# Patient Record
Sex: Female | Born: 1980 | Race: Black or African American | Hispanic: No | Marital: Single | Smoking: Former smoker
Health system: Southern US, Community
[De-identification: ages and names within clinical notes are randomized; demographics above are authoritative.]

## PROBLEM LIST (undated history)

## (undated) DIAGNOSIS — O4690 Antepartum hemorrhage, unspecified, unspecified trimester: Secondary | ICD-10-CM

## (undated) DIAGNOSIS — Z8742 Personal history of other diseases of the female genital tract: Secondary | ICD-10-CM

## (undated) DIAGNOSIS — N870 Mild cervical dysplasia: Secondary | ICD-10-CM

## (undated) DIAGNOSIS — F411 Generalized anxiety disorder: Secondary | ICD-10-CM

## (undated) DIAGNOSIS — Z98891 History of uterine scar from previous surgery: Secondary | ICD-10-CM

## (undated) DIAGNOSIS — Z87448 Personal history of other diseases of urinary system: Secondary | ICD-10-CM

## (undated) DIAGNOSIS — B373 Candidiasis of vulva and vagina: Secondary | ICD-10-CM

## (undated) DIAGNOSIS — Z87440 Personal history of urinary (tract) infections: Secondary | ICD-10-CM

## (undated) DIAGNOSIS — E669 Obesity, unspecified: Secondary | ICD-10-CM

## (undated) DIAGNOSIS — Z8619 Personal history of other infectious and parasitic diseases: Secondary | ICD-10-CM

## (undated) DIAGNOSIS — IMO0002 Reserved for concepts with insufficient information to code with codable children: Secondary | ICD-10-CM

## (undated) DIAGNOSIS — 419620001 Death: Secondary | SNOMED CT

## (undated) DIAGNOSIS — R102 Pelvic and perineal pain: Secondary | ICD-10-CM

## (undated) DIAGNOSIS — Z9889 Other specified postprocedural states: Secondary | ICD-10-CM

## (undated) HISTORY — DX: Generalized anxiety disorder: F41.1

## (undated) HISTORY — DX: History of uterine scar from previous surgery: Z98.891

## (undated) HISTORY — DX: Obesity, unspecified: E66.9

## (undated) HISTORY — DX: Candidiasis of vulva and vagina: B37.3

## (undated) HISTORY — DX: Personal history of other diseases of urinary system: Z87.448

## (undated) HISTORY — DX: Personal history of other diseases of the female genital tract: Z87.42

## (undated) HISTORY — PX: OTHER SURGICAL HISTORY: SHX169

## (undated) HISTORY — DX: Personal history of other infectious and parasitic diseases: Z86.19

## (undated) HISTORY — DX: Death: 419620001

## (undated) HISTORY — DX: Personal history of urinary (tract) infections: Z87.440

## (undated) HISTORY — DX: Reserved for concepts with insufficient information to code with codable children: IMO0002

## (undated) HISTORY — DX: Antepartum hemorrhage, unspecified, unspecified trimester: O46.90

## (undated) HISTORY — DX: Pelvic and perineal pain: R10.2

## (undated) HISTORY — DX: Mild cervical dysplasia: N87.0

## (undated) HISTORY — DX: Other specified postprocedural states: Z98.890

## (undated) DEATH — deceased

---
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
## 2000-07-11 ENCOUNTER — Emergency Department (HOSPITAL_COMMUNITY): Admission: EM | Admit: 2000-07-11 | Discharge: 2000-07-11 | Payer: Self-pay | Admitting: Emergency Medicine

## 2000-09-10 DIAGNOSIS — Z87440 Personal history of urinary (tract) infections: Secondary | ICD-10-CM

## 2000-09-10 DIAGNOSIS — R102 Pelvic and perineal pain: Secondary | ICD-10-CM

## 2000-09-10 DIAGNOSIS — Z87448 Personal history of other diseases of urinary system: Secondary | ICD-10-CM

## 2000-09-10 HISTORY — DX: Personal history of urinary (tract) infections: Z87.440

## 2000-09-10 HISTORY — DX: Personal history of other diseases of urinary system: Z87.448

## 2000-09-10 HISTORY — DX: Pelvic and perineal pain: R10.2

## 2000-12-11 ENCOUNTER — Emergency Department (HOSPITAL_COMMUNITY): Admission: EM | Admit: 2000-12-11 | Discharge: 2000-12-11 | Payer: Self-pay | Admitting: Emergency Medicine

## 2000-12-11 ENCOUNTER — Encounter: Payer: Self-pay | Admitting: Emergency Medicine

## 2000-12-27 DIAGNOSIS — Z8619 Personal history of other infectious and parasitic diseases: Secondary | ICD-10-CM

## 2000-12-27 HISTORY — DX: Personal history of other infectious and parasitic diseases: Z86.19

## 2001-06-27 ENCOUNTER — Emergency Department (HOSPITAL_COMMUNITY): Admission: EM | Admit: 2001-06-27 | Discharge: 2001-06-27 | Payer: Self-pay | Admitting: Emergency Medicine

## 2001-09-29 ENCOUNTER — Inpatient Hospital Stay (HOSPITAL_COMMUNITY): Admission: AD | Admit: 2001-09-29 | Discharge: 2001-09-29 | Payer: Self-pay | Admitting: Obstetrics

## 2001-11-28 ENCOUNTER — Ambulatory Visit (HOSPITAL_COMMUNITY): Admission: RE | Admit: 2001-11-28 | Discharge: 2001-11-28 | Payer: Self-pay | Admitting: *Deleted

## 2002-04-03 ENCOUNTER — Inpatient Hospital Stay (HOSPITAL_COMMUNITY): Admission: AD | Admit: 2002-04-03 | Discharge: 2002-04-06 | Payer: Self-pay | Admitting: *Deleted

## 2003-02-17 ENCOUNTER — Inpatient Hospital Stay (HOSPITAL_COMMUNITY): Admission: AD | Admit: 2003-02-17 | Discharge: 2003-02-20 | Payer: Self-pay | Admitting: Obstetrics and Gynecology

## 2003-02-18 ENCOUNTER — Encounter: Payer: Self-pay | Admitting: Obstetrics and Gynecology

## 2003-02-21 ENCOUNTER — Emergency Department (HOSPITAL_COMMUNITY): Admission: EM | Admit: 2003-02-21 | Discharge: 2003-02-21 | Payer: Self-pay | Admitting: Emergency Medicine

## 2003-02-21 ENCOUNTER — Encounter: Payer: Self-pay | Admitting: Emergency Medicine

## 2003-02-22 ENCOUNTER — Inpatient Hospital Stay (HOSPITAL_COMMUNITY): Admission: AD | Admit: 2003-02-22 | Discharge: 2003-02-23 | Payer: Self-pay | Admitting: Family Medicine

## 2003-02-22 ENCOUNTER — Inpatient Hospital Stay (HOSPITAL_COMMUNITY): Admission: EM | Admit: 2003-02-22 | Discharge: 2003-02-22 | Payer: Self-pay | Admitting: Psychiatry

## 2003-02-22 ENCOUNTER — Encounter: Payer: Self-pay | Admitting: Obstetrics & Gynecology

## 2003-02-23 ENCOUNTER — Encounter: Payer: Self-pay | Admitting: Obstetrics and Gynecology

## 2003-02-23 ENCOUNTER — Inpatient Hospital Stay (HOSPITAL_COMMUNITY): Admission: EM | Admit: 2003-02-23 | Discharge: 2003-02-26 | Payer: Self-pay | Admitting: Psychiatry

## 2003-03-02 ENCOUNTER — Inpatient Hospital Stay (HOSPITAL_COMMUNITY): Admission: EM | Admit: 2003-03-02 | Discharge: 2003-03-04 | Payer: Self-pay | Admitting: Emergency Medicine

## 2003-03-23 ENCOUNTER — Encounter: Admission: RE | Admit: 2003-03-23 | Discharge: 2003-03-23 | Payer: Self-pay | Admitting: Internal Medicine

## 2003-10-14 ENCOUNTER — Other Ambulatory Visit: Admission: RE | Admit: 2003-10-14 | Discharge: 2003-10-14 | Payer: Self-pay | Admitting: Obstetrics and Gynecology

## 2003-12-09 ENCOUNTER — Other Ambulatory Visit: Admission: RE | Admit: 2003-12-09 | Discharge: 2003-12-09 | Payer: Self-pay | Admitting: Obstetrics and Gynecology

## 2004-07-06 ENCOUNTER — Other Ambulatory Visit: Admission: RE | Admit: 2004-07-06 | Discharge: 2004-07-06 | Payer: Self-pay | Admitting: Obstetrics and Gynecology

## 2004-08-27 ENCOUNTER — Emergency Department (HOSPITAL_COMMUNITY): Admission: EM | Admit: 2004-08-27 | Discharge: 2004-08-27 | Payer: Self-pay | Admitting: Emergency Medicine

## 2004-09-12 ENCOUNTER — Ambulatory Visit (HOSPITAL_COMMUNITY): Admission: RE | Admit: 2004-09-12 | Discharge: 2004-09-12 | Payer: Self-pay | Admitting: Obstetrics and Gynecology

## 2005-02-10 ENCOUNTER — Inpatient Hospital Stay (HOSPITAL_COMMUNITY): Admission: AD | Admit: 2005-02-10 | Discharge: 2005-02-12 | Payer: Self-pay | Admitting: Obstetrics & Gynecology

## 2006-03-29 DIAGNOSIS — Z8742 Personal history of other diseases of the female genital tract: Secondary | ICD-10-CM

## 2006-03-29 HISTORY — DX: Personal history of other diseases of the female genital tract: Z87.42

## 2006-03-31 ENCOUNTER — Emergency Department (HOSPITAL_COMMUNITY): Admission: EM | Admit: 2006-03-31 | Discharge: 2006-04-01 | Payer: Self-pay | Admitting: Emergency Medicine

## 2006-04-01 ENCOUNTER — Emergency Department (HOSPITAL_COMMUNITY): Admission: EM | Admit: 2006-04-01 | Discharge: 2006-04-02 | Payer: Self-pay | Admitting: Emergency Medicine

## 2006-04-06 ENCOUNTER — Emergency Department (HOSPITAL_COMMUNITY): Admission: EM | Admit: 2006-04-06 | Discharge: 2006-04-06 | Payer: Self-pay | Admitting: Emergency Medicine

## 2006-04-08 ENCOUNTER — Inpatient Hospital Stay (HOSPITAL_COMMUNITY): Admission: AD | Admit: 2006-04-08 | Discharge: 2006-04-15 | Payer: Self-pay | Admitting: Psychiatry

## 2006-04-09 ENCOUNTER — Ambulatory Visit: Payer: Self-pay | Admitting: Psychiatry

## 2006-04-29 ENCOUNTER — Encounter: Payer: Self-pay | Admitting: Emergency Medicine

## 2006-04-30 ENCOUNTER — Inpatient Hospital Stay (HOSPITAL_COMMUNITY): Admission: RE | Admit: 2006-04-30 | Discharge: 2006-05-13 | Payer: Self-pay | Admitting: *Deleted

## 2006-04-30 ENCOUNTER — Ambulatory Visit: Payer: Self-pay | Admitting: Psychiatry

## 2006-05-03 ENCOUNTER — Encounter: Payer: Self-pay | Admitting: *Deleted

## 2006-05-05 ENCOUNTER — Encounter: Payer: Self-pay | Admitting: *Deleted

## 2006-08-03 ENCOUNTER — Emergency Department (HOSPITAL_COMMUNITY): Admission: EM | Admit: 2006-08-03 | Discharge: 2006-08-03 | Payer: Self-pay | Admitting: Emergency Medicine

## 2006-08-10 DIAGNOSIS — B3731 Acute candidiasis of vulva and vagina: Secondary | ICD-10-CM

## 2006-08-10 DIAGNOSIS — B373 Candidiasis of vulva and vagina: Secondary | ICD-10-CM

## 2006-08-10 HISTORY — DX: Candidiasis of vulva and vagina: B37.3

## 2006-08-10 HISTORY — DX: Acute candidiasis of vulva and vagina: B37.31

## 2009-09-10 DIAGNOSIS — R87619 Unspecified abnormal cytological findings in specimens from cervix uteri: Secondary | ICD-10-CM

## 2009-09-10 DIAGNOSIS — IMO0002 Reserved for concepts with insufficient information to code with codable children: Secondary | ICD-10-CM

## 2009-09-10 HISTORY — DX: Unspecified abnormal cytological findings in specimens from cervix uteri: R87.619

## 2009-09-10 HISTORY — DX: Reserved for concepts with insufficient information to code with codable children: IMO0002

## 2010-03-21 ENCOUNTER — Emergency Department (HOSPITAL_COMMUNITY): Admission: EM | Admit: 2010-03-21 | Discharge: 2010-03-21 | Payer: Self-pay | Admitting: Emergency Medicine

## 2010-07-02 ENCOUNTER — Emergency Department (HOSPITAL_COMMUNITY): Admission: EM | Admit: 2010-07-02 | Discharge: 2010-07-02 | Payer: Self-pay | Admitting: Emergency Medicine

## 2010-07-07 ENCOUNTER — Emergency Department (HOSPITAL_COMMUNITY): Admission: EM | Admit: 2010-07-07 | Discharge: 2010-07-07 | Payer: Self-pay | Admitting: Emergency Medicine

## 2010-11-22 LAB — URINE CULTURE
Colony Count: NO GROWTH
Culture: NO GROWTH

## 2010-11-22 LAB — BASIC METABOLIC PANEL
BUN: 11 mg/dL (ref 6–23)
Calcium: 9.7 mg/dL (ref 8.4–10.5)
GFR calc non Af Amer: >60 mL/min (ref >60)
Potassium: 3.5 mEq/L (ref 3.5–5.1)
Sodium: 134 mEq/L — ABNORMAL LOW (ref 135–145)

## 2010-11-22 LAB — HCG, QUANTITATIVE, PREGNANCY: hCG, Beta Chain, Quant, S: 133721 m[IU]/mL — ABNORMAL HIGH (ref <5)

## 2010-11-22 LAB — URINALYSIS, ROUTINE W REFLEX MICROSCOPIC
Bilirubin Urine: NEGATIVE
Hgb urine dipstick: NEGATIVE
Protein, ur: NEGATIVE mg/dL
Urobilinogen, UA: 0.2 mg/dL (ref 0.0–1.0)

## 2010-11-22 LAB — DIFFERENTIAL
Basophils Absolute: 0 10*3/uL (ref 0.0–0.1)
Lymphocytes Relative: 37 % (ref 12–46)
Monocytes Absolute: 0.4 10*3/uL (ref 0.1–1.0)
Neutro Abs: 4.1 10*3/uL (ref 1.7–7.7)
Neutrophils Relative %: 56 % (ref 43–77)

## 2010-11-22 LAB — CBC
HCT: 35.5 % — ABNORMAL LOW (ref 36.0–46.0)
Hemoglobin: 12 g/dL (ref 12.0–15.0)
RDW: 13.7 % (ref 11.5–15.5)
WBC: 7.3 10*3/uL (ref 4.0–10.5)

## 2010-11-22 LAB — ABO/RH: ABO/RH(D): AB POS

## 2010-11-22 LAB — RAPID STREP SCREEN (MED CTR MEBANE ONLY): Streptococcus, Group A Screen (Direct): POSITIVE — AB

## 2011-01-11 ENCOUNTER — Observation Stay (HOSPITAL_COMMUNITY)
Admission: RE | Admit: 2011-01-11 | Discharge: 2011-01-11 | Disposition: A | Discharge status: Home / Self Care | Payer: Medicaid Other | Source: Ambulatory Visit | Attending: Obstetrics and Gynecology | Admitting: Obstetrics and Gynecology

## 2011-01-11 DIAGNOSIS — O321XX0 Maternal care for breech presentation, not applicable or unspecified: Principal | ICD-10-CM | POA: Insufficient documentation

## 2011-01-11 LAB — RPR: RPR Ser Ql: NONREACTIVE

## 2011-01-11 LAB — CBC
Hemoglobin: 11.7 g/dL — ABNORMAL LOW (ref 12.0–15.0)
MCH: 28 pg (ref 26.0–34.0)
MCHC: 32.4 g/dL (ref 30.0–36.0)
Platelets: 223 10*3/uL (ref 150–400)
RDW: 14.9 % (ref 11.5–15.5)

## 2011-01-18 NOTE — Op Note (Signed)
NAMESONDRA, BLIXT                 ACCOUNT NO.:  [id number]  MEDICAL RECORD NO.:  [id number]           PATIENT TYPE:  O  LOCATION:  9172                          FACILITY:  [facility]  PHYSICIAN:  Janine Limbo, M.D.DATE OF BIRTH:  12-08-1980  DATE OF PROCEDURE:  01/11/2011 DATE OF DISCHARGE:  01/11/2011                              OPERATIVE REPORT   PREOPERATIVE DIAGNOSES: 1. A 37 weeks' gestation (estimated date of confinement is Feb 01, 2011). 2. Breech presentation. 3. Body mass index equals 45.  POSTOPERATIVE DIAGNOSES: 1. A 37 weeks' gestation (estimated date of confinement is Feb 01, 2011). 2. Breech presentation. 3. Body mass index equals 45.  PROCEDURES: 1. Unsuccessful external version. 2. Ultrasound.  OBSTETRICIAN:  Janine Limbo, MD  FIRST ASSISTANT:  Renaldo Reel. Emilee Hero, CNM  ANESTHETIC:  None.  DISPOSITION:  Ms. Anagnos is a 30 year old female, gravida 6, para 2-0-3- 2, who presents at 11 weeks' gestation for external version because of a persistent breech presentation.  The patient has been followed at the [facility] and Gynecology Division of [facility].  The patient understands the indications for her surgical procedure as well as her alternative treatment options.  She accepts the risks that there will be fetal stress and that there is the possibility we may need to proceed with cesarean section today.  She also accepts the risk that external version will be unsuccessful.  FINDINGS:  The patient's blood type is AB positive.  An ultrasound was performed that showed a single intrauterine gestation and a breech presentation.  The head was located in the right upper quadrant.  The amniotic fluid volume was normal.  The placenta is a grade 2 and is located at the fundus.  Fetal heart motions were normal.  Nonstress test was reactive.  The patient's body mass index equals 45.  DESCRIPTION OF PROCEDURE:  The  patient was seen in Labor and Delivery. IV access was established.  The patient was given subcutaneous terbutaline.  The nonstress test was reactive.  We reviewed the indications for an attempted external version and we talked about cesarean delivery in the face of fetal stress.  An ultrasound was performed with findings as mentioned above.  Gel was placed in the patient's abdomen.  Two attempts were made to rotate the infant in a counterclockwise direction.  These were unsuccessful.  Two attempts were then made to rotate the infant in a clockwise direction.  These attempts were also unsuccessful.  The patient was monitored with the ultrasound and the fetal heart motions were noted to be stable throughout.  After all 4 attempts, the decision was made to discontinue any further attempts.  The patient was placed back on the monitor and again the nonstress test was noted to be reactive.  The patient did not have any bleeding or leakage of fluid.  FOLLOWUP PLANS:  The patient will take Tylenol 1 or 2 every 6 hours as needed for discomfort.  We discussed scheduling a cesarean section because of our concerns for vaginal breech delivery.  The patient will follow up in the office in 1 week.  She will call for questions or concerns.     Janine Limbo, M.D.     AVS/MEDQ  D:  01/11/2011  T:  01/11/2011  Job:  [id number]  Electronically Signed by Kirkland Hun M.D. on 01/18/2011 03:22:07 AM

## 2011-01-19 ENCOUNTER — Other Ambulatory Visit: Payer: Self-pay | Admitting: Obstetrics and Gynecology

## 2011-01-19 ENCOUNTER — Encounter (HOSPITAL_COMMUNITY): Payer: Medicaid Other

## 2011-01-19 LAB — SURGICAL PCR SCREEN
MRSA, PCR: NEGATIVE
Staphylococcus aureus: NEGATIVE

## 2011-01-19 LAB — CBC
HCT: 34.5 % — ABNORMAL LOW (ref 36.0–46.0)
MCHC: 31.6 g/dL (ref 30.0–36.0)
Platelets: 207 10*3/uL (ref 150–400)
RDW: 14.9 % (ref 11.5–15.5)
WBC: 8.2 10*3/uL (ref 4.0–10.5)

## 2011-01-19 LAB — RPR: RPR Ser Ql: NONREACTIVE

## 2011-01-24 NOTE — H&P (Signed)
  NAMEKHAMILA, BASSINGER                 ACCOUNT NO.:  [id number]  MEDICAL RECORD NO.:  [id number]           PATIENT TYPE:  LOCATION:                                 FACILITY:  PHYSICIAN:  Janine Limbo, M.D.DATE OF BIRTH:  Feb 05, 1981  DATE OF ADMISSION:  01/26/2011 DATE OF DISCHARGE:                             HISTORY & PHYSICAL   HISTORY OF PRESENT ILLNESS:  Ms. Degrasse is a 30 year old female, gravida 6, para 2-0-3-2, who presents at 39 weeks and 1 day gestation (EDC is Feb 01, 2011).  The patient has been followed at the [facility] and Gy[facility]cology Division [facility]. The pregnancy has been complicated by the fact that the patient has a breech infant.  External version was attempted but was not successful.  DRUG ALLERGIES:  No known drug allergies.  OBSTETRICAL HISTORY:  The patient had a vaginal delivery in 2003 at term.  She had a vaginal delivery in 2006 at term.  She has had 1 miscarriage and 2 elective pregnancy terminations.  PAST MEDICAL HISTORY:  The patient is obese.  She denies hypertension and diabetes.  SOCIAL HISTORY:  The patient denies cigarette use, alcohol use, and recreational drug use.  REVIEW OF SYSTEMS:  Normal pregnancy complaints.  FAMILY HISTORY:  Noncontributory.  PHYSICAL EXAMINATION:  VITAL SIGNS:  Weight is 268 pounds, height is 5 feet 5 inches. HEENT:  Within normal limits. CHEST:  Clear. HEART:  Regular rate and rhythm. BREASTS:  Without masses. ABDOMEN:  Gravid with a fundal height of 39 cm.  Infant is in a breech presentation with the head in the right upper quadrant. EXTREMITIES:  Within normal limits. NEUROLOGIC:  Grossly normal. PELVIC:  Cervix was closed and long when last checked.  LABORATORY VALUES:  Blood type is AB positive, antibody screen negative, sickle cell negative, VDRL nonreactive, rubella immune, HBsAg negative, HIV is nonreactive.  Third trimester gonorrhea negative.   Third trimester Chlamydia is negative. Beta Strep is negative.  ASSESSMENT: 1. A 39-week and 1-day gestation. 2. Breech presentation. 3. Obesity.  PLAN:  The patient will undergo a primary low transverse cesarean section.  She requested Nexplanon for contraception.  She understands the indications for her surgical procedure and she accepts the risks of, but not limited to, anesthetic complications, bleeding, infections, and possible damage to surrounding organs.     Janine Limbo, M.D.     AVS/MEDQ  D:  01/22/2011  T:  01/23/2011  Job:  [id number]  Electronically Signed by Kirkland Hun M.D. on 01/24/2011 08:48:55 AM

## 2011-01-26 ENCOUNTER — Inpatient Hospital Stay (HOSPITAL_COMMUNITY)
Admission: RE | Admit: 2011-01-26 | Discharge: 2011-01-29 | DRG: 766 | Disposition: A | Discharge status: Home / Self Care | Payer: Medicaid Other | Source: Ambulatory Visit | Attending: Obstetrics and Gynecology | Admitting: Obstetrics and Gynecology

## 2011-01-26 DIAGNOSIS — Z01812 Encounter for preprocedural laboratory examination: Secondary | ICD-10-CM

## 2011-01-26 DIAGNOSIS — O9903 Anemia complicating the puerperium: Secondary | ICD-10-CM | POA: Diagnosis not present

## 2011-01-26 DIAGNOSIS — E669 Obesity, unspecified: Secondary | ICD-10-CM | POA: Diagnosis present

## 2011-01-26 DIAGNOSIS — O321XX0 Maternal care for breech presentation, not applicable or unspecified: Principal | ICD-10-CM | POA: Diagnosis present

## 2011-01-26 DIAGNOSIS — Z01818 Encounter for other preprocedural examination: Secondary | ICD-10-CM

## 2011-01-26 DIAGNOSIS — D649 Anemia, unspecified: Secondary | ICD-10-CM | POA: Diagnosis not present

## 2011-01-26 NOTE — H&P (Signed)
[facility]  Patient:    [name], [name] Visit Number: [id number] MRN: [id number]          Service Type: OBS Location: 910B 9162 01 Attending Physician:  Jaymes Graff A Dictated by:   Nigel Bridgeman, C.N.M. Admit Date:  04/03/2002                           History and Physical  HISTORY OF PRESENT ILLNESS:   The patient is a 30 year old gravida 3, para 0-0-2-0 at 67 weeks who presented with uterine contractions every five minutes since approximately 4 a.m.  She also began to have a small amount of leaking upon presentation to the hospital.  Pregnancy has been remarkable for #1: Positive group B strep.  2. TABs.  3. History of asthma.  4. Late to care beginning care at 24 weeks.  5. History of Chlamydia, second trimester.  PRENATAL LABORATORIES:        Blood type is AB positive, Rh antibody negative, VDRL nonreactive, rubella titer positive, hepatitis B surface antigen negative,  HIV nonreactive, sickle cell test negative.  GC and Chlamydia cultures were negative.  Pap was normal.  Glucose challenge was normal.  AFP was not done secondary to patient being too late for care.  Hemoglobin upon entering the practice was 12.  It was 10.8 at 27 weeks.  Group B strep culture was positive at 36 weeks.  EDC of April 17, 2002, was established by ultrasound at approximately 23 weeks secondary to late to care.  Her rubella is nonimmune.  HISTORY OF PRESENT PREGNANCY:                    Patient entered care at [facility] at approximately 25 weeks.  She was treated with Rocephin and Zithromax for cervicitis at [facility].  She had an ultrasound at 25 weeks.  She had a Glucola that was normal.  She had another ultrasound at 29 weeks that showed normal growth.  There was limited brain anatomy noted on both ultrasounds. Patient declined this.  Group B strep culture was positive at 36 weeks.  The rest of her pregnancy was essentially  uncomplicated.  OBSTETRICAL HISTORY:          In 1998, she had a miscarriage.  In May 2002, she had a therapeutic termination of pregnancy at three months at [facility].  MEDICAL HISTORY:              She was a condom user.  She has also been an OCP user.  She was diagnosed with asthma in high school.  She has had no current attacks.  She has a history of previous smoking but stopped prior to pregnancy.  She does have a history of pyelonephritis or UTI in the past.  GENETIC HISTORY:              Unremarkable.  SOCIAL HISTORY:               Patient is single.  She has an 11th grade education.  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  She is of the African-American culture.  She is supported by her family.  Her mother, and her partner, [name]. [name], are present with her today.  FAMILY HISTORY:               Paternal grandfather had a stroke.  PHYSICAL EXAMINATION:  VITAL SIGNS:                  Stable.  Patient is afebrile.  HEENT:                        Within normal limits.  LUNGS:                        Bilateral breath sounds are clear.  HEART:                        Regular rate and rhythm without murmur.  BREASTS:                      Soft and nontender.  ABDOMEN:                      Fundal height is approximately 38 cm.  Estimated fetal weight 7 to 7-1/2 pounds.  Uterine contractions are every 5 minutes, mild to moderate quality.  Cervical exam on admission was 2 cm, 80%, vertex, -1, -2, posterior, with questionable membranes felt.  There was a speculum exam done with a small amount of fluid in the vault.  This was negative fern and negative nitrazine initially.  Then, the patient walked for an hour after which time her cervix changed to 3 cm and there was positive ferning noted on exam.  Electronic fetal monitoring reveals reactive fetal heart rate tracing with a negative spontaneous CST and occasional mild variables.  EXTREMITIES:                   Deep tendon reflexes are 2+ without clonus. There is a trace edema noted.  IMPRESSION:                   1. Intrauterine pregnancy at 38 weeks.                               2. Spontaneous rupture of membranes since                                  approximately 8 a.m.                               3. Positive group B strep.                               4. Rubella nonimmune.  PLAN:                         1. Admit to birthing suite for consult with                                  Dr. Jaymes Graff as attending physician.                               2. Routine certified nurse midwife orders.  3. Plan group B strep prophylaxis with                                  penicillin G per standard dosing.                               4. Patient declines epidural but will want to                                  use IV pain medications as appropriate. Dictated by:   Nigel Bridgeman, C.N.M. Attending Physician:  Michael Litter DD:  04/03/02 TD:  04/03/02 Job: [id number]

## 2011-01-26 NOTE — H&P (Signed)
[name], [name]                 ACCOUNT NO.:  [id number]   MEDICAL RECORD NO.:  [id number]          PATIENT TYPE:  INP   LOCATION:  9164                          FACILITY:  [facility]   PHYSICIAN:  Crist Fat. Rivard, M.D. DATE OF BIRTH:  [birth year]   DATE OF ADMISSION:  02/10/2005  DATE OF DISCHARGE:                                HISTORY & PHYSICAL   This is a 30 year old gravida 4, para 1-0-2-1, at 39-4/7 weeks, who presents  with rupture of membranes at 7 a.m. with contractions occurring at the same  time.  Pregnancy has been followed by the nurse midwife service and  remarkable for (1) Abnormal Pap.  (2) First trimester spotting.  (3) Unsure  dates.  (4) First trimester UTI.  (5) Asthma.  (6) Increased BMI.  (7)  History of pyelo.  (8) Positive group B strep.   OBSTETRICAL HISTORY:  1.  Remarkable for a spontaneous abortion in 1998 at [redacted] weeks gestation.  2.  An elective abortion in 2002 at [redacted] weeks gestation.  3.  Vaginal delivery in 2003 of a female infant at [redacted] weeks gestation,      weighing 6 pounds, 1 ounce, remarkable for meconium.   MEDICAL HISTORY:  1.  Anemia with last pregnancy.  2.  History of Chlamydia in 2002.  3.  Low-grade SIL in 2005 with normal Pap afterwards.  4.  Exercise-induced asthma.   SURGICAL HISTORY:  1.  An elective AB in 2002.  2.  I&D of a boil in 2002.   FAMILY HISTORY:  Remarkable for a grandfather with diabetes and colon  cancer.   GENETIC HISTORY:  Unremarkable.   SOCIAL HISTORY:  The patient is single.  Father of the baby, Landry Mellow,  is involved and supportive.  She is of the Jehovah's Witness faith.  She  denies any alcohol, tobacco, or drug use.   PRENATAL LABS:  Hemoglobin 11.3, platelets 270, blood type AB positive,  antibody screen negative, sickle cell negative, RPR nonreactive, rubella  immune, hepatitis negative, HIV negative, Pap test normal, gonorrhea  negative, Chlamydia negative, cystic fibrosis, negative, group B strep  positive.   HISTORY OF CURRENT PREGNANCY:  The patient entered care at [redacted] weeks  gestation.  She had a first trimester UTI that was treated.  She had normal  18 week ultrasound.  Glucola was normal at 26 weeks, and group B strep was  positive at 35 weeks.   OBJECTIVE DATA:  VITAL SIGNS:  Stable and afebrile.  HEENT:  Within normal limits.  Thyroid normal and not enlarged.  CHEST:  Clear to auscultation.  HEART:  Regular rate and rhythm.  ABDOMEN:  Gravid at 39 cm, vertex Leopold's, EFM shows reactive fetal heart  rate with contractions every 2 minutes.  Cervix is 3, 90, -1, with vertex  presentation, clear fluid leaking, positive Nitrazine.  EXTREMITIES:  Within normal limits.   ASSESSMENT:  1.  Intrauterine pregnancy at 39-4/7 weeks.  2.  Spontaneous rupture of membranes.  3.  Active labor.   PLAN:  1.  Admit to birthing suites.  2.  Routine CNM orders.  3.  Penicillin prophylaxis.  4.  Further orders to follow.      MLW/MEDQ  D:  02/10/2005  T:  02/10/2005  Job:  [id number]

## 2011-01-26 NOTE — H&P (Signed)
NAMECHRISANN, [name]                           ACCOUNT NO.:  [id number]   MEDICAL RECORD NO.:  [id number]                   PATIENT TYPE:  IPS   LOCATION:  0501                                 FACILITY:  [facility]   PHYSICIAN:  Jeanice Lim, M.D.              DATE OF BIRTH:  Oct 07, 1980   DATE OF ADMISSION:  02/23/2003  DATE OF DISCHARGE:  02/26/2003                         PSYCHIATRIC ADMISSION ASSESSMENT   IDENTIFYING INFORMATION:  This is a 30 year old African-American female  voluntarily admitted on February 23, 2003.   HISTORY OF PRESENT ILLNESS:  The patient was a transfer from [facility].  Was assessed for depression and abdominal pain and nausea.  Is  status post abortion one week ago which was elective.  She returned to  [facility] for symptoms of depression and anxiety.  States  that she has had an extensive workup for her abdominal pain with negative  results, continuing with nausea, vomiting, abdominal pain and continual  attention.  She denies any depressive symptoms, suicidal ideation or  psychosis.   PAST PSYCHIATRIC HISTORY:  Was admitted to [facility] three  years ago for her nerves.  Denies any auditory or visual hallucinations,  suicidal ideation or outpatient treatment.   SOCIAL HISTORY:  This is a 30 year old single African-American female.  Has  a 32-month-old son.  She lives with her mother.  She has had a previous  abortion at age 60 and had a recent abortion a week ago for financial  reasons.  She worked previously in [employer].  States her mother  supports her financially.   FAMILY HISTORY:  Negative.   ALCOHOL/DRUG HISTORY:  Smokes two cigarettes a day.  Her last alcohol drink  was a month ago.  She does report some drinking, up to a pint of liquor  daily.   PRIMARY CARE PHYSICIAN:  None.   MEDICAL PROBLEMS:  Status post abortion one week ago with continual  complaints of nausea, vomiting and abdominal  pain.   MEDICATIONS:  Cipro 250 mg p.o. q.8h., Phenergan 12.5 mg p.o. q.4h. p.r.n.,  Stadol 1 mg IM q.3h. p.r.n., Toradol 30 mg IM q.6h. p.r.n. for pain.   ALLERGIES:  No known allergies.   PHYSICAL EXAMINATION:  Done at [facility].  The patient walks very  stooped over due to abdominal pain.  Her hCG quantitative was 350 down to  54.  Amylase was 55.  Her CMET was within normal limits.   MENTAL STATUS EXAM:  She is alert.  She is bent over.  She is complaining of  abdominal pain.  Refusing to sit.  Wears a hospital gown.  Speech is clear.  Mood:  The patient reports she is in pain.  Affect flat.  Attention-seeking.  Thought processes are focused on her pain.  Requires bedside attention.  Cognitively, she is oriented.  Memory is intact.  Poor insight.  Fair  judgment.  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  2. Rule out anxiety disorder not otherwise specified.   AXIS II:  Deferred.   AXIS III:  Status post elective abortion.   AXIS IV:  Problems with primary support group, occupation, economic and  other psychosocial problems.   AXIS V:  Current 35; this past year 34.   PLAN:  Voluntary admission for depression and anxiety.  Contract for safety.  Check every 15 minutes.  Will stabilize mood and thinking so patient can be  safe.  Increase coping skills by attending groups.  Will monitoring food and  fluid intake.  Will monitor labs as well for dehydration.  Will initiate  Seroquel for anxiety and mood lability.  Will consider a family session with  the mother.  Will provide patient information as well on family planning.   TENTATIVE LENGTH OF STAY:  Three to five days.     Landry Corporal, N.P.                       Jeanice Lim, M.D.    JO/MEDQ  D:  03/19/2003  T:  03/19/2003  Job:  [id number]

## 2011-01-26 NOTE — Discharge Summary (Signed)
NAMEJESICCA, [name]                 ACCOUNT NO.:  [id number]   MEDICAL RECORD NO.:  [id number]          PATIENT TYPE:  IPS   LOCATION:  0400                          [facility]  [facility]   PHYSICIAN:  Jasmine Pang, M.D. DATE OF BIRTH:  [birth year]   DATE OF ADMISSION:  04/30/2006  DATE OF DISCHARGE:  05/13/2006                                 DISCHARGE SUMMARY   IDENTIFYING INFORMATION:  The patient is a 30 year old, single, African-  American female, who was admitted on a voluntary basis on April 30, 2006,  to my service.   HISTORY OF PRESENT ILLNESS:  The patient has a history of psychosis with  auditory hallucinations.  She states they have been command hallucinations,  telling her to go on.  She reports compliance with medications.  She  denies stressors.  She has been nauseated times two days and not eating  times two days.  She denies suicidal and homicidal ideations.  She denies  current psychosis.  The patient states, my nerves triggered back on me.  She had started hearing voices telling her to kill herself.  She has not  been sleeping or eating.  This is her second [facility] admission for this patient.  For further admission information, see psychiatric admission assessment.   PHYSICAL EXAMINATION:  The patient's physical exam was done by her nurse  practitioner, Landry Corporal, N.P.  She was found to be a healthy, young  female with no acute medical problems.   ADMISSION LABORATORIES:  CBC was within normal limits.  Urine pregnancy test  was negative.  Glucose 104.  Urinalysis:  7-10 WBC.  Urine drug screen was  positive for barbiturates and benzodiazepines.   HOSPITAL COURSE:  Upon admission, the patient was continued on Zoloft 25 mg  p.o. daily, her home medication.  She was also placed on Ativan 2 mg p.o. or  IM now, given her agitation, then 2 mg p.o. or IM every 4 hours p.r.n.  Zyprexa Zydis 10 mg p.o. at bedtime, Zyprexa Zydis 5 mg p.o. every 6 hours  p.r.n. agitation,  Phenergan 25 mg IM now, then Phenergan 25 mg p.o. or IM  every 4 hours p.r.n. nausea.  On May 01, 2006, the patient was started on  Cogentin 1 mg p.o. twice daily p.r.n. EPS and trazodone 50 mg p.o. at  bedtime p.r.n. insomnia, may repeat times one.  She was also started on  Cipro 200 mg p.o. twice daily times three days for her UTI.  On May 02, 2006, the patient was started on Seroquel 200 mg now, may repeat in one hour  if patient is still restless.  On May 02, 2006, patient was given  Benadryl 50 mg IM now for EPS.  On May 04, 2006, patient was started on  Protonix 40 mg daily.  A urine probe for GC and Chlamydia was ordered  because she had been positive for this in the emergency room.  This was  negative.  Due to her continued GI pain, an abdominal ultrasound was done.  This was an unremarkable abdominal ultrasound examination.  It was felt the  abdominal pain was probably due to her UTI.  On May 04, 2006, the patient  had to be placed on one-to-one for increased agitation and inability to  process and being a fall risk.  On May 04, 2006, Zyprexa was  discontinued, Zoloft was discontinued due to her increasing problems with  EPS and confusion.  On May 04, 2006, Benadryl 50 mg IM times one was  given, Ativan 0.5 mg p.o. three times a day at bedtime was given.  On May 04, 2006, Benadryl 50 mg times one now was given for EPS.  On May 05, 2006, the patient was started on Bentyl 10 mg p.o. three times a day p.r.n.  GI distress after her abdominal ultrasound was found to be normal and Zofran  4 mg p.o. or IM every 6 hours p.r.n.  Her Ativan was also changed to 0.5 mg  p.o. three times a day p.r.n. anxiety.  On May 07, 2006, the patient was  started back on Zydis 5 mg now, then every 6 hours p.r.n. anxiety and  agitation.  She was also started on a standing dose of Zyprexa Zydis 5 mg  p.o. at bedtime.  She was started on Ativan 1 mg now.  The patient  tolerated  these medications well with no significant side effects.   Upon first meeting patient, she states, my nerves triggered back on me.  She was hearing voices, telling her to kill herself.  She had not been  sleeping (none in the past three days).  She did not eat lunch and  apparently has not been eating lately.  Affect was flat and constricted.  There was psychomotor agitation.  There was positive suicidal ideation,  stab myself.  She contracted for safety here.  On May 02, 2006, the  patient was sitting in bed, hunched over.  At first, she did not want to  talk.  She covered herself with a blanket.  Later she talked more.  She  stated she was having positive auditory hallucinations, telling her to pay  up.  She states she is anxious also.  There were signs of motor agitation.  She was very restless.  She was having a hard time settling down.   On May 03, 2006, the patient continued to be very restless and fidgety.  She did not sleep.  She asked me, is this a chemical imbalance in my  brain?  She was in a lot of distress and was not sure she was going to get  better.  She was advised that she would indeed get better with treatment.  She stated she felt like her legs would not stop moving.  She was contorting  in bed to different postures.  She stated she hears voices that play games  and tricks and stuff.  They are very derogatory towards her.  On May 05, 2006, the patient required the emergency room for recurrent abdominal pain.  She had bloody-tinged emesis.  A CT scan was done of her abdomen, which was  within normal limits.  It was recommended she use Zofran for her nausea and  Bentyl for the GI distress.  She returned, resting comfortably.  The patient  was also on one-to-one from August 25 to August 26, due to her continued  agitation, inability to process and concern about being a fall risk.  On May 06, 2006, the patient was feeling better.  She stated she  liked  her new meds.  She was started on just Ativan.  The Zoloft and Zyprexa had  been discontinued, due to excessive agitation and EPS.  I decided to restart  a small dose of Zyprexa, because she has not required further treatment with  Benadryl or Cogentin.  I was going to monitor closely for EPS.  On August  28, 200, the patient was lying in bed.  She states she had been up all  night.  The medicine aint working.  She had significant psychomotor  retardation.  She feels like the meds are not helping her nerves.  She is  experiencing sedation.  On May 08, 2008, the patient was still improving.  She sees the improvement in herself.  She was able to converse more  appropriately with good eye contact.  Case manager talked with her mother,  who is very supportive.  She wants [name] well and states she can return  home.  On May 09, 2006, I talked with [name] about her positive GC and  Chlamydia test in the ED.  She was anxious because she felt she may have  contracted again, since she had sex with the same man after the ED visit,  even though she was treated at the ED.  She is quite somatically  preoccupied.  She had a history of conversion disorder in the past.  I  repeated the urine GC and Chlamydia DNA probe and it was  negative.   On May 10, 2006, the patient was anxious.  She wanted to know if she had  GC and Chlamydia.  She felt like she was having the symptoms of burning.  She remained preoccupied with this.  On May 11, 2006, the patient was  alert and cooperative with appropriate affect.  She was able to describe  triggers for her discomfort.  She seems to have little or no insight into  her own illness or behavior.  Her mother is also perplexed.  We considered  discharge on May 12, 2006.  This was canceled, however, because she  thought she needed another day, as she was still feeling anxious.   On May 13, 2006, the patient's affect had improved markedly.  She  had  good eye contact.  She was conversant, friendly, cooperative.  Her speech  was normal rate and flow.  Psychomotor activity was within normal limits.  Mood was less depressed and anxious, though there was still some anxiety.  Affect was wide range.  There was no suicidal and homicidal ideation, no  self-injurious behavior ideation.  There were no auditory or visual  hallucinations, no paranoia or delusions, thoughts were logical and goal-  directed.  Thought content:  Very happy to hear that she did not have  gonorrhea or Chlamydia.  Cognitive exam was grossly within normal limits.   The patient felt ready to go home.  She was going back to live with her  mother and her children.  Mother was very supportive and felt ready to take  the patient home.   DISCHARGE DIAGNOSES:  AXIS I.  Psychotic disorder, NOS (rule out  schizoaffective disorder versus schizophrenia).  AXIS II.  None.  AXIS III. 1. Recent urinary tract infection, which was treated.  2. Recent gastrointestinal discomfort, which was evaluated thoroughly.  AXIS IV.  Severe (problems with primary support group, other psychosocial  problems, medical problems, economic problems).  AXIS V:  GAF upon discharge was 50.  GAF upon admission was 30.  GAF  highest,  past year, was 68.   DISCHARGE PLANS:  There were no specific activity level or dietary  restrictions.   DISCHARGE MEDICATIONS:  1. The patient was discharged on Protonix 40 mg daily or may use Pepcid 20      mg daily (since Medicaid will not pay for Protonix).  2. Zyprexa 5 mg p.o. at bedtime.  3. Trazodone 50 mg p.o. at bedtime p.r.n. insomnia.  4. Hydroxyzine 25 mg one pill p.o. every 6 hours p.r.n. anxiety.   POST-HOSPITAL CARE PLANS:  The patient will go to the [facility] on  Thursday, May 16, 2006, at 10:30 a.m. for followup medication  management.      Jasmine Pang, M.D.  Electronically Signed     BHS/MEDQ  D:  05/13/2006  T:   05/13/2006  Job:  [id number]

## 2011-01-26 NOTE — Discharge Summary (Signed)
NAMEBRINLY, Ebony                           ACCOUNT NO.:  [id number]   MEDICAL RECORD NO.:  [id number]                   PATIENT TYPE:  INP   LOCATION:  5725                                 FACILITY:  [facility]   PHYSICIAN:  [name], M.D.                    DATE OF BIRTH:  01/24/1981   DATE OF ADMISSION:  03/02/2003  DATE OF DISCHARGE:  03/04/2003                                 DISCHARGE SUMMARY   DISCHARGE DIAGNOSES:  1. Nausea and vomiting, resolved.  2. Abdominal pain, resolved.  3. Probable depression.  4. Status post elective abortions.  5. Hypokalemia, resolved.   DISCHARGE MEDICATIONS:  Paxil 20 mg p.o. daily.   CONDITION ON DISCHARGE:  The patient was discharged in excellent condition.  She was able to tolerate oral feeding without problems.   DISCHARGE INSTRUCTIONS:  She was instructed to follow up with [facility] on 03/04/03 at 10 a.m.  She was instructed to follow up in  the [facility] outpatient clinic on 03/23/03 at 2:15 p.m. with Dr. Lianne Jensen.   BRIEF ADMITTING HISTORY AND PHYSICAL EXAMINATION:  [name] is a 30-year-  old African-American female with no significant past medical history who  underwent an elective abortion 17 days prior to admission at [facility].  After the abortion she had recurrent nausea,  vomiting and abdominal pain.  She was evaluated for possible retention of  fetal products, endometritis, uterine perforation, but she was found to have  non of these.  She was imaged with abdominal ultrasound, computed tomography  of her abdomen and pelvis but no major cause was identified.  She was  followed with serial HCG levels which were all trending down after the  abortion.  The patient has also been hospitalized at [facility] for depression  and she had an appointment with [facility] scheduled before  this hospital admission.  Apparently she has been having a week's worth of  progressive nausea, vomiting and abdominal pain with no bowel movement for  several days.  No melena or hematochezia.  No dysuria and no hematuria.   PHYSICAL EXAMINATION ON ADMISSION:  VITAL SIGNS: Temperature 98.3, pulse 95,  respiration 22, Jensen pressure 133/106, saturating 98% on room air.  GENERAL: On examination she was sedated and difficult to interview.  NECK: Supple.  LUNGS: Clear.  HEART: Regular.  ABDOMEN: Supple without rebound tenderness or guarding.  She had positive  bowel sounds.  EXTREMITIES: Without edema, good pulses.  NEUROLOGIC: Exam was nonfocal.   LABORATORY DATA:  Admission urinalysis was negative for nitrite, had 3-6  wbc's, potassium was 2.7, sodium was 173, chloride 97, bicarbonate 23.  Her  alcohol level was less than 5.  Protein level was 3.5 and she had normal  liver function tests.  Creatinine was 1 and BUN was 8.  She had a normal  complete Jensen count and a normal lipase.   HOSPITAL COURSE:  PROBLEM 1.  Nausea and vomiting.  This was a psychogenic  event.  We treated her symptomatically and kept her without diet for 12  hours.  Then we gradually restarted her diet and with the aid of Compazine  p.r.n. we achieved perfect control of her nausea and vomiting.  By the time  of discharge the patient was able to tolerate oral feedings without  problems.  PROBLEM 2.  Hypokalemia secondary to nausea and vomiting.  She had her  potassium replaced adequately and there was no other rebound of her  hypokalemia in the hospital.  PROBLEM 3.  Depression with hospitalization as above.  She has an intake  interview with __________ mental health and we started her on  antidepressants.  She will be followed at the [facility].  PROBLEM 4.  Unplanned pregnancy.  We have advised patient to use  contraceptives from now on and have protected sex.  She had been tested in  the past specifically for gonorrhea and chlamydia, trichomonas and she was  negative  for all of those.   LABORATORY DATA AT TIME OF DISCHARGE:  Sodium 134, potassium 3.5, chloride  99, bicarbonate ___ and magnesium 6.3, BUN 3, creatinine 0.9, glucose 118,  potassium 9.  WBC was 6900, hemoglobin 13.8, hematocrit 20.8 and platelets  259,000.                                               [name], M.D.    SL/MEDQ  D:  03/04/2003  T:  03/06/2003  Job:  [id number]   cc:   [name], M.D.  Int. Med. - Resident - [address]  [place], [place] [address]  Fax: [phone]    cc:   [name], M.D.  Int. Med. - Resident - [address]  [place], [place] [address]  Fax: [phone]

## 2011-01-26 NOTE — Discharge Summary (Signed)
NAMEGERYL, DOHN                 ACCOUNT NO.:  [id number]   MEDICAL RECORD NO.:  [id number]          PATIENT TYPE:  IPS   LOCATION:  0305                          [facility]ILITY[facility]  [facility]   PHYSICIAN:  Anselm Jungling, MD  DATE OF BIRTH:  1980-10-18   DATE OF ADMISSION:  04/08/2006  DATE OF DISCHARGE:  04/13/2006                                 DISCHARGE SUMMARY   IDENTIFYING DATA/REASON FOR ADMISSION:  The patient is a 30 year old single  African American female who reported that she had increasing anxiety,  agitation, and suicidal ideation following a recent medical procedure  emergency room visit at [facility].  Please refer to the  admission note for further details pertaining to the symptoms, circumstances  and history that led to her hospitalization.   INITIAL DIAGNOSTIC IMPRESSION:  She was given an initial AXIS I diagnosis of  psychosis not otherwise specified, rule out.   MEDICAL/LABORATORY:  The patient was medically and physically assessed by  the psychiatric nurse practitioner.  She was in good health without any  active or chronic medical problems.  There were mild elevations of AST which  normalized on the second hospital day, and a very slight elevation of ALT at  45.  Otherwise, CBC and chemistry panel were within normal limits.  There  were no significant medical issues.   HOSPITAL COURSE:  The patient was admitted to the adult inpatient  psychiatric service.  She presented as a well-nourished, well-developed  female who was initially interviewed in her bed.  She was groggy and unable  to give much history on that day, and it was difficult to assess her mental  status.  She was not particularly cooperative either.   On the following day, however, the patient was up, dressed, and active in  the milieu.  She appeared a bit sleepy and sedated, but she denied feeling  that way.  She stated that she felt much better than when I came here.  She discussed  her views on how her various stressors at home contributed to  the crisis that brought her to [place].  She had been mildly febrile, but the  nurse practitioner had been tracking her vital signs and she was not febrile  on the third hospital day.   The patient was begun on a trial of low-dose Risperdal, and this appeared to  hasten the clearing of cognitive and mental symptoms that had led to her  hospitalization and that had been noted in part upon admission.   On the fourth hospital day, the patient had a brief episode characterized by  histrionic affect, writhing on her bed.  This was witnessed by the nurse  practitioner directly, who felt it was clearly not seizure activity, but  more likely some form of conversion phenomenon.  The day following, the  patient was up, active in the milieu and pleasant.  When asked about  yesterday's episode, the patient stated that she believed it was due to  something I ate.  She reported that, on that day, she felt fine, much  better,  with no complaints.  She made no statements suggestive of  delusionality.  However, it was not clear whether there was or was not some  subtle underlying psychosis.   On the next hospital day, the patient appeared more clear cognitively, was  appropriate and pleasant.  She had had no further episodes of dyscontrol.  The patient was motivated to be able to go home and she was indicated that  we would want to see her absent such episodes for 48 hours for considering  her discharge.   On the seventh hospital day, the patient indicated that she felt much  better.  She had no episodes of unusual symptoms for 24 hours.  She was up  and about and attending all groups.  She indicated that she felt ready to  advance to a dual disorder program.  The following day, the patient was felt  to be appropriate for discharge.   AFTERCARE:  The patient was to follow-up at the [facility] with an  appointment on April 18, 2006.    DISCHARGE MEDICATIONS:  1. Risperdal 1 mg b.i.d.  2. Zoloft 25 mg daily.   The patient was ordered to not take phentermine, a drug that she had been  taking prior to admission.   DISCHARGE DIAGNOSIS:  AXIS I:  Brief psychotic disorder, resolving.  Mood  disorder not otherwise specified.  AXIS II:  Deferred.  AXIS III:  No acute or chronic illnesses.  AXIS IV:  Stressors:  Severe.  AXIS V:  GAF on discharge 65.      Anselm Jungling, MD  Electronically Signed     SPB/MEDQ  D:  04/22/2006  T:  04/22/2006  Job:  [id number]

## 2011-01-26 NOTE — Discharge Summary (Signed)
[name], [name]                             ACCOUNT NO.:  [id number]   MEDICAL RECORD NO.:  [id number]                   PATIENT TYPE:  INP   LOCATION:  9326                                 FACILITY:  [facility]   PHYSICIAN:  Miguel Aschoff, M.D.                    DATE OF BIRTH:  04-24-1981   DATE OF ADMISSION:  02/18/2003  DATE OF DISCHARGE:  02/20/2003                                 DISCHARGE SUMMARY   ADMISSION DIAGNOSIS:  Post abortal endometritis.   FINAL DIAGNOSIS:  Post abortal endometritis.   BRIEF HISTORY:  The patient is a 30 year old black female who underwent  elective termination of pregnancy on February 12, 2003, at the [facility].  The patient initially did well, but approximately 48 hours  after the procedure developed the onset of lower abdominal pain, light  bleeding, and persistent nausea and vomiting.  The patient was evaluated at  [facility].  The patient was noted to have abdominal tenderness.  She was afebrile.  White count was within normal limits.  Again, she  described marked abdominal pain, nausea, and vomiting.  She was transcribed  to the [facility] for further evaluation.  Ultrasound examination was  carried out to rule out retained products of conception.  No definite  retained products of conception were noted.  The endometrium was thickened,  but it was felt to be consistent with post abortal state.  The only other  remarkable finding was a 17 mm follicle on the left ovary.  There was no  sign of intra-abdominal hematoma or free peritoneal fluid or sign of ectopic  pregnancy.  The patient was admitted, started on intravenous antibiotics  with Cefotan 2 grams IV q.12h, analgesics, and medications for nausea.  She  remained afebrile.  White count also remained normal.  hCG values that were  predicted status post TAB, and in approximately 48 hours the patient was  feeling better, tolerating a regular diet, had marked  improvement of her  lower abdominal pain, and was felt to be safe to be discharged home.  Medications for home included Cipro 250 mg twice a day x7 days, Darvocet-N  100 as needed for pain, and Zofran 8 mg ODT p.r.n. nausea.  The patient was  instructed to place nothing in the vagina for 3 weeks and call if there are  any problems such as renewed nausea, vomiting, severe pain, or heavy  bleeding.  The patient will be seen back in approximately 1 week for  followup examination.                                               Miguel Aschoff, M.D.    AR/MEDQ  D:  02/20/2003  T:  02/20/2003  Job:  [id number]

## 2011-01-26 NOTE — Discharge Summary (Signed)
[name], [name]                           ACCOUNT NO.:  [id number]   MEDICAL RECORD NO.:  [id number]                   PATIENT TYPE:  IPS   LOCATION:  0501                                 FACILITY:  [facility]   PHYSICIAN:  Jeanice Lim, M.D.              DATE OF BIRTH:  [birth year]   DATE OF ADMISSION:  02/23/2003  DATE OF DISCHARGE:  02/26/2003                                 DISCHARGE SUMMARY   IDENTIFYING DATA:  This is a 30 year old African-American single female  voluntarily admitted.  She was transferred from [facility] after  complaining of abdominal pain and nausea status post an elective therapeutic  abortion procedure.  She was complaining of severe nausea and vomiting.  She  was medically cleared by [facility].  She was reporting feeling  suicidal due to the pain at [facility] but denied this here.  She just  wanted the pain controlled.   MEDICATIONS:  1. Cipro.  2. Phenergan.  3. Stadol.  4. Toradol.   DRUG ALLERGIES:  No known drug allergies.   PHYSICAL EXAMINATION:  GENERAL:  Essentially within normal limits.  Physical  examination was positive for abdominal pain.  The patient was stooped over  due to severity of pain.  No rebound tenderness.  NEUROLOGIC:  Nonfocal.   MENTAL STATUS EXAM:  Alert, in distress secondary to pain, as per the  patient.  Hygiene: Fair.  Speech: Clear.  Mood:Dysphoric secondary to pain.  Affect: Flat, somewhat attention-seeking and dramatic.  Thought process:  Goal directed, preoccupied with pain and wanting immediate fix.  Cognitive:  Intact.  Judgment and insight: Fair to poor.   ADMISSION DIAGNOSES:   AXIS I:  1. Depressive disorder, not otherwise specified.  2. Rule out anxiety disorder, not otherwise specified.   AXIS II:  Deferred.   AXIS III:  Status post elective abortion.   AXIS IV:  Moderate problems with primary support group, occupation, economic  problems.   AXIS V:  [id number]   HOSPITAL  COURSE:  The patient was admitted, ordered routine p.r.n.  medications, underwent further monitoring, and was encouraged to participate  in individual, group, and milieu therapy.  The patient continued to complain  of nausea, abdominal pain, not eating.  The patient was requesting frequent  IM medications, reporting nausea and vomiting, unable to keep down food  initially.  Medications were simplified and IM was changed to p.o. to  discontinue frequent IM.  The patient was not cooperating with treatment,  not participating in groups, and was requesting pain medicine only.  It  appeared that the patient was worsening since getting this treatment.  Therefore, recommendation for longer-term inpatient evaluation and  [facility].  The patient was agreeable.   CONDITION ON TRANSFER:  The patient's condition at transfer was unimproved.  The patient was no acutely suicidal but demanded pain medicine and appeared  to be worsening with supportive treatment.  The patient was to continue on  Zyprexa 2.5 mg q.a.m., 3 p.m., and 10 mg q.h.s. due to level of agitation  and distress and possible somatic, depressive, agitated, questionably  delusional component for further evaluation and stabilization at [facility].   DISCHARGE DIAGNOSES:   AXIS I:  1. Depressive disorder, not otherwise specified.  2. Rule out anxiety disorder, not otherwise specified.   AXIS II:  Deferred.   AXIS III:  Status post elective abortion.   AXIS IV:  Moderate problems with primary support group, occupation, economic  problems.   AXIS V:  Global assessment of functioning on discharge was 35.                                               Jeanice Lim, M.D.    JEM/MEDQ  D:  04/01/2003  T:  04/02/2003  Job:  [id number]

## 2011-01-26 NOTE — Discharge Summary (Signed)
   [name], [name]                           ACCOUNT NO.:  [id number]   MEDICAL RECORD NO.:  [id number]                   PATIENT TYPE:  IPS   LOCATION:  0506                                 FACILITY:  [facility]   PHYSICIAN:  Jeanice Lim, M.D.              DATE OF BIRTH:  1980/10/01   DATE OF ADMISSION:  02/22/2003  DATE OF DISCHARGE:  02/22/2003                                 DISCHARGE SUMMARY   IDENTIFYING DATA:  This is a 30 year old single female voluntarily admitted  presenting complaining of abdominal pain, elective abortion one week ago,  six weeks gestation.  She could not afford having her baby.  She described  severe continuous sharp contractions, vomiting, suicidal ideation due to the  pain since it will not stop.  The patient continued to complain of  escalating pain.   MEDICATIONS:  None.   ALLERGIES:  No known drug allergies.   PHYSICAL EXAMINATION:  Essentially within normal limits.  Neurologically  nonfocal.   ROUTINE ADMISSION LABS:  Cancelled.   MENTAL STATUS EXAM:  The patient rocking, unable to clearly evaluate,  complaining of severe pain, reporting suicidal ideation due to the severity  of pain, unable to evaluate.  Otherwise, no clear psychotic symptoms.   ADMITTING DIAGNOSES:   AXIS I:  Possible anxiety disorder, not otherwise specified.   AXIS II:  Deferred.   AXIS III:  Acute abdominal pain, status post elective abortion.   AXIS IV:  Limited support system.   AXIS V:  30/60.   The patient was admitted, underwent monitoring with attempts to control  pain, and nausea were made initially, but due to the severity of the pain  complaints, the patient appeared to require further medical evaluation and  stabilization before psychiatric condition could be even evaluated,  therefore the patient was transferred to [facility] for management and  admitted there.   DISCHARGE DIAGNOSES:   AXIS I:  Possible anxiety disorder, not otherwise  specified.   AXIS II:  Deferred.   AXIS III:  Acute abdominal pain, status post elective abortion.   AXIS IV:  Limited support system.   AXIS V:  1.   The patient will be evaluated for further psych treatment after she was  medically stabilized.                                                Jeanice Lim, M.D.    JEM/MEDQ  D:  03/23/2003  T:  03/24/2003  Job:  [id number]

## 2011-01-27 LAB — CBC
MCH: 27.5 pg (ref 26.0–34.0)
MCHC: 31.6 g/dL (ref 30.0–36.0)
Platelets: 176 10*3/uL (ref 150–400)
RDW: 15 % (ref 11.5–15.5)

## 2011-01-27 NOTE — Op Note (Signed)
[name], [name]                 ACCOUNT NO.:  [id number]  MEDICAL RECORD NO.:  [id number]           PATIENT TYPE:  I  LOCATION:  9110                          FACILITY:  [facility]  PHYSICIAN:  Janine Limbo, M.D.DATE OF BIRTH:  1981/05/14  DATE OF PROCEDURE:  01/26/2011 DATE OF DISCHARGE:                              OPERATIVE REPORT   PREOPERATIVE DIAGNOSES: 1. A 39 weeks and 1 day gestation. 2. Breech presentation. 3. Obesity (body mass index equals 44.6).  POSTOPERATIVE DIAGNOSIS: 1. A 39 weeks and 1 day gestation. 2. Breech presentation. 3. Obesity (body mass index equals 44.6).  PROCEDURE:  Primary low transverse cesarean section.  SURGEON:  Janine Limbo, MD  FIRST ASSISTANT:  Anabel Halon, [facility], CNM.  ANESTHETIC:  Spinal.  DISPOSITION:  [name] is a 30 year old female, gravida 6, para 2-0-3- 2, who presents at 31 weeks and 1 day gestation (EDC is Feb 01, 2011). The patient has been followed at the [facility] and Gynecology Division of [facility].  The patient had an attempted external version because of her breech infant.  The version was not successful.  She understands the indications for her surgical procedure and she accepts the risks of, but not limited to, anesthetic complications, bleeding, infection, and possible damage to the surrounding organs.  FINDINGS:  A 6-pound and 12-ounce female infant [name]) was delivered from a cephalic presentation.  The Apgar scores were 8 at 1 minute and 9 at 5 minutes.  The uterus, fallopian tubes, and the ovaries appeared normal for the gravid state.  The infant was in a complete breech presentation.  DESCRIPTION OF PROCEDURE:  The patient was taken to the operating room where a spinal anesthetic was given.  The patient's abdomen was prepped with ChloraPrep.  Her perineum and vagina were prepped with Betadine.  A Foley catheter was placed in the bladder.  The patient was  sterilely draped.  The lower abdomen was injected with 10 mL of 0.5% Marcaine with epinephrine.  A low-transverse incision was made in the abdomen and carried sharply through the subcutaneous tissue, fascia, and the anterior peritoneum.  An incision was made in the lower uterine segment. The incision was extended in a low-transverse fashion.  The infant was delivered from a complete breech presentation.  The Apgar scores were 8 at 1 minute and 9 at 5 minutes.  The cord was clamped and cut.  The infant was handed to the awaiting pediatric team.  The placenta was removed and given to the cord blood registry team.  The uterine cavity was cleaned of amniotic fluid, clotted blood, and membranes.  The uterine incision was closed using a running locking suture of 2-0 Vicryl followed by an imbricating suture of 2-0 Vicryl.  Hemostasis was adequate.  The pelvis was vigorously irrigated.  The anterior peritoneum and abdominal musculature were reapproximated in the midline.  The fascia was closed using a running suture of 0 Vicryl followed by 3 interrupted sutures of 0 Vicryl.  The subcutaneous layer was closed using interrupted sutures of 0 Vicryl.  The skin was reapproximated using  a 3-0 suture of Monocryl.  Sponge, needle, and instrument counts were correct on 2 occasions.  Estimated blood loss for the procedure was 800 mL.  The patient tolerated her procedure well.  She was transported to the recovery room in stable condition.  The infant was taken to the full-term nursery in stable condition.     Janine Limbo, M.D.     AVS/MEDQ  D:  01/26/2011  T:  01/26/2011  Job:  [id number]  Electronically Signed by Kirkland Hun M.D. on 01/27/2011 04:18:06 PM

## 2011-01-28 LAB — CCBB MATERNAL DONOR DRAW

## 2011-02-26 ENCOUNTER — Inpatient Hospital Stay (HOSPITAL_COMMUNITY)
Admission: AD | Admit: 2011-02-26 | Discharge: 2011-02-26 | Disposition: A | Discharge status: Home / Self Care | Payer: Medicaid Other | Source: Ambulatory Visit | Attending: Obstetrics and Gynecology | Admitting: Obstetrics and Gynecology

## 2011-02-26 DIAGNOSIS — F4321 Adjustment disorder with depressed mood: Secondary | ICD-10-CM | POA: Insufficient documentation

## 2011-02-26 DIAGNOSIS — Y838 Other surgical procedures as the cause of abnormal reaction of the patient, or of later complication, without mention of misadventure at the time of the procedure: Secondary | ICD-10-CM | POA: Insufficient documentation

## 2011-02-26 DIAGNOSIS — T8140XA Infection following a procedure, unspecified, initial encounter: Secondary | ICD-10-CM | POA: Insufficient documentation

## 2011-02-26 LAB — CBC
HCT: 35.8 % — ABNORMAL LOW (ref 36.0–46.0)
MCHC: 31.8 g/dL (ref 30.0–36.0)
Platelets: 316 10*3/uL (ref 150–400)
RDW: 15.1 % (ref 11.5–15.5)
WBC: 10.4 10*3/uL (ref 4.0–10.5)

## 2011-03-01 LAB — WOUND CULTURE

## 2011-03-25 NOTE — Discharge Summary (Signed)
[name], BERKO                 ACCOUNT NO.:  [id number]  MEDICAL RECORD NO.:  [id number]  LOCATION:  9110                          FACILITY:  [facility]  PHYSICIAN:  Crist Fat. Rivard, M.D. DATE OF BIRTH:  13-Mar-1981  DATE OF ADMISSION:  01/26/2011 DATE OF DISCHARGE:  01/29/2011                              DISCHARGE SUMMARY   ADMITTING DIAGNOSES: 1. Intrauterine pregnancy at 39 weeks and 1 day. 2. Obese. 3. Third trimester anemia. 4. Breech presentation. 5. Jehovah's Witness.  DISCHARGE DIAGNOSES: 1. Stable postoperative day #3, status post a primary low transverse     cesarean section with findings viable female infant named [place]     born Jan 26, 2011, at 0925 a.m. with birth weight 6 pounds and 12     ounces, complete breech presentation, and Apgars 8 and 9. 2. Lactating. 3. Obese. 4. Anemia without signs or symptoms. 5. Desires early return to work.  HOSPITAL PROCEDURES: 1. Spinal anesthesia. 2. Primary low transverse cesarean section for breech presentation.  HOSPITAL COURSE:  [name] is a 30 year old, gravida 6, para 2-0-3-2 who presented on the date of admission at 39 weeks and 1 day per an [facility] of Feb 01, 2011, for a scheduled primary low transverse cesarean section secondary to breech presentation.  The patient had been followed by [facility] and Gynecology, a division of [facility].  Pregnancy had been complicated by persistent breech presentation in third trimester and external cephalic version was attempted on Jan 11, 2011, but unsuccessful.  On the date of admission, the patient verbalized understanding of recommendation for a cesarean delivery secondary to malpresentation. Risks, benefits, and alternatives were reviewed including risks of C- section which included risks of, but not limited to infection, bleeding, damage to surrounding organs, and anesthesia complications.  Following this discussion, the patient did verbalize  understanding and agreeable to proceed with cesarean section for delivery.  She was prepped and taken to the OR and admitted under Dr. Leonard Schwartz.  The procedure was done by attending, Dr. Leonard Schwartz, assisted by Anabel Halon, certified nurse midwife under spinal anesthesia and findings were a viable female infant born in complete breech presentation on Jan [birth year], 2011, at 9:25 a.m., female named [name], birth weight 6 pounds and 12 ounces and Apgars were 8 at 1 minute and 9 at 5 minutes.  Please see Dr. Debria Garret operative dictation for any further details.  The patient did tolerate the procedure well and was transferred to PACU in good condition and infant was taken to [facility] in stable condition. On postoperative day #1, which was Jan 27, 2011, the patient was doing well, voiding well after Foley was discontinued, was ambulating, adequate pain control with PCA.  She was afebrile.  Vital signs were stable.  Nursing was going well.  Positive bowel sounds.  Positive flatulence.  Dressing was clean, dry, and intact.  Fundus was firm, small rubra lochia.  CBC that day did show white count was 7.2, hemoglobin was down to 8.9, hematocrit 28.2, platelets were stable at 176.  PCA was discontinued that day to progress to p.o. pain medicine and otherwise routine postoperative C-section  care continued.  She did have lactation consult. On Jan 28, 2011, which was postoperative day #2; the patient was feeling well, ambulating, breastfeeding was established, some difficulty with latch secondary to large nipples, and was verbalizing plan for Nexplanon postpartum.  She remained afebrile.  Vital signs were stable.  Her physical exam was within normal limits and the patient had been started on iron b.i.d. postpartum, but was without hemodynamic instability.  By postoperative #3, which was Jan 29, 2011, the patient was still doing well.  She was ready for discharge, nursing  without difficulty, up ad lib, and tolerating p.o's.  Remained afebrile and physical exam remained within normal limits.  Incision was open to air, clean, dry, and intact. Fundus was firm below umbilicus.  The patient was deemed to have received full benefit of the hospital stay and was discharged home in stable condition on postop day #3 which was Jan 29, 2011.  DISCHARGE INSTRUCTIONS:  Per [facility] pamphlet.  Warning signs and symptoms to report were reviewed.  She was also given iron-rich food sheet as well as postpartum depression pamphlet.  DISCHARGE MEDICATIONS: 1. Motrin 600 mg p.o. q.6 h. p.r.n. pain. 2. Percocet 5/325 one tablet p.o. q.3 h. or 2 tablets p.o. q.6 h.     p.r.n. moderate to severe pain. 3. She was to continue her Integra iron supplement 1 tablet p.o. daily     and was to obtain stool softener of choice over-the-counter.  DISCHARGE FOLLOWUP:  To occur in 6 weeks at [facility] or p.r.n.  The patient was instructed that if she would like to indeed return to work earlier than the customary 6-8 weeks maternity leave, she was to schedule her postpartum followup earlier. The patient was also to refrain from intercourse until Nexplanon inserted or use condoms.     [name] [place], [place]   ______________________________ Crist Fat Rivard, M.D.    CHS/MEDQ  D:  03/24/2011  T:  03/25/2011  Job:  [id number]

## 2011-04-02 ENCOUNTER — Emergency Department (HOSPITAL_COMMUNITY): Payer: Medicaid Other

## 2011-04-02 ENCOUNTER — Emergency Department (HOSPITAL_COMMUNITY)
Admission: EM | Admit: 2011-04-02 | Discharge: 2011-04-02 | Disposition: A | Discharge status: Home / Self Care | Payer: Medicaid Other | Attending: Emergency Medicine | Admitting: Emergency Medicine

## 2011-04-02 DIAGNOSIS — F17200 Nicotine dependence, unspecified, uncomplicated: Secondary | ICD-10-CM | POA: Insufficient documentation

## 2011-04-02 DIAGNOSIS — R079 Chest pain, unspecified: Secondary | ICD-10-CM | POA: Insufficient documentation

## 2011-04-02 LAB — POCT I-STAT, CHEM 8
Calcium, Ion: 1.11 mmol/L — ABNORMAL LOW (ref 1.12–1.32)
Creatinine, Ser: 0.7 mg/dL (ref 0.50–1.10)
Glucose, Bld: 85 mg/dL (ref 70–99)
Hemoglobin: 13.9 g/dL (ref 12.0–15.0)
Potassium: 4.3 mEq/L (ref 3.5–5.1)
TCO2: 22 mmol/L (ref 0–100)

## 2011-09-11 DIAGNOSIS — O4690 Antepartum hemorrhage, unspecified, unspecified trimester: Secondary | ICD-10-CM

## 2011-09-11 HISTORY — DX: Antepartum hemorrhage, unspecified, unspecified trimester: O46.90

## 2011-09-11 HISTORY — PX: OTHER SURGICAL HISTORY: SHX169

## 2011-12-05 ENCOUNTER — Encounter (INDEPENDENT_AMBULATORY_CARE_PROVIDER_SITE_OTHER): Payer: 59

## 2011-12-05 ENCOUNTER — Encounter: Payer: 59 | Admitting: Obstetrics and Gynecology

## 2011-12-05 DIAGNOSIS — O200 Threatened abortion: Secondary | ICD-10-CM

## 2011-12-05 DIAGNOSIS — R300 Dysuria: Secondary | ICD-10-CM

## 2011-12-05 LAB — OB RESULTS CONSOLE ABO/RH

## 2011-12-05 LAB — OB RESULTS CONSOLE RUBELLA ANTIBODY, IGM: Rubella: IMMUNE

## 2011-12-05 LAB — OB RESULTS CONSOLE HIV ANTIBODY (ROUTINE TESTING): HIV: NONREACTIVE

## 2011-12-05 LAB — OB RESULTS CONSOLE HEPATITIS B SURFACE ANTIGEN: Hepatitis B Surface Ag: NEGATIVE

## 2011-12-06 ENCOUNTER — Encounter (INDEPENDENT_AMBULATORY_CARE_PROVIDER_SITE_OTHER): Payer: 59 | Admitting: Obstetrics and Gynecology

## 2011-12-06 ENCOUNTER — Encounter (INDEPENDENT_AMBULATORY_CARE_PROVIDER_SITE_OTHER): Payer: 59

## 2011-12-06 DIAGNOSIS — O26849 Uterine size-date discrepancy, unspecified trimester: Secondary | ICD-10-CM

## 2011-12-06 DIAGNOSIS — O200 Threatened abortion: Secondary | ICD-10-CM

## 2011-12-17 ENCOUNTER — Encounter (INDEPENDENT_AMBULATORY_CARE_PROVIDER_SITE_OTHER): Payer: 59 | Admitting: Obstetrics and Gynecology

## 2011-12-17 DIAGNOSIS — Z331 Pregnant state, incidental: Secondary | ICD-10-CM

## 2011-12-17 DIAGNOSIS — Z3689 Encounter for other specified antenatal screening: Secondary | ICD-10-CM

## 2011-12-17 DIAGNOSIS — Z01419 Encounter for gynecological examination (general) (routine) without abnormal findings: Secondary | ICD-10-CM

## 2012-01-22 ENCOUNTER — Encounter: Payer: Self-pay | Admitting: Obstetrics and Gynecology

## 2012-01-23 ENCOUNTER — Ambulatory Visit (INDEPENDENT_AMBULATORY_CARE_PROVIDER_SITE_OTHER): Payer: 59 | Admitting: Obstetrics and Gynecology

## 2012-01-23 ENCOUNTER — Ambulatory Visit: Payer: 59

## 2012-01-23 ENCOUNTER — Encounter: Payer: 59 | Admitting: Obstetrics and Gynecology

## 2012-01-23 ENCOUNTER — Encounter: Payer: Self-pay | Admitting: Obstetrics and Gynecology

## 2012-01-23 VITALS — BP 108/72 | Ht 64.0 in | Wt 252.0 lb

## 2012-01-23 DIAGNOSIS — J309 Allergic rhinitis, unspecified: Secondary | ICD-10-CM

## 2012-01-23 DIAGNOSIS — O26849 Uterine size-date discrepancy, unspecified trimester: Secondary | ICD-10-CM

## 2012-01-23 DIAGNOSIS — Z3689 Encounter for other specified antenatal screening: Secondary | ICD-10-CM

## 2012-01-23 DIAGNOSIS — Z3490 Encounter for supervision of normal pregnancy, unspecified, unspecified trimester: Secondary | ICD-10-CM

## 2012-01-23 DIAGNOSIS — Z331 Pregnant state, incidental: Secondary | ICD-10-CM

## 2012-01-23 NOTE — Progress Notes (Signed)
Size/date discrepancy with new EDC assigned based on 8 wk [place]. Wants quad screen today. C/o sinus discomfort, allergic.Recommend referral to Dr. Willa Rough for eval.

## 2012-01-23 NOTE — Progress Notes (Signed)
Pt states she has no concerns today.  Pt states she has not been eating as much and that it may be due to stress.

## 2012-01-23 NOTE — Patient Instructions (Signed)
Allergic Rhinitis  Allergic rhinitis is when the mucous membranes in the nose respond to allergens. Allergens are particles in the air that cause your body to have an allergic reaction. This causes you to release allergic antibodies. Through a chain of events, these eventually cause you to release histamine into the blood stream (hence the use of antihistamines). Although meant to be protective to the body, it is this release that causes your discomfort, such as frequent sneezing, congestion and an itchy runny nose.    CAUSES    The pollen allergens may come from grasses, trees, and weeds. This is seasonal allergic rhinitis, or "hay fever." Other allergens cause year-round allergic rhinitis (perennial allergic rhinitis) such as house dust mite allergen, pet dander and mold spores.    SYMPTOMS     Nasal stuffiness (congestion).   Runny, itchy nose with sneezing and tearing of the eyes.   There is often an itching of the mouth, eyes and ears.  It cannot be cured, but it can be controlled with medications.  DIAGNOSIS    If you are unable to determine the offending allergen, skin or blood testing may find it.  TREATMENT     Avoid the allergen.   Medications and allergy shots (immunotherapy) can help.   Hay fever may often be treated with antihistamines in pill or nasal spray forms. Antihistamines block the effects of histamine. There are over-the-counter medicines that may help with nasal congestion and swelling around the eyes. Check with your caregiver before taking or giving this medicine.  If the treatment above does not work, there are many new medications your caregiver can prescribe. Stronger medications may be used if initial measures are ineffective. Desensitizing injections can be used if medications and avoidance fails. Desensitization is when a patient is given ongoing shots until the body becomes less sensitive to the allergen. Make sure you follow up with your caregiver if problems continue.  SEEK  MEDICAL CARE IF:     You develop fever (more than 100.5 F (38.1 C).   You develop a cough that does not stop easily (persistent).   You have shortness of breath.   You start wheezing.   Symptoms interfere with normal daily activities.  Document Released: 05/22/2001 Document Revised: 08/16/2011 Document Reviewed: 12/01/2008  [employer] Patient Information 2012 [employer].

## 2012-01-24 ENCOUNTER — Telehealth: Payer: Self-pay

## 2012-01-24 LAB — AFP, QUAD SCREEN
AFP: 21.8 IU/mL
Age Alone: 1:624 {titer}
Down Syndrome Scr Risk Est: 1:18000 {titer}
HCG, Total: 25957 m[IU]/mL
MoM for INH: 0.6
Open Spina bifida: NEGATIVE
Trisomy 18 (Edward) Syndrome Interp.: 1:4190 {titer}

## 2012-01-24 NOTE — Telephone Encounter (Signed)
Pc to pt per referral appt sched 01/31/12@8 :30a with Dr. Willa Rough. Pt agrees.

## 2012-02-20 ENCOUNTER — Other Ambulatory Visit: Payer: 59

## 2012-02-20 ENCOUNTER — Encounter: Payer: 59 | Admitting: Obstetrics and Gynecology

## 2012-02-28 NOTE — Progress Notes (Signed)
This encounter was created in error - please disregard.

## 2012-03-14 ENCOUNTER — Inpatient Hospital Stay (HOSPITAL_COMMUNITY)
Admission: AD | Admit: 2012-03-14 | Discharge: 2012-03-14 | Disposition: A | Discharge status: Home / Self Care | Payer: Medicaid Other | Source: Ambulatory Visit | Attending: Obstetrics and Gynecology | Admitting: Obstetrics and Gynecology

## 2012-03-14 ENCOUNTER — Encounter (HOSPITAL_COMMUNITY): Payer: Self-pay | Admitting: *Deleted

## 2012-03-14 DIAGNOSIS — Z98891 History of uterine scar from previous surgery: Secondary | ICD-10-CM

## 2012-03-14 DIAGNOSIS — R109 Unspecified abdominal pain: Secondary | ICD-10-CM | POA: Insufficient documentation

## 2012-03-14 DIAGNOSIS — N760 Acute vaginitis: Secondary | ICD-10-CM

## 2012-03-14 DIAGNOSIS — Z331 Pregnant state, incidental: Secondary | ICD-10-CM

## 2012-03-14 DIAGNOSIS — J45909 Unspecified asthma, uncomplicated: Secondary | ICD-10-CM

## 2012-03-14 DIAGNOSIS — M549 Dorsalgia, unspecified: Secondary | ICD-10-CM | POA: Insufficient documentation

## 2012-03-14 DIAGNOSIS — O99891 Other specified diseases and conditions complicating pregnancy: Secondary | ICD-10-CM | POA: Insufficient documentation

## 2012-03-14 DIAGNOSIS — Z9889 Other specified postprocedural states: Secondary | ICD-10-CM

## 2012-03-14 HISTORY — DX: History of uterine scar from previous surgery: Z98.891

## 2012-03-14 HISTORY — DX: Other specified postprocedural states: Z98.890

## 2012-03-14 LAB — URINALYSIS, ROUTINE W REFLEX MICROSCOPIC
Ketones, ur: 15 mg/dL — AB
Nitrite: NEGATIVE
Protein, ur: NEGATIVE mg/dL
pH: 6.5 (ref 5.0–8.0)

## 2012-03-14 LAB — URINE MICROSCOPIC-ADD ON

## 2012-03-14 MED ORDER — IBUPROFEN 200 MG PO TABS: 600.0000 mg | ORAL_TABLET | Freq: Four times a day (QID) | ORAL | Status: AC | PRN

## 2012-03-14 MED ORDER — IBUPROFEN 600 MG PO TABS
600.0000 mg | ORAL_TABLET | Freq: Once | ORAL | Status: AC
  Administered 2012-03-14: 600 mg via ORAL
  Filled 2012-03-14: qty 1

## 2012-03-14 MED ORDER — METRONIDAZOLE 500 MG PO TABS: 500.0000 mg | ORAL_TABLET | Freq: Two times a day (BID) | ORAL | Status: AC

## 2012-03-14 NOTE — MAU Note (Signed)
Pt was at work and started c/o sharp cramping type pain in lower abdomen and lower back- started around 1000 this AM; G6P2;

## 2012-03-14 NOTE — MAU Provider Note (Signed)
History     CSN: [id number]  Arrival date and time: 03/17/2012 1353   None     Chief Complaint  Patient presents with  . Abdominal Pain  . Back Pain   HPI Comments: Pt is a [id number] at [redacted]w[redacted]d that arrives unannounced w c/o abd cramping and back pain that started this AM at work, she admits to drinking very little water today and has not eaten today, 2 young children are w pt at [facility].  She denies any VB, LOF or d/c. She's not had an office visit since 15wks.  She had a dating [place] at 8wks.  Pregnancy significant for: 1. Hx c/s for breech in 02/09/2011 2. Hx infant death at 28 mos SIDS 02/09/2011 3. Hx asthma 4. Hx smoking 5. Hx IAB x2 6. EDC date changed based on 8wk [place]    Abdominal Pain  Back Pain Associated symptoms include abdominal pain.     Past Medical History  Diagnosis Date  . History of chlamydia 12/27/2000  . H/O rubella   . H/O varicella   . H/O cystitis 2001/02/08  . H/O pyelonephritis 2001-02-08  . Asthma   . Abnormal Pap smear 2010-02-08  . Vaginal bleeding in pregnancy 02/09/12  . Pain pelvic 2001-02-08  . Dysplasia of cervix, low grade (CIN 1)   . Monilial vaginitis 08/2006  . History of PID 03/29/2006    Past Surgical History  Procedure Date  . Cesarean section   . Bilateral tubal ligation 08/31/2010  . Headaches 02-09-2012     during pregnancy    Family History  Problem Relation Age of Onset  . Cancer Maternal Grandmother   . Diabetes Paternal Grandmother   . Other Neg Hx     History  Substance Use Topics  . Smoking status: Never Smoker   . Smokeless tobacco: Never Used  . Alcohol Use: No    Allergies: No Known Allergies  No prescriptions prior to admission    Review of Systems  Gastrointestinal: Positive for abdominal pain.  Musculoskeletal: Positive for back pain.  All other systems reviewed and are negative.   Physical Exam   Blood pressure 113/70, pulse 108, temperature 98.8 F (37.1 C), temperature source Oral, resp. rate 16, height 5\' 4"  (1.626 m), weight 257 lb  (116.574 kg), last menstrual period 09/04/2011, SpO2 100.00%.  Physical Exam  Nursing note and vitals reviewed. Constitutional: She is oriented to person, place, and time. She appears well-developed and well-nourished. No distress.       obese  HENT:  Head: Normocephalic.  Neck: Normal range of motion.  Cardiovascular: Normal rate, regular rhythm and normal heart sounds.   Respiratory: Effort normal.  GI: Soft. Bowel sounds are normal.  Genitourinary: Vaginal discharge found.       Mod amt frothy white d/c GC/CT and wet prep sent VE=cl/th/high   Musculoskeletal: Normal range of motion. She exhibits no edema.  Neurological: She is alert and oriented to person, place, and time.  Skin: Skin is warm and dry.  Psychiatric: She has a normal mood and affect. Her behavior is normal.   Results for orders placed during the hospital encounter of March 17, 2012 (from the past 24 hour(s))  URINALYSIS, ROUTINE W REFLEX MICROSCOPIC     Status: Abnormal   Collection Time   2012/03/17  2:15 PM      Component Value Range   Color, Urine YELLOW  YELLOW   APPearance HAZY (*) CLEAR   Specific Gravity, Urine 1.010  1.005 - 1.030  pH 6.5  5.0 - 8.0   Glucose, UA NEGATIVE  NEGATIVE mg/dL   Hgb urine dipstick NEGATIVE  NEGATIVE   Bilirubin Urine NEGATIVE  NEGATIVE   Ketones, ur 15 (*) NEGATIVE mg/dL   Protein, ur NEGATIVE  NEGATIVE mg/dL   Urobilinogen, UA 0.2  0.0 - 1.0 mg/dL   Nitrite NEGATIVE  NEGATIVE   Leukocytes, UA SMALL (*) NEGATIVE  URINE MICROSCOPIC-ADD ON     Status: Abnormal   Collection Time   04/02/2012  2:15 PM      Component Value Range   Squamous Epithelial / LPF MANY (*) RARE   WBC, UA 3-6  <3 WBC/hpf   RBC / HPF 0-2  <3 RBC/hpf   Bacteria, UA MANY (*) RARE   Urine-Other MUCOUS PRESENT    WET PREP, GENITAL     Status: Abnormal   Collection Time   03/16/2012  3:50 PM      Component Value Range   Yeast Wet Prep HPF POC NONE SEEN  NONE SEEN   Trich, Wet Prep NONE SEEN  NONE SEEN   Clue Cells  Wet Prep HPF POC MODERATE (*) NONE SEEN   WBC, Wet Prep HPF POC FEW (*) NONE SEEN    MAU Course  Procedures    Assessment and Plan  IUP at [redacted]w[redacted]d FHR tracing reassuring for GA 150's toco quiet Wet prep +clue Pain improved after motrin and PO hydration Will send UA for cultrue  GC/CT pending   D/C home rx for flagyl and motrin Call to sched OV and anatomy US ASAP  Lynnley Doddridge M 04/04/2012, 4:59 PM

## 2012-03-14 NOTE — MAU Note (Signed)
Patient states she has been having lower to mid abdominal pain and low back pain today. States it is constant. Denies bleeding or leaking and has been feeling movement but not as much.

## 2012-03-25 ENCOUNTER — Ambulatory Visit (INDEPENDENT_AMBULATORY_CARE_PROVIDER_SITE_OTHER): Payer: Medicaid Other

## 2012-03-25 ENCOUNTER — Ambulatory Visit (INDEPENDENT_AMBULATORY_CARE_PROVIDER_SITE_OTHER): Payer: Medicaid Other | Admitting: Obstetrics and Gynecology

## 2012-03-25 ENCOUNTER — Encounter: Payer: Self-pay | Admitting: Obstetrics and Gynecology

## 2012-03-25 VITALS — BP 90/54 | Wt 256.0 lb

## 2012-03-25 DIAGNOSIS — O26849 Uterine size-date discrepancy, unspecified trimester: Secondary | ICD-10-CM

## 2012-03-25 NOTE — Progress Notes (Signed)
Pt without c/o EFW:  660grams   1-7#,   26.7% ZOX:[id number] Positionbreech Placenta location: anterior fundal, grade  glucola @NV  with scp and HIV ROI op note then pt needs VBAC consent

## 2012-03-25 NOTE — Progress Notes (Signed)
Pt states no concerns today.   

## 2012-04-10 DIAGNOSIS — 419620001 Death: Secondary | SNOMED CT

## 2012-04-10 HISTORY — DX: Death: 419620001

## 2012-04-10 DEATH — deceased

## 2012-04-23 ENCOUNTER — Other Ambulatory Visit: Payer: Medicaid Other

## 2012-04-23 ENCOUNTER — Encounter: Payer: Medicaid Other | Admitting: Obstetrics and Gynecology

## 2012-04-24 ENCOUNTER — Ambulatory Visit (INDEPENDENT_AMBULATORY_CARE_PROVIDER_SITE_OTHER): Payer: Medicaid Other | Admitting: Obstetrics and Gynecology

## 2012-04-24 ENCOUNTER — Other Ambulatory Visit: Payer: Medicaid Other

## 2012-04-24 ENCOUNTER — Encounter: Payer: Medicaid Other | Admitting: Obstetrics and Gynecology

## 2012-04-24 VITALS — BP 100/60 | Wt 252.0 lb

## 2012-04-24 DIAGNOSIS — Z331 Pregnant state, incidental: Secondary | ICD-10-CM

## 2012-04-24 LAB — HEMOGLOBIN: Hemoglobin: 10.5 g/dL — ABNORMAL LOW (ref 12.0–15.0)

## 2012-04-24 NOTE — Progress Notes (Signed)
[redacted]w[redacted]d Doing well. Glucola, RPR, hemoglobin today. Return office in 2 weeks. Breast-feeding, circumcision, contraception, car seat, preterm labor, pregnancy issues discussed. Dr. Stefano Gaul

## 2012-04-24 NOTE — Progress Notes (Signed)
1 GTT today  Blood draw at 11:05am.

## 2012-04-25 LAB — RPR

## 2012-04-25 LAB — GLUCOSE TOLERANCE, 1 HOUR (50G) W/O FASTING: Glucose, 1 Hour GTT: 108 mg/dL (ref 70–140)

## 2012-05-05 IMAGING — CR DG CHEST 2V
2 series · 2 of 2 positions shown · non-contrast
Comparison: None.

CLINICAL DATA: Chest pain-

CHEST - 2 VIEW

[w chest pa]
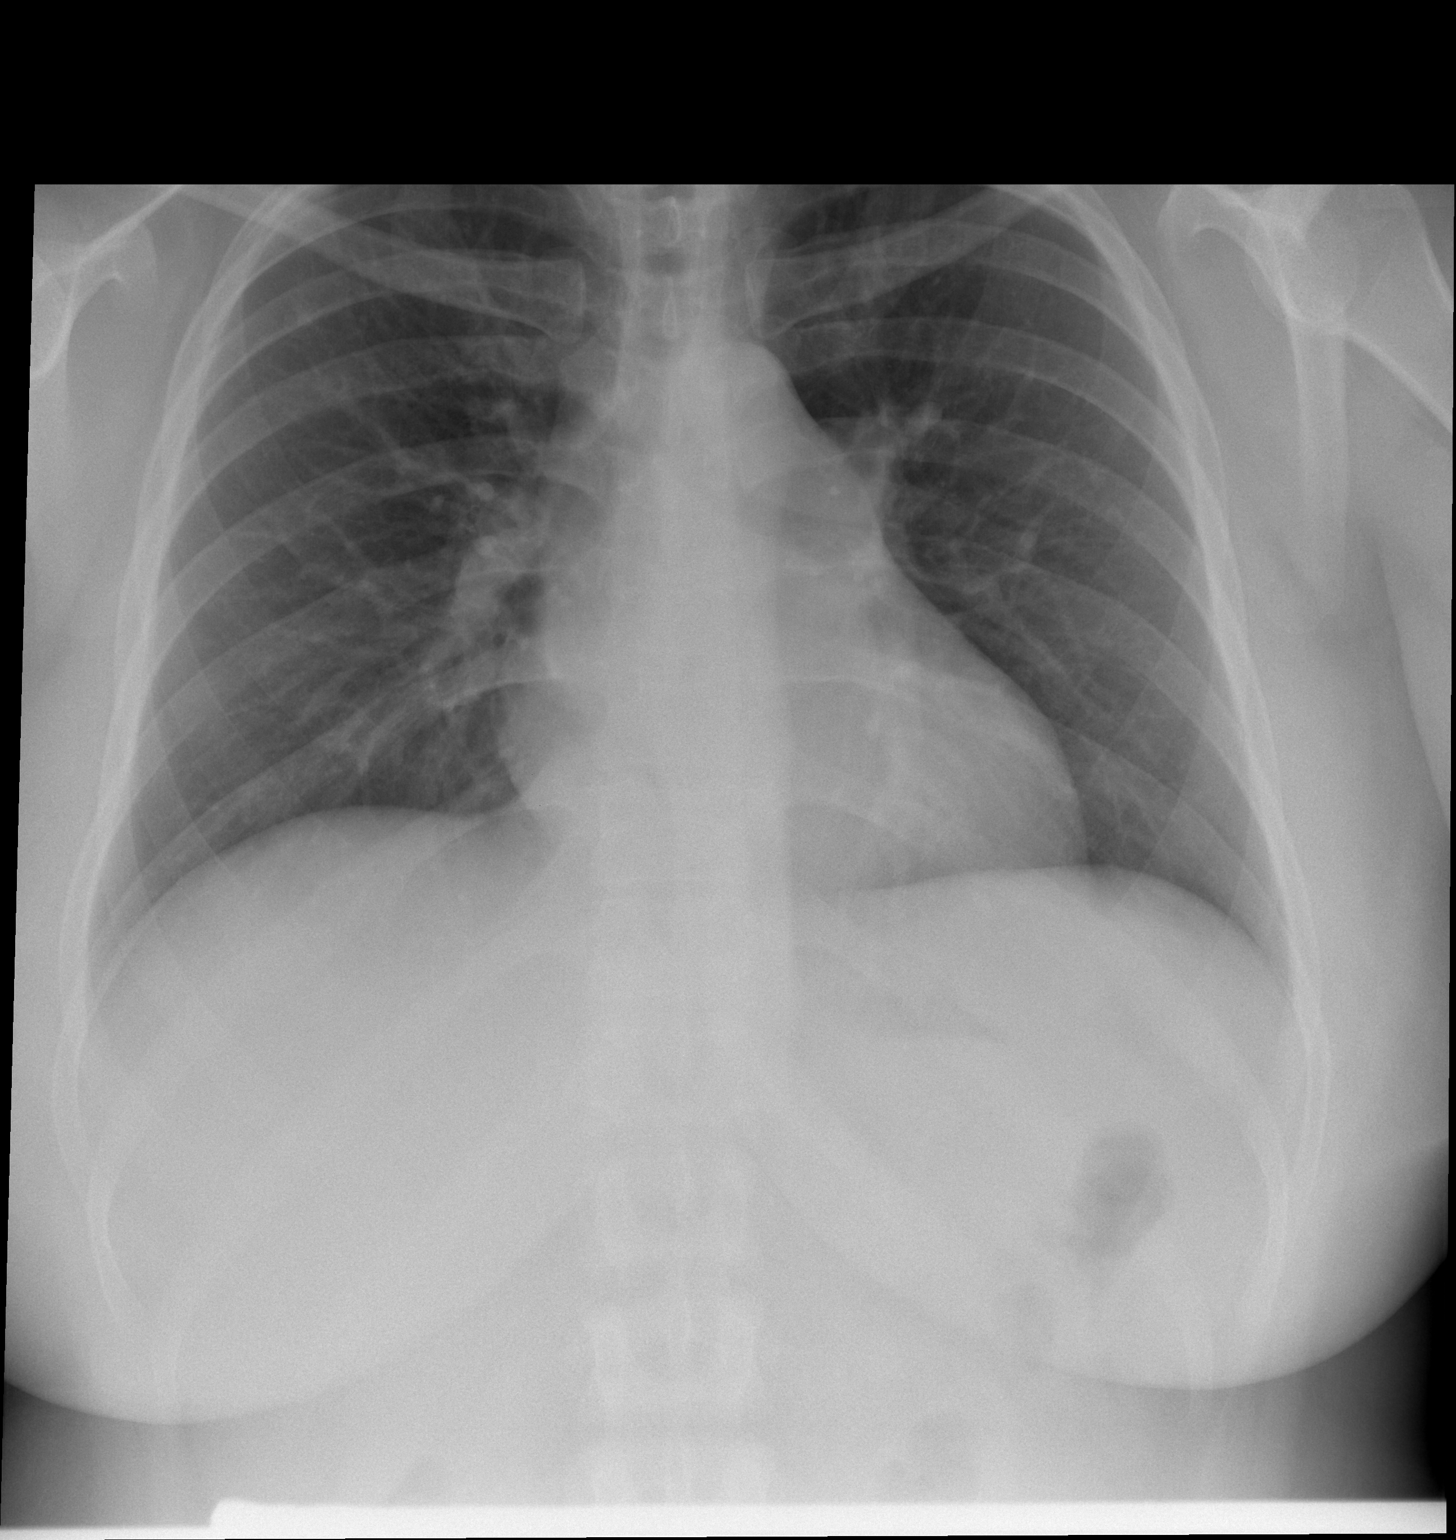

[w chest lat]
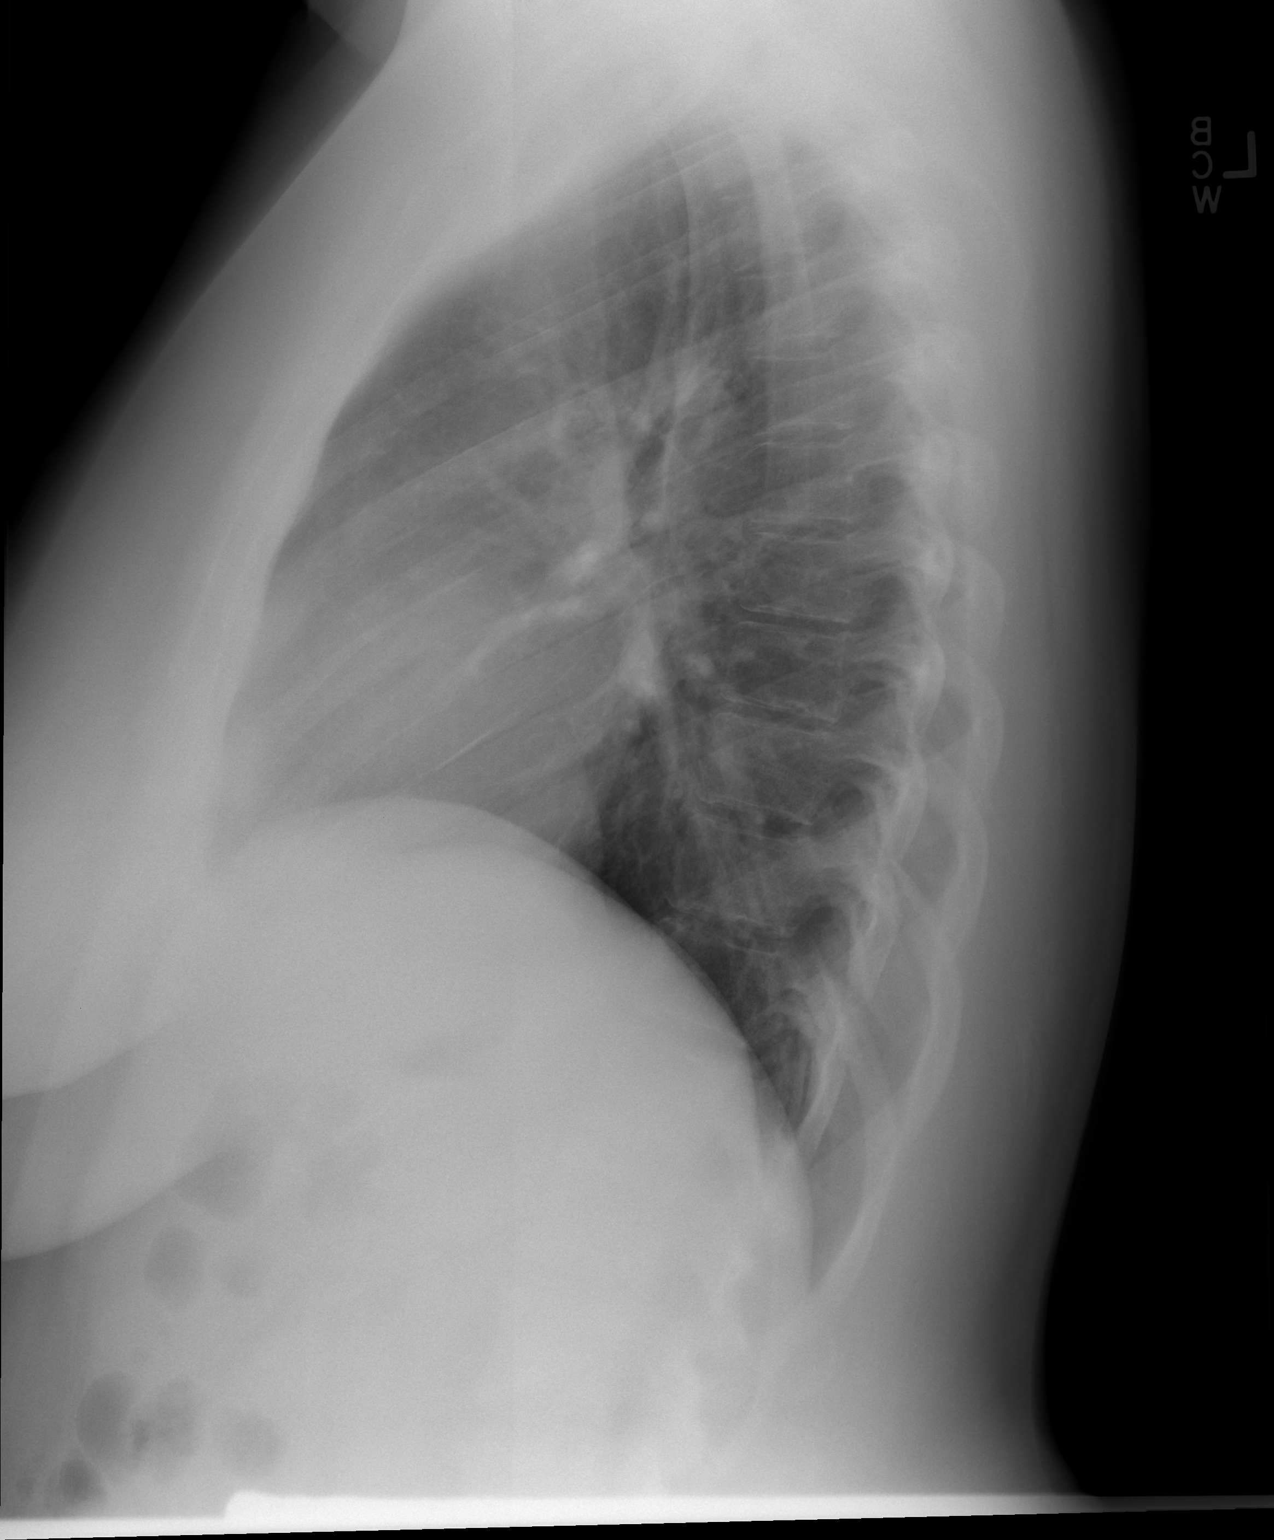

[2 of 2 positions shown; findings below may reference images not displayed]

FINDINGS: The heart size and mediastinal contours are within
normal limits.  Both lungs are clear.  The visualized skeletal
structures are unremarkable.

Artifact noted over the right upper chest from a "gown snap or
telemetry lead".
IMPRESSION: No active cardiopulmonary disease.

## 2012-05-08 ENCOUNTER — Ambulatory Visit (INDEPENDENT_AMBULATORY_CARE_PROVIDER_SITE_OTHER): Payer: Medicaid Other | Admitting: Obstetrics and Gynecology

## 2012-05-08 VITALS — BP 102/60 | Wt 260.0 lb

## 2012-05-08 DIAGNOSIS — Z3490 Encounter for supervision of normal pregnancy, unspecified, unspecified trimester: Secondary | ICD-10-CM

## 2012-05-08 DIAGNOSIS — Z331 Pregnant state, incidental: Secondary | ICD-10-CM

## 2012-05-08 NOTE — Progress Notes (Signed)
Doing well. Reviewed labs. Fetus likely breech still, and S>D. [place] NV for growth and fluid--follow position in 3rd trimester, hx previous breech

## 2012-05-08 NOTE — Progress Notes (Signed)
Pt states she would like to know if her baby is still breech.

## 2012-05-28 ENCOUNTER — Encounter: Payer: Self-pay | Admitting: Obstetrics and Gynecology

## 2012-05-28 ENCOUNTER — Ambulatory Visit (INDEPENDENT_AMBULATORY_CARE_PROVIDER_SITE_OTHER): Payer: Medicaid Other | Admitting: Obstetrics and Gynecology

## 2012-05-28 ENCOUNTER — Other Ambulatory Visit: Payer: Self-pay

## 2012-05-28 ENCOUNTER — Other Ambulatory Visit (INDEPENDENT_AMBULATORY_CARE_PROVIDER_SITE_OTHER): Payer: Medicaid Other

## 2012-05-28 VITALS — BP 100/60 | Wt 262.0 lb

## 2012-05-28 DIAGNOSIS — O26849 Uterine size-date discrepancy, unspecified trimester: Secondary | ICD-10-CM

## 2012-05-28 DIAGNOSIS — Z3490 Encounter for supervision of normal pregnancy, unspecified, unspecified trimester: Secondary | ICD-10-CM

## 2012-05-28 DIAGNOSIS — Z331 Pregnant state, incidental: Secondary | ICD-10-CM

## 2012-05-28 LAB — US OB FOLLOW UP

## 2012-05-28 NOTE — Progress Notes (Signed)
Doing well. Currently undecided regarding VBAC or repeat C/S--declines BTL. Issues and questions reviewed re: this issue. Will likely decide by NV [place] today: Vtx, 33 3/7 weeks, [facility] 11/32/13 c/w dating. EFW 4+13, 56%ile.   AFI 14.6, 55%ile.  Cx 3.11

## 2012-05-28 NOTE — Progress Notes (Signed)
No concerns per pt 

## 2012-06-11 ENCOUNTER — Encounter: Payer: Self-pay | Admitting: Obstetrics and Gynecology

## 2012-06-11 ENCOUNTER — Ambulatory Visit (INDEPENDENT_AMBULATORY_CARE_PROVIDER_SITE_OTHER): Payer: Medicaid Other | Admitting: Obstetrics and Gynecology

## 2012-06-11 VITALS — BP 92/64 | Wt 259.0 lb

## 2012-06-11 DIAGNOSIS — Z331 Pregnant state, incidental: Secondary | ICD-10-CM

## 2012-06-11 NOTE — Progress Notes (Signed)
[redacted]w[redacted]d Doing well. Wants cesarean section.  We'll schedule. Beta strep next visit. Return to office 1 week. Dr. Stefano Gaul

## 2012-06-11 NOTE — Progress Notes (Signed)
Pt has questions about flu shot.

## 2012-06-17 ENCOUNTER — Telehealth: Payer: Self-pay | Admitting: Obstetrics and Gynecology

## 2012-06-17 ENCOUNTER — Other Ambulatory Visit: Payer: Self-pay | Admitting: Obstetrics and Gynecology

## 2012-06-17 NOTE — Telephone Encounter (Signed)
Repeat C/S scheduled for 07/06/2012 @ 11:30 with AVS. -Adrianne Pridgen

## 2012-06-18 ENCOUNTER — Ambulatory Visit (INDEPENDENT_AMBULATORY_CARE_PROVIDER_SITE_OTHER): Payer: Medicaid Other | Admitting: Obstetrics and Gynecology

## 2012-06-18 ENCOUNTER — Encounter: Payer: Medicaid Other | Admitting: Obstetrics and Gynecology

## 2012-06-18 VITALS — BP 100/62 | Wt 261.0 lb

## 2012-06-18 DIAGNOSIS — Z23 Encounter for immunization: Secondary | ICD-10-CM

## 2012-06-18 DIAGNOSIS — Z331 Pregnant state, incidental: Secondary | ICD-10-CM

## 2012-06-18 NOTE — Progress Notes (Signed)
[redacted]w[redacted]d GBS today   

## 2012-06-18 NOTE — Progress Notes (Signed)
[redacted]w[redacted]d Beta strep today. Patient was to try VBAC if she labors prior to C-section date. Return office in 1 week. Dr. Stefano Gaul

## 2012-06-18 NOTE — Addendum Note (Signed)
Addended by: Tim Lair on: 06/18/2012 12:12 PM   Modules accepted: Orders

## 2012-06-23 ENCOUNTER — Telehealth: Payer: Self-pay | Admitting: Obstetrics and Gynecology

## 2012-06-23 ENCOUNTER — Encounter (HOSPITAL_COMMUNITY): Payer: Self-pay | Admitting: Pharmacist

## 2012-06-23 NOTE — Telephone Encounter (Signed)
LMTC @ 10:35  ld

## 2012-06-23 NOTE — Telephone Encounter (Signed)
Pt notified of +GBS.  Explained in detail what GBS is and that the pt does have a disease.  Pt agreeable.  ld

## 2012-06-25 ENCOUNTER — Ambulatory Visit (INDEPENDENT_AMBULATORY_CARE_PROVIDER_SITE_OTHER): Payer: Medicaid Other | Admitting: Obstetrics and Gynecology

## 2012-06-25 ENCOUNTER — Encounter: Payer: Medicaid Other | Admitting: Obstetrics and Gynecology

## 2012-06-25 VITALS — BP 104/64 | Wt 266.0 lb

## 2012-06-25 DIAGNOSIS — Z331 Pregnant state, incidental: Secondary | ICD-10-CM

## 2012-06-25 NOTE — Progress Notes (Signed)
[redacted]w[redacted]d Beta strep positive. The patient is noticing more contractions. Return office in one week. Comfort measures to try to help improve sleeping. Dr. Stefano Gaul

## 2012-06-25 NOTE — Progress Notes (Deleted)
[redacted]w[redacted]d  GBS :Positive Pt c/o: not getting any sleep pt asked if she can have a Rx to help her sleep.

## 2012-07-01 ENCOUNTER — Encounter (HOSPITAL_COMMUNITY): Payer: Self-pay

## 2012-07-02 ENCOUNTER — Encounter (HOSPITAL_COMMUNITY)
Admission: RE | Admit: 2012-07-02 | Discharge: 2012-07-02 | Disposition: A | Discharge status: Home / Self Care | Payer: Medicaid Other | Source: Ambulatory Visit | Attending: Obstetrics and Gynecology | Admitting: Obstetrics and Gynecology

## 2012-07-02 ENCOUNTER — Encounter: Payer: Medicaid Other | Admitting: Obstetrics and Gynecology

## 2012-07-02 ENCOUNTER — Encounter (HOSPITAL_COMMUNITY): Payer: Self-pay

## 2012-07-02 LAB — CBC
HCT: 34.5 % — ABNORMAL LOW (ref 36.0–46.0)
Hemoglobin: 11.1 g/dL — ABNORMAL LOW (ref 12.0–15.0)
MCHC: 32.2 g/dL (ref 30.0–36.0)
MCV: 86.3 fL (ref 78.0–100.0)

## 2012-07-02 LAB — SURGICAL PCR SCREEN: MRSA, PCR: NEGATIVE

## 2012-07-02 NOTE — Pre-Procedure Instructions (Signed)
Dr Debria Garret office (Adrianne) notified of patient's refusal of blood.

## 2012-07-02 NOTE — Patient Instructions (Addendum)
20 [name]  07/02/2012   Your procedure is scheduled on:  06/29/2012  Enter through the Main Entrance of [facility] at 1000 AM.  Pick up the phone at the desk and dial [phone].   Call this number if you have problems the morning of surgery: [phone]   Remember:   Do not eat food:After Midnight.  Do not drink clear liquids: After Midnight.  Take these medicines the morning of surgery with A SIP OF WATER: NA   Do not wear jewelry, make-up or nail polish.  Do not wear lotions, powders, or perfumes. You may wear deodorant.  Do not shave 48 hours prior to surgery.  Do not bring valuables to the hospital.  Contacts, dentures or bridgework may not be worn into surgery.  Leave suitcase in the car. After surgery it may be brought to your room.  For patients admitted to the hospital, checkout time is 11:00 AM the day of discharge.   Patients discharged the day of surgery will not be allowed to drive home.  Name and phone number of your driver: NA  Special Instructions: Shower using CHG 2 nights before surgery and the night before surgery.  If you shower the day of surgery use CHG.  Use special wash - you have one bottle of CHG for all showers.  You should use approximately 1/3 of the bottle for each shower.   Please read over the following fact sheets that you were given: MRSA Information

## 2012-07-03 ENCOUNTER — Ambulatory Visit (INDEPENDENT_AMBULATORY_CARE_PROVIDER_SITE_OTHER): Payer: Medicaid Other | Admitting: Obstetrics and Gynecology

## 2012-07-03 ENCOUNTER — Encounter: Payer: Self-pay | Admitting: Obstetrics and Gynecology

## 2012-07-03 VITALS — BP 110/70 | Wt 267.0 lb

## 2012-07-03 DIAGNOSIS — Z331 Pregnant state, incidental: Secondary | ICD-10-CM

## 2012-07-03 NOTE — Progress Notes (Signed)
[redacted]w[redacted]d Complains of more contractions. Return to office in 1 week prior to cesarean section. Dr. Stefano Gaul

## 2012-07-03 NOTE — Progress Notes (Signed)
Pt c/o intense pain today, scheduled for c-section Wednesday but would like her cervix checked today.

## 2012-07-08 ENCOUNTER — Ambulatory Visit (INDEPENDENT_AMBULATORY_CARE_PROVIDER_SITE_OTHER): Payer: Medicaid Other | Admitting: Obstetrics and Gynecology

## 2012-07-08 VITALS — BP 112/62 | Wt 267.0 lb

## 2012-07-08 DIAGNOSIS — Z98891 History of uterine scar from previous surgery: Secondary | ICD-10-CM

## 2012-07-08 DIAGNOSIS — Z9889 Other specified postprocedural states: Secondary | ICD-10-CM

## 2012-07-08 MED ORDER — DEXTROSE 5 % IV SOLN
3.0000 g | Freq: Once | INTRAVENOUS | Status: AC
  Administered 2012-07-09: 3 g via INTRAVENOUS
  Filled 2012-07-08: qty 3000

## 2012-07-08 NOTE — H&P (Signed)
Admission History and Physical Exam for an Obstetrics Patient  [name] is a 31 y.o. female, [id number], at [redacted]w[redacted]d gestation, who presents for repeat Cesarean Section. She has been followed at the [facility] and Gynecology division of [facility].  Her pregnancy has been complicated by obesity, history of a prior CS, history of a SIDS death at three months of age, asthma and a positive Beta Strep culture in the third trimester. See history below.  OB History    Grav Para Term Preterm Abortions TAB SAB Ect Mult Living   7 3 3  3  3   2       Past Medical History  Diagnosis Date  . History of chlamydia 12/27/2000  . H/O rubella   . H/O varicella   . H/O cystitis 2002  . H/O pyelonephritis 2002  . Abnormal Pap smear 2011  . Vaginal bleeding in pregnancy 2013  . Pain pelvic 2002  . Dysplasia of cervix, low grade (CIN 1)   . Monilial vaginitis 08/2006  . History of PID 03/29/2006  . Asthma     "out grew it" no inhaler    No prescriptions prior to admission    Past Surgical History  Procedure Date  . Cesarean section   . Headaches 2013     during pregnancy  . Bilateral tubal ligation     No Known Allergies  Family History: family history includes Cancer in her maternal grandmother and Diabetes in her paternal grandmother.  There is no history of Other.  Social History:  reports that she has been smoking.  She has never used smokeless tobacco. She reports that she does not drink alcohol or use illicit drugs.  Review of systems: Normal pregnancy complaints.  Admission Physical Exam:    Body mass index is 44.11 kg/(m^2).  Height 5\' 4"  (1.626 m), weight 257 lb (116.574 kg), last menstrual period 09/04/2011.  HEENT:                 Within normal limits Chest:                   Clear Heart:                    Regular rate and rhythm Abdomen:             Gravid and nontender Extremities:          Grossly normal Neurologic exam: Grossly  normal Pelvic exam:         Cervix: 2.5 cm  Prenatal labs: ABO, Rh:             AB/Positive/-- (03/27 1208) Antibody:              Negative (03/27 1208) Rubella:                 Immune RPR:                    NON REACTIVE (10/23 1151)  HBsAg:                 Negative (03/27 1208)  HIV:                       Non-reactive (03/27 1208)  GBS:                     POSITIVE (10/09 1125)   Assessment:  [redacted]w[redacted]d gestation  Prior CS  Desires repeat CS  Obesity  History of a SIDS death at three months  Positive Beta Strep  Asthma  Anemia  Plan:  Repeat CS   Suad Autrey V 07/08/2012, 9:00 PM

## 2012-07-08 NOTE — Progress Notes (Signed)
[redacted]w[redacted]d VBAC versus cesarean section discussed. Patient will call with her decision. Dr. Stefano Gaul

## 2012-07-08 NOTE — Progress Notes (Signed)
[redacted]w[redacted]d

## 2012-07-08 NOTE — Progress Notes (Signed)
Pt desires cervix check. Pt has scheduled C-Section, but pt states she is considering Vaginal delivery.

## 2012-07-09 ENCOUNTER — Inpatient Hospital Stay (HOSPITAL_COMMUNITY): Payer: Medicaid Other

## 2012-07-09 ENCOUNTER — Encounter (HOSPITAL_COMMUNITY)
Admission: AD | Disposition: A | Discharge status: Home / Self Care | Payer: Self-pay | Source: Ambulatory Visit | Attending: Obstetrics and Gynecology

## 2012-07-09 ENCOUNTER — Encounter (HOSPITAL_COMMUNITY): Payer: Self-pay | Admitting: *Deleted

## 2012-07-09 ENCOUNTER — Inpatient Hospital Stay (HOSPITAL_COMMUNITY)
Admission: AD | Admit: 2012-07-09 | Discharge: 2012-07-12 | DRG: 766 | Disposition: A | Discharge status: Home / Self Care | Payer: Medicaid Other | Source: Ambulatory Visit | Attending: Obstetrics and Gynecology | Admitting: Obstetrics and Gynecology

## 2012-07-09 ENCOUNTER — Encounter (HOSPITAL_COMMUNITY): Payer: Self-pay

## 2012-07-09 DIAGNOSIS — O34219 Maternal care for unspecified type scar from previous cesarean delivery: Principal | ICD-10-CM | POA: Diagnosis present

## 2012-07-09 DIAGNOSIS — Z22330 Carrier of Group B streptococcus: Secondary | ICD-10-CM

## 2012-07-09 DIAGNOSIS — O99214 Obesity complicating childbirth: Secondary | ICD-10-CM | POA: Diagnosis present

## 2012-07-09 DIAGNOSIS — Z98891 History of uterine scar from previous surgery: Secondary | ICD-10-CM

## 2012-07-09 DIAGNOSIS — E669 Obesity, unspecified: Secondary | ICD-10-CM | POA: Diagnosis present

## 2012-07-09 DIAGNOSIS — D649 Anemia, unspecified: Secondary | ICD-10-CM | POA: Diagnosis not present

## 2012-07-09 DIAGNOSIS — Z9889 Other specified postprocedural states: Secondary | ICD-10-CM

## 2012-07-09 DIAGNOSIS — O99892 Other specified diseases and conditions complicating childbirth: Secondary | ICD-10-CM | POA: Diagnosis present

## 2012-07-09 DIAGNOSIS — O9903 Anemia complicating the puerperium: Secondary | ICD-10-CM | POA: Diagnosis not present

## 2012-07-09 DIAGNOSIS — J45909 Unspecified asthma, uncomplicated: Secondary | ICD-10-CM

## 2012-07-09 HISTORY — PX: CYSTOSCOPY: SHX5120

## 2012-07-09 LAB — PREPARE RBC (CROSSMATCH)

## 2012-07-09 LAB — ABO/RH: ABO/RH(D): AB POS

## 2012-07-09 SURGERY — Surgical Case
Anesthesia: Spinal | Wound class: Clean Contaminated

## 2012-07-09 MED ORDER — LACTATED RINGERS IV SOLN: INTRAVENOUS | Status: DC

## 2012-07-09 MED ORDER — ONDANSETRON HCL 4 MG/2ML IJ SOLN: 4.0000 mg | Freq: Three times a day (TID) | INTRAMUSCULAR | Status: DC | PRN

## 2012-07-09 MED ORDER — TETANUS-DIPHTH-ACELL PERTUSSIS 5-2.5-18.5 LF-MCG/0.5 IM SUSP
0.5000 mL | Freq: Once | INTRAMUSCULAR | Status: AC
  Administered 2012-07-10: 0.5 mL via INTRAMUSCULAR
  Filled 2012-07-09: qty 0.5

## 2012-07-09 MED ORDER — MORPHINE SULFATE 0.5 MG/ML IJ SOLN
INTRAMUSCULAR | Status: AC
  Filled 2012-07-09: qty 10

## 2012-07-09 MED ORDER — NALOXONE HCL 0.4 MG/ML IJ SOLN: 0.4000 mg | INTRAMUSCULAR | Status: DC | PRN

## 2012-07-09 MED ORDER — INDIGOTINDISULFONATE SODIUM 8 MG/ML IJ SOLN
INTRAMUSCULAR | Status: AC
  Filled 2012-07-09: qty 5

## 2012-07-09 MED ORDER — LANOLIN HYDROUS EX OINT: 1.0000 "application " | TOPICAL_OINTMENT | CUTANEOUS | Status: DC | PRN

## 2012-07-09 MED ORDER — MEASLES, MUMPS & RUBELLA VAC ~~LOC~~ INJ: 0.5000 mL | INJECTION | Freq: Once | SUBCUTANEOUS | Status: DC

## 2012-07-09 MED ORDER — PRENATAL MULTIVITAMIN CH: 1.0000 | ORAL_TABLET | Freq: Every day | ORAL | Status: DC

## 2012-07-09 MED ORDER — DIPHENHYDRAMINE HCL 25 MG PO CAPS
25.0000 mg | ORAL_CAPSULE | Freq: Four times a day (QID) | ORAL | Status: DC | PRN
  Administered 2012-07-10: 25 mg via ORAL
  Filled 2012-07-09: qty 1

## 2012-07-09 MED ORDER — CHLOROPROCAINE HCL 3 % IJ SOLN
INTRAMUSCULAR | Status: AC
  Filled 2012-07-09: qty 20

## 2012-07-09 MED ORDER — PHENYLEPHRINE 40 MCG/ML (10ML) SYRINGE FOR IV PUSH (FOR BLOOD PRESSURE SUPPORT)
PREFILLED_SYRINGE | INTRAVENOUS | Status: AC
  Filled 2012-07-09: qty 5

## 2012-07-09 MED ORDER — MIDAZOLAM HCL 2 MG/2ML IJ SOLN: 0.5000 mg | Freq: Once | INTRAMUSCULAR | Status: DC | PRN

## 2012-07-09 MED ORDER — SENNOSIDES-DOCUSATE SODIUM 8.6-50 MG PO TABS
2.0000 | ORAL_TABLET | Freq: Every day | ORAL | Status: DC
Start: 2012-07-09 — End: 2012-07-12
  Administered 2012-07-09 – 2012-07-11 (×3): 2 via ORAL

## 2012-07-09 MED ORDER — FENTANYL CITRATE 0.05 MG/ML IJ SOLN: 25.0000 ug | INTRAMUSCULAR | Status: DC | PRN

## 2012-07-09 MED ORDER — OXYTOCIN 40 UNITS IN LACTATED RINGERS INFUSION - SIMPLE MED: 62.5000 mL/h | INTRAVENOUS | Status: AC

## 2012-07-09 MED ORDER — OXYCODONE-ACETAMINOPHEN 5-325 MG PO TABS
1.0000 | ORAL_TABLET | ORAL | Status: DC | PRN
  Administered 2012-07-10 – 2012-07-12 (×11): 2 via ORAL
  Filled 2012-07-09 (×5): qty 2
  Filled 2012-07-09: qty 1
  Filled 2012-07-09 (×5): qty 2

## 2012-07-09 MED ORDER — MIDAZOLAM HCL 2 MG/2ML IJ SOLN
INTRAMUSCULAR | Status: AC
  Filled 2012-07-09: qty 2

## 2012-07-09 MED ORDER — SIMETHICONE 80 MG PO CHEW
80.0000 mg | CHEWABLE_TABLET | ORAL | Status: DC | PRN
  Administered 2012-07-11: 80 mg via ORAL

## 2012-07-09 MED ORDER — DIBUCAINE 1 % RE OINT: 1.0000 "application " | TOPICAL_OINTMENT | RECTAL | Status: DC | PRN

## 2012-07-09 MED ORDER — EPHEDRINE 5 MG/ML INJ
INTRAVENOUS | Status: AC
  Filled 2012-07-09: qty 10

## 2012-07-09 MED ORDER — SIMETHICONE 80 MG PO CHEW
80.0000 mg | CHEWABLE_TABLET | Freq: Three times a day (TID) | ORAL | Status: DC
  Administered 2012-07-09 – 2012-07-12 (×5): 80 mg via ORAL

## 2012-07-09 MED ORDER — DIPHENHYDRAMINE HCL 50 MG/ML IJ SOLN
12.5000 mg | INTRAMUSCULAR | Status: DC | PRN
  Administered 2012-07-09: 12.5 mg via INTRAVENOUS

## 2012-07-09 MED ORDER — FENTANYL CITRATE 0.05 MG/ML IJ SOLN
INTRAMUSCULAR | Status: DC | PRN
  Administered 2012-07-09: 75 ug via INTRAVENOUS
  Administered 2012-07-09: 50 ug via INTRAVENOUS
  Administered 2012-07-09: 25 ug via INTRATHECAL
  Administered 2012-07-09: 50 ug via INTRAVENOUS

## 2012-07-09 MED ORDER — SODIUM CHLORIDE 0.9 % IJ SOLN: 3.0000 mL | INTRAMUSCULAR | Status: DC | PRN

## 2012-07-09 MED ORDER — NALBUPHINE HCL 10 MG/ML IJ SOLN
5.0000 mg | INTRAMUSCULAR | Status: DC | PRN
  Filled 2012-07-09: qty 1

## 2012-07-09 MED ORDER — ONDANSETRON HCL 4 MG PO TABS: 4.0000 mg | ORAL_TABLET | ORAL | Status: DC | PRN

## 2012-07-09 MED ORDER — ONDANSETRON HCL 4 MG/2ML IJ SOLN
INTRAMUSCULAR | Status: DC | PRN
  Administered 2012-07-09: 4 mg via INTRAVENOUS

## 2012-07-09 MED ORDER — WITCH HAZEL-GLYCERIN EX PADS: 1.0000 "application " | MEDICATED_PAD | CUTANEOUS | Status: DC | PRN

## 2012-07-09 MED ORDER — NALBUPHINE HCL 10 MG/ML IJ SOLN
5.0000 mg | INTRAMUSCULAR | Status: DC | PRN
  Administered 2012-07-09: 10 mg via INTRAVENOUS
  Administered 2012-07-09: 5 mg via INTRAVENOUS
  Filled 2012-07-09 (×3): qty 1

## 2012-07-09 MED ORDER — LACTATED RINGERS IV SOLN
INTRAVENOUS | Status: DC | PRN
  Administered 2012-07-09 (×4): via INTRAVENOUS

## 2012-07-09 MED ORDER — INDIGOTINDISULFONATE SODIUM 8 MG/ML IJ SOLN
INTRAMUSCULAR | Status: DC | PRN
  Administered 2012-07-09: 5 mL via INTRAVENOUS

## 2012-07-09 MED ORDER — MEDROXYPROGESTERONE ACETATE 150 MG/ML IM SUSP: 150.0000 mg | INTRAMUSCULAR | Status: DC | PRN

## 2012-07-09 MED ORDER — ZOLPIDEM TARTRATE 5 MG PO TABS: 5.0000 mg | ORAL_TABLET | Freq: Every evening | ORAL | Status: DC | PRN

## 2012-07-09 MED ORDER — OXYTOCIN 10 UNIT/ML IJ SOLN
INTRAMUSCULAR | Status: AC
  Filled 2012-07-09: qty 4

## 2012-07-09 MED ORDER — SODIUM CHLORIDE 0.9 % IV SOLN: 1.0000 ug/kg/h | INTRAVENOUS | Status: DC | PRN

## 2012-07-09 MED ORDER — ONDANSETRON HCL 4 MG/2ML IJ SOLN
INTRAMUSCULAR | Status: AC
  Filled 2012-07-09: qty 2

## 2012-07-09 MED ORDER — OXYTOCIN 40 UNITS IN LACTATED RINGERS INFUSION - SIMPLE MED
INTRAVENOUS | Status: DC | PRN
  Administered 2012-07-09: 40 [IU] via INTRAVENOUS

## 2012-07-09 MED ORDER — IBUPROFEN 600 MG PO TABS
600.0000 mg | ORAL_TABLET | Freq: Four times a day (QID) | ORAL | Status: DC
  Administered 2012-07-10: 600 mg via ORAL
  Filled 2012-07-09: qty 1

## 2012-07-09 MED ORDER — KETOROLAC TROMETHAMINE 30 MG/ML IJ SOLN: 30.0000 mg | Freq: Four times a day (QID) | INTRAMUSCULAR | Status: AC | PRN

## 2012-07-09 MED ORDER — PHENYLEPHRINE HCL 10 MG/ML IJ SOLN
INTRAMUSCULAR | Status: DC | PRN
  Administered 2012-07-09 (×2): 80 ug via INTRAVENOUS

## 2012-07-09 MED ORDER — SCOPOLAMINE 1 MG/3DAYS TD PT72
MEDICATED_PATCH | TRANSDERMAL | Status: AC
  Administered 2012-07-09: 1.5 mg
  Filled 2012-07-09: qty 1

## 2012-07-09 MED ORDER — FENTANYL CITRATE 0.05 MG/ML IJ SOLN
INTRAMUSCULAR | Status: AC
  Filled 2012-07-09: qty 2

## 2012-07-09 MED ORDER — LACTATED RINGERS IV SOLN
INTRAVENOUS | Status: DC | PRN
  Administered 2012-07-09: 13:00:00 via INTRAVENOUS

## 2012-07-09 MED ORDER — EPHEDRINE SULFATE 50 MG/ML IJ SOLN
INTRAMUSCULAR | Status: DC | PRN
  Administered 2012-07-09: 5 mg via INTRAVENOUS

## 2012-07-09 MED ORDER — SCOPOLAMINE 1 MG/3DAYS TD PT72: 1.0000 | MEDICATED_PATCH | Freq: Once | TRANSDERMAL | Status: DC

## 2012-07-09 MED ORDER — ONDANSETRON HCL 4 MG/2ML IJ SOLN: 4.0000 mg | INTRAMUSCULAR | Status: DC | PRN

## 2012-07-09 MED ORDER — METOCLOPRAMIDE HCL 5 MG/ML IJ SOLN: 10.0000 mg | Freq: Three times a day (TID) | INTRAMUSCULAR | Status: DC | PRN

## 2012-07-09 MED ORDER — KETOROLAC TROMETHAMINE 30 MG/ML IJ SOLN
INTRAMUSCULAR | Status: AC
  Administered 2012-07-09: 30 mg via INTRAVENOUS
  Filled 2012-07-09: qty 1

## 2012-07-09 MED ORDER — MEPERIDINE HCL 25 MG/ML IJ SOLN: 6.2500 mg | INTRAMUSCULAR | Status: DC | PRN

## 2012-07-09 MED ORDER — MENTHOL 3 MG MT LOZG: 1.0000 | LOZENGE | OROMUCOSAL | Status: DC | PRN

## 2012-07-09 MED ORDER — BUPIVACAINE-EPINEPHRINE 0.5% -1:200000 IJ SOLN
INTRAMUSCULAR | Status: DC | PRN
  Administered 2012-07-09: 10 mL

## 2012-07-09 MED ORDER — BUPIVACAINE IN DEXTROSE 0.75-8.25 % IT SOLN
INTRATHECAL | Status: DC | PRN
  Administered 2012-07-09: 1.6 mL via INTRATHECAL

## 2012-07-09 MED ORDER — BUPIVACAINE-EPINEPHRINE (PF) 0.5% -1:200000 IJ SOLN
INTRAMUSCULAR | Status: AC
  Filled 2012-07-09: qty 10

## 2012-07-09 MED ORDER — MIDAZOLAM HCL 5 MG/5ML IJ SOLN
INTRAMUSCULAR | Status: DC | PRN
  Administered 2012-07-09 (×2): 1 mg via INTRAVENOUS

## 2012-07-09 MED ORDER — ACETAMINOPHEN 10 MG/ML IV SOLN
1000.0000 mg | Freq: Four times a day (QID) | INTRAVENOUS | Status: AC | PRN
  Administered 2012-07-09: 1000 mg via INTRAVENOUS
  Filled 2012-07-09: qty 100

## 2012-07-09 MED ORDER — BUPIVACAINE HCL (PF) 0.5 % IJ SOLN
INTRAMUSCULAR | Status: AC
  Filled 2012-07-09: qty 30

## 2012-07-09 MED ORDER — MORPHINE SULFATE (PF) 0.5 MG/ML IJ SOLN
INTRAMUSCULAR | Status: DC | PRN
  Administered 2012-07-09: .1 mg via INTRATHECAL
  Administered 2012-07-09: 4.9 mg via EPIDURAL

## 2012-07-09 MED ORDER — KETOROLAC TROMETHAMINE 30 MG/ML IJ SOLN
30.0000 mg | Freq: Four times a day (QID) | INTRAMUSCULAR | Status: AC | PRN
  Administered 2012-07-09 (×2): 30 mg via INTRAVENOUS
  Filled 2012-07-09: qty 1

## 2012-07-09 MED ORDER — LACTATED RINGERS IV SOLN
INTRAVENOUS | Status: DC
  Administered 2012-07-09: 23:00:00 via INTRAVENOUS

## 2012-07-09 MED ORDER — INFLUENZA VIRUS VACC SPLIT PF IM SUSP: 0.5000 mL | INTRAMUSCULAR | Status: DC

## 2012-07-09 MED ORDER — DIPHENHYDRAMINE HCL 50 MG/ML IJ SOLN: 25.0000 mg | INTRAMUSCULAR | Status: DC | PRN

## 2012-07-09 MED ORDER — LACTATED RINGERS IV SOLN
Freq: Once | INTRAVENOUS | Status: AC
  Administered 2012-07-09: 10:00:00 via INTRAVENOUS

## 2012-07-09 MED ORDER — DIPHENHYDRAMINE HCL 50 MG/ML IJ SOLN
INTRAMUSCULAR | Status: AC
  Filled 2012-07-09: qty 1

## 2012-07-09 MED ORDER — PROMETHAZINE HCL 25 MG/ML IJ SOLN: 6.2500 mg | INTRAMUSCULAR | Status: DC | PRN

## 2012-07-09 MED ORDER — PRENATAL MULTIVITAMIN CH
1.0000 | ORAL_TABLET | Freq: Every day | ORAL | Status: DC
  Administered 2012-07-11 – 2012-07-12 (×2): 1 via ORAL
  Filled 2012-07-09 (×5): qty 1

## 2012-07-09 MED ORDER — DIPHENHYDRAMINE HCL 25 MG PO CAPS
25.0000 mg | ORAL_CAPSULE | ORAL | Status: DC | PRN
  Administered 2012-07-10 (×2): 25 mg via ORAL
  Filled 2012-07-09 (×3): qty 1

## 2012-07-09 MED ORDER — PNEUMOCOCCAL VAC POLYVALENT 25 MCG/0.5ML IJ INJ
0.5000 mL | INJECTION | INTRAMUSCULAR | Status: AC
  Filled 2012-07-09: qty 0.5

## 2012-07-09 SURGICAL SUPPLY — 43 items
CLOTH BEACON ORANGE TIMEOUT ST (SAFETY) ×2 IMPLANT
CONTAINER PREFILL 10% NBF 15ML (MISCELLANEOUS) IMPLANT
DRAIN CHANNEL 10F 3/8 F FF (DRAIN) ×2 IMPLANT
DRAIN CHANNEL 10M FLAT 3/4 FLT (DRAIN) ×2 IMPLANT
DRAIN JACKSON PRT FLT 7MM (DRAIN) IMPLANT
DRAPE SURG 17X23 STRL (DRAPES) ×2 IMPLANT
DRESSING TELFA 8X3 (GAUZE/BANDAGES/DRESSINGS) ×2 IMPLANT
DRSG COVADERM 4X10 (GAUZE/BANDAGES/DRESSINGS) IMPLANT
DURAPREP 26ML APPLICATOR (WOUND CARE) ×2 IMPLANT
ELECT REM PT RETURN 9FT ADLT (ELECTROSURGICAL) ×2
ELECTRODE REM PT RTRN 9FT ADLT (ELECTROSURGICAL) ×1 IMPLANT
EVACUATOR SILICONE 100CC (DRAIN) ×2 IMPLANT
EXTRACTOR VACUUM M CUP 4 TUBE (SUCTIONS) ×2 IMPLANT
GAUZE SPONGE 4X4 12PLY STRL LF (GAUZE/BANDAGES/DRESSINGS) ×4 IMPLANT
GLOVE BIOGEL PI IND STRL 8.5 (GLOVE) ×1 IMPLANT
GLOVE BIOGEL PI INDICATOR 8.5 (GLOVE) ×1
GLOVE ECLIPSE 8.0 STRL XLNG CF (GLOVE) ×4 IMPLANT
GOWN PREVENTION PLUS LG XLONG (DISPOSABLE) ×4 IMPLANT
GOWN PREVENTION PLUS XXLARGE (GOWN DISPOSABLE) ×2 IMPLANT
KIT ABG SYR 3ML LUER SLIP (SYRINGE) IMPLANT
NEEDLE HYPO 25X1 1.5 SAFETY (NEEDLE) ×2 IMPLANT
NEEDLE HYPO 25X5/8 SAFETYGLIDE (NEEDLE) IMPLANT
PACK C SECTION WH (CUSTOM PROCEDURE TRAY) ×2 IMPLANT
PAD ABD 7.5X8 STRL (GAUZE/BANDAGES/DRESSINGS) ×2 IMPLANT
PAD OB MATERNITY 4.3X12.25 (PERSONAL CARE ITEMS) IMPLANT
RINGERS IRRIG 1000ML POUR BTL (IV SOLUTION) ×2 IMPLANT
SET CYSTO W/LG BORE CLAMP LF (SET/KITS/TRAYS/PACK) ×2 IMPLANT
SLEEVE SCD COMPRESS KNEE MED (MISCELLANEOUS) IMPLANT
STAPLER VISISTAT 35W (STAPLE) IMPLANT
SUT MNCRL AB 3-0 PS2 27 (SUTURE) IMPLANT
SUT PLAIN 0 NONE (SUTURE) IMPLANT
SUT SILK 3 0 FS 1X18 (SUTURE) IMPLANT
SUT VIC AB 0 CT1 27 (SUTURE) ×2
SUT VIC AB 0 CT1 27XBRD ANBCTR (SUTURE) ×2 IMPLANT
SUT VIC AB 2-0 CTX 36 (SUTURE) ×4 IMPLANT
SUT VIC AB 3-0 CT1 27 (SUTURE)
SUT VIC AB 3-0 CT1 TAPERPNT 27 (SUTURE) IMPLANT
SUT VIC AB 3-0 SH 27 (SUTURE)
SUT VIC AB 3-0 SH 27X BRD (SUTURE) IMPLANT
SYR CONTROL 10ML LL (SYRINGE) ×2 IMPLANT
TOWEL OR 17X24 6PK STRL BLUE (TOWEL DISPOSABLE) ×4 IMPLANT
TRAY FOLEY CATH 14FR (SET/KITS/TRAYS/PACK) ×2 IMPLANT
WATER STERILE IRR 1000ML POUR (IV SOLUTION) ×2 IMPLANT

## 2012-07-09 NOTE — Anesthesia Procedure Notes (Signed)
Spinal  Patient location during procedure: OR Start time: August 03, 2012 12:54 PM Staffing Anesthesiologist: Brayton Caves R Performed by: anesthesiologist  Preanesthetic Checklist Completed: patient identified, site marked, surgical consent, pre-op evaluation, timeout performed, IV checked, risks and benefits discussed and monitors and equipment checked Spinal Block Patient position: sitting Prep: DuraPrep Patient monitoring: heart rate, cardiac monitor, continuous pulse ox and blood pressure Approach: midline Location: L3-4 Injection technique: single-shot Needle Needle type: Sprotte  Needle gauge: 24 G Needle length: 9 cm Assessment Sensory level: T4 Additional Notes Patient identified.  Risk benefits discussed including failed block, incomplete pain control, headache, nerve damage, paralysis, blood pressure changes, nausea, vomiting, reactions to medication both toxic or allergic, and postpartum back pain.  Patient expressed understanding and wished to proceed.  All questions were answered.  Sterile technique used throughout procedure.  CSF was clear.  No parasthesia or other complications.  Please see nursing notes for vital signs.

## 2012-07-09 NOTE — Op Note (Signed)
OPERATIVE NOTE  Patient's Name: [name]  Date of Birth: August 04, 1981   Medical Records Number: [id number]   Date of Operation: 06/18/2012   Preoperative diagnosis:  [redacted]w[redacted]d weeks gestation  Prior Cesarean Section  Desires Repeat Cesarean Section  Obesity  Asthma  Anemia  History of a SIDS death at 51 months of age  Postoperative diagnosis:  Same  Bleeding from the left lateral uterine incision  Procedure:  Repeat low transverse cesarean section  Placement of a Jackson-Pratt drain  Cystoscopy  Surgeon:  Leonard Schwartz, M.D.  Assistant:  Lavera Guise, certified nurse midwife  Anesthesia:  Spinal  Disposition:  [name] is a 31 y.o. female, [redacted]w[redacted]d, who presents at [redacted]w[redacted]d weeks gestation. The patient has been followed at the [facility] obstetrics and gynecology division of [facility] health care for women. This pregnancy has been complicated by a prior cesarean section. The patient desires a repeat cesarean section. She understands the indications for her procedure and she accepts the risk of, but not limited to, anesthetic complications, bleeding, infections, and possible damage to the surrounding organs.  Findings:  A  6 lbs. 5 oz. female ([name]) was delivered from a occiput anterior position.  The Apgar scores were 9/9. The uterus, fallopian tubes, and ovaries were normal for the gravid state.  Procedure:  The patient was taken to the operating room where a spinal anesthetic was given.The perineum was prepped with betadine. A Foley catheter was placed in the bladder.The patient's abdomen was prepped with Duraprep.   The patient was sterilely draped. The lower abdomen was injected with half percent Marcaine with epinephrine. A low transverse incision was made in the abdomen and carried sharply through the subcutaneous tissue, the fascia, and the anterior peritoneum. An incision was made in the lower uterine segment. The incision was  extended in a low transverse fashion. The membranes were ruptured. The fetal head was delivered with the assistance of a vacuum extractor. The mouth and nose were suctioned. The remainder of the infant was then delivered. The cord was clamped and cut. The infant was handed to the awaiting pediatric team. The placenta was removed. The uterine cavity was cleaned of amniotic fluid, clotted blood, and membranes. The uterine incision was closed using a running locking suture of 2-0 Vicryl. An imbricating suture of 2-0 Vicryl was placed. Bleeding was noted from the left lateral incision of the uterus. Multiple figure-of-eight sutures were required to obtain hemostasis. Care was taken not to damage the vessels or the ureter on the left. The pelvis was vigorously irrigated. Hemostasis was adequate. The anterior peritoneum and the abdominal musculature were closed using 2-0 Vicryl. The fascia was closed using a running suture of 0 Vicryl followed by 3 interrupted sutures of 0 Vicryl. A Jackson-Pratt drain was placed in the subcutaneous layer and brought out the left lower quadrant. The drain was sutured into place using 2-0 silk. The subcutaneous layer was closed using interrupted sutures of 3-0 plain catgut. The skin was reapproximated using a subcuticular suture of 3-0 Monocryl. Sponge, needle, and instrument counts were correct on 2 occasions. The estimated blood loss for the procedure was 600 cc. The patient tolerated her procedure well. A sterile dressing was applied. The patient was placed in a lithotomy position. A Foley catheter was removed. The perineum was prepped with multiple layers of Betadine. A cystoscope was inserted in the bladder. The patient was given Indigo Carmine. Blue dye was noted to pass through both ureteral orifices. No damage  or pathology was noted to the inner bladder. The cystoscope was removed. Another Foley catheter was placed. She was transported to the recovery room in stable condition. The  infant was taken to the full-term nursery. An outpatient circumcision is planned. The placenta was sent to labor and delivery.  Leonard Schwartz, M.D.

## 2012-07-09 NOTE — Anesthesia Preprocedure Evaluation (Signed)
Anesthesia Evaluation  Patient identified by MRN, date of birth, ID band Patient awake    Reviewed: Allergy & Precautions, H&P , NPO status , Patient's Chart, lab work & pertinent test results  Airway Mallampati: III      Dental No notable dental hx.    Pulmonary neg pulmonary ROS, asthma ,  breath sounds clear to auscultation  Pulmonary exam normal       Cardiovascular Exercise Tolerance: Good negative cardio ROS  Rhythm:regular Rate:Normal     Neuro/Psych negative neurological ROS  negative psych ROS   GI/Hepatic negative GI ROS, Neg liver ROS,   Endo/Other  negative endocrine ROSMorbid obesity  Renal/GU negative Renal ROS  negative genitourinary   Musculoskeletal   Abdominal Normal abdominal exam  (+)   Peds  Hematology negative hematology ROS (+)   Anesthesia Other Findings History of chlamydia 12/27/2000   H/O rubella        H/O varicella     H/O cystitis 2002      H/O pyelonephritis 2002   Abnormal Pap smear 2011      Vaginal bleeding in pregnancy 2013   Pain pelvic 2002      Dysplasia of cervix, low grade (CIN 1)     Monilial vaginitis 08/2006      History of PID 03/29/2006   Asthma    Reproductive/Obstetrics (+) Pregnancy                           Anesthesia Physical Anesthesia Plan  ASA: III  Anesthesia Plan: Spinal   Post-op Pain Management:    Induction:   Airway Management Planned:   Additional Equipment:   Intra-op Plan:   Post-operative Plan:   Informed Consent: I have reviewed the patients History and Physical, chart, labs and discussed the procedure including the risks, benefits and alternatives for the proposed anesthesia with the patient or authorized representative who has indicated his/her understanding and acceptance.     Plan Discussed with: Anesthesiologist, CRNA and Surgeon  Anesthesia Plan Comments:         Anesthesia Quick Evaluation

## 2012-07-09 NOTE — Anesthesia Postprocedure Evaluation (Signed)
Anesthesia Post Note  Patient: [name]  Procedure(s) Performed: Procedure(s) (LRB): CESAREAN SECTION (N/A) CYSTOSCOPY (N/A)  Anesthesia type: Spinal  Patient location: PACU  Post pain: Pain level controlled  Post assessment: Post-op Vital signs reviewed  Last Vitals:  Filed Vitals:   07/08/2012 1500  BP: 111/97  Pulse: 93  Temp:   Resp: 22    Post vital signs: Reviewed  Level of consciousness: awake  Complications: No apparent anesthesia complications

## 2012-07-09 NOTE — Transfer of Care (Signed)
Immediate Anesthesia Transfer of Care Note  Patient: [name]  Procedure(s) Performed: Procedure(s) (LRB) with comments: CESAREAN SECTION (N/A) CYSTOSCOPY (N/A)  Patient Location: PACU  Anesthesia Type:Spinal  Level of Consciousness: awake, alert  and oriented  Airway & Oxygen Therapy: Patient Spontanous Breathing  Post-op Assessment: Report given to PACU RN and Post -op Vital signs reviewed and stable  Post vital signs: Reviewed and stable  Complications: No apparent anesthesia complications

## 2012-07-09 NOTE — H&P (Signed)
The patient was interviewed and examined today.  The previously documented history and physical examination was reviewed. There are no changes. The operative procedure was reviewed. The risks and benefits were outlined again. The specific risks include, but are not limited to, anesthetic complications, bleeding, infections, and possible damage to the surrounding organs. The patient's questions were answered.  We are ready to proceed as outlined. The likelihood of the patient achieving the goals of this procedure is very likely.   CBC    Component Value Date/Time   WBC 6.4 07/02/2012 1151   RBC 4.00 07/02/2012 1151   HGB 11.1* 07/02/2012 1151   HCT 34.5* 07/02/2012 1151   PLT 206 07/02/2012 1151   MCV 86.3 07/02/2012 1151   MCH 27.8 07/02/2012 1151   MCHC 32.2 07/02/2012 1151   RDW 14.8 07/02/2012 1151   LYMPHSABS 2.7 07/02/2010 1605   MONOABS 0.4 07/02/2010 1605   EOSABS 0.1 07/02/2010 1605   BASOSABS 0.0 07/02/2010 1605      Leonard Schwartz, M.D.

## 2012-07-10 ENCOUNTER — Encounter (HOSPITAL_COMMUNITY): Payer: Self-pay | Admitting: Obstetrics and Gynecology

## 2012-07-10 LAB — CBC
Hemoglobin: 9 g/dL — ABNORMAL LOW (ref 12.0–15.0)
MCH: 28.8 pg (ref 26.0–34.0)
MCV: 88.5 fL (ref 78.0–100.0)
Platelets: 184 10*3/uL (ref 150–400)
RBC: 3.13 MIL/uL — ABNORMAL LOW (ref 3.87–5.11)
WBC: 7.1 10*3/uL (ref 4.0–10.5)

## 2012-07-10 LAB — CCBB MATERNAL DONOR DRAW

## 2012-07-10 MED ORDER — KETOROLAC TROMETHAMINE 10 MG PO TABS
10.0000 mg | ORAL_TABLET | Freq: Three times a day (TID) | ORAL | Status: DC
  Administered 2012-07-10 – 2012-07-12 (×6): 10 mg via ORAL
  Filled 2012-07-10 (×10): qty 1

## 2012-07-10 MED ORDER — HYDROMORPHONE HCL 2 MG PO TABS: 2.0000 mg | ORAL_TABLET | Freq: Two times a day (BID) | ORAL | Status: DC | PRN

## 2012-07-10 NOTE — Progress Notes (Signed)
UR Chart review completed.  

## 2012-07-10 NOTE — Progress Notes (Signed)
Notified Dr Jean Rosenthal patient's pain score is 8 and wanting something for pain.  Orders given to hang Tylenol drip

## 2012-07-10 NOTE — Progress Notes (Signed)
Subjective: Postpartum Day 1 Cesarean Delivery Patient reports incisional pain, tolerating PO, + flatus and no problems voiding.  C/o meds not working. S: comfortable, little bleeding, slept     breastfeeding Hemoglobin & Hematocrit     Component Value Date/Time   HGB 9.0* 07/10/2012 0530   HCT 27.7* 07/10/2012 0530    Temp:  [97.4 F (36.3 C)-98.6 F (37 C)] 97.8 F (36.6 C) (10/31 0925) Pulse Rate:  [76-109] 76  (10/31 0925) Resp:  [14-23] 20  (10/31 0925) BP: (93-128)/(61-97) 128/72 mmHg (10/31 0925) SpO2:  [96 %-100 %] 98 % (10/31 0925)  Physical Exam:  General: alert, cooperative and no distress Lochia: appropriate Uterine Fundus: firm Incision: well approximated no redness, edema, or drainage, JP with red discharge DVT Evaluation: Negative Homan's sign. No significant calf/ankle edema. Lungs clear bilaterally AP RRR Bowel sounds hypoactive abd nontympanic A normal involution     Lactating     PO day 1     Normal involution P tordhl given motrin discontinued, added dilaudid, continue care Lavera Guise, CNM

## 2012-07-10 NOTE — Progress Notes (Signed)
Sw aware of consult however will not address this past issue unless pt displays symptoms. Please re-consult if depression symptoms arise or at pt's request.      

## 2012-07-11 DIAGNOSIS — 419620001 Death: Secondary | SNOMED CT

## 2012-07-11 NOTE — Progress Notes (Addendum)
Post Partum Day 2:S/P repeat C/S Subjective: Patient up ad lib, denies syncope or dizziness.  Pain better controlled with Dilaudid.  Itching resolved. Feeding:  Breast and bottle Contraceptive plan:   Nexplanon  Objective: Blood pressure 90/59, pulse 80, temperature 98 F (36.7 C), temperature source Oral, resp. rate 20, height 5\' 4"  (1.626 m), weight 257 lb (116.574 kg), last menstrual period 09/04/2011, SpO2 100.00%, unknown if currently breastfeeding.  Physical Exam:  General: alert Lochia: appropriate Uterine Fundus: firm Incision: healing well DVT Evaluation: No evidence of DVT seen on physical exam. Negative Homan's sign. JP drain:  7 cc overnight, with 22 cc total for 24 hours--serosanguinous drainage  Basename 07/10/12 0530  HGB 9.0*  HCT 27.7*    Assessment/Plan: S/P Vaginal delivery day 2 Continue current care Plan for discharge tomorrow Plans outpatient circumcision.   LOS: 2 days   Nigel Bridgeman 07/11/2012, 8:46 AM

## 2012-07-11 DEATH — deceased

## 2012-07-12 MED ORDER — OXYCODONE-ACETAMINOPHEN 5-325 MG PO TABS
1.0000 | ORAL_TABLET | ORAL | Status: DC | PRN
Start: 2012-07-12 — End: 2013-04-22

## 2012-07-12 MED ORDER — INTEGRA F 125-1 MG PO CAPS: 1.0000 | ORAL_CAPSULE | Freq: Every day | ORAL | Status: DC

## 2012-07-12 MED ORDER — KETOROLAC TROMETHAMINE 10 MG PO TABS: 10.0000 mg | ORAL_TABLET | Freq: Four times a day (QID) | ORAL | Status: DC | PRN

## 2012-07-12 MED ORDER — DOCUSATE SODIUM 100 MG PO CAPS: 100.0000 mg | ORAL_CAPSULE | Freq: Two times a day (BID) | ORAL | Status: DC

## 2012-07-12 MED ORDER — KETOROLAC TROMETHAMINE 10 MG PO TABS: 10.0000 mg | ORAL_TABLET | ORAL | Status: DC | PRN

## 2012-07-12 NOTE — Discharge Summary (Signed)
Obstetric Discharge Summary Reason for Admission: cesarean section repeat  Prenatal Procedures: NST and ultrasound Intrapartum Procedures: cesarean: low cervical, transverse Postpartum Procedures: none Complications-Operative and Postpartum: none Hemoglobin  Date Value Range Status  07/10/2012 9.0* 12.0 - 15.0 g/dL Final     HCT  Date Value Range Status  07/10/2012 27.7* 36.0 - 46.0 % Final    Hospital course: Pt arrived on 10-30 in the AM for scheduled repeat c/s. She had a routine op course receiving spinal anesthesia w duramorph. She had increased pain on day 1 and dilauded PO was given w good results. She otherwise had a routine PP course with Breastfeeding going well. She also got better pain relief on toradol compared to motrin. Pt planned nexplanon for contraception.   Physical Exam:  General: alert and no distress Lochia: appropriate Uterine Fundus: firm Incision: healing well, JP drain removed without difficulty, scant drainage DVT Evaluation: No evidence of DVT seen on physical exam. Negative Homan's sign. No significant calf/ankle edema.  Discharge Diagnoses: Term Pregnancy-delivered and PP mild anemia   Discharge Information: Date: 07/12/2012 Activity: pelvic rest Diet: routine and iron rich  Medications: PNV, Percocet and toradol x 2 days, then switch to motrin 600mg  every 6 hours PRN and iron  Condition: stable Instructions: refer to practice specific booklet and printed discharge instructions per epic Discharge to: home Follow-up Information    Follow up with [facility] & Gy[facility]. In 4 weeks. (make appointment for 4-5 weeks for PP eval, and let the office know you would like nexplanon )    Contact information:   [address] [place] [address] [phone]         Newborn Data: Live born female  Birth Weight: 6 lb 5.8 oz (2885 g) APGAR: 9, 9  Home with mother.  Brennon Otterness M 07/12/2012, 9:02  PM

## 2012-07-13 LAB — TYPE AND SCREEN

## 2012-07-21 ENCOUNTER — Ambulatory Visit (INDEPENDENT_AMBULATORY_CARE_PROVIDER_SITE_OTHER): Payer: Medicaid Other | Admitting: Obstetrics and Gynecology

## 2012-07-21 ENCOUNTER — Encounter: Payer: Self-pay | Admitting: Obstetrics and Gynecology

## 2012-07-21 VITALS — BP 110/78 | HR 76 | Wt 250.0 lb

## 2012-07-21 DIAGNOSIS — G8918 Other acute postprocedural pain: Secondary | ICD-10-CM

## 2012-07-21 MED ORDER — OXYCODONE-ACETAMINOPHEN 5-325 MG PO TABS: 1.0000 | ORAL_TABLET | ORAL | Status: DC | PRN

## 2012-07-21 NOTE — Progress Notes (Signed)
Pt is 12 days pp. Pt needs a rx for pain .   Percocet 5/325:1 or 2 tablets every 4 hours as needed for pain.  #40.  No refills.  Return to office in 4 weeks  Dr. Stefano Gaul

## 2012-08-25 ENCOUNTER — Ambulatory Visit (INDEPENDENT_AMBULATORY_CARE_PROVIDER_SITE_OTHER): Payer: Medicaid Other | Admitting: Obstetrics and Gynecology

## 2012-08-25 ENCOUNTER — Encounter: Payer: Self-pay | Admitting: Obstetrics and Gynecology

## 2012-08-25 ENCOUNTER — Telehealth: Payer: Self-pay | Admitting: Obstetrics and Gynecology

## 2012-08-25 NOTE — Progress Notes (Signed)
   HISTORY OF PRESENT ILLNESS  [name] is a 31 y.o. year old female,[id number], who presents for a problem visit. The patient had a cesarean section on 07/21/12.  Subjective:  She is doing okay.  She wants a Nexplanon.  She is feeling more anxious.  She has a history of the same.  Objective:  BP 94/62  Temp 98.1 F (36.7 C) (Oral)  Wt 253 lb (114.76 kg)  Breastfeeding? Yes   General: no distress Resp: clear to auscultation bilaterally Cardio: regular rate and rhythm, S1, S2 normal, no murmur, click, rub or gallop GI: incision: clean, dry and intact and soft and nontender  External genitalia: normal general appearance Vaginal: normal without tenderness, induration or masses Cervix: nontender Adnexa: normal bimanual exam Uterus: normal size, nontender  EPDS: 14  Assessment:  Well status post cesarean section  Anxiety  Desires contraception  Plan:  We'll schedule Nexplanon  Will arrange evaluation at the [facility]  Return to office in 1 week(s).   Leonard Schwartz M.D.  August 25, 2012  Date of delivery: 2012-07-21 Female Name: [name] Vaginal delivery:no Cesarean section:yes Tubal ligation:no GDM:no Breast Feeding:yes Bottle Feeding:yes Post-Partum Blues: yes Abnormal pap:yes years ago Normal GU function: no Pt states sometimes it is hard to feel when she has to urinate Normal GI function:yes Returning to work:yes EPDS: 14

## 2012-08-26 NOTE — Telephone Encounter (Signed)
avs pt 

## 2012-08-27 ENCOUNTER — Ambulatory Visit (INDEPENDENT_AMBULATORY_CARE_PROVIDER_SITE_OTHER): Payer: Medicaid Other | Admitting: Obstetrics and Gynecology

## 2012-08-27 VITALS — BP 94/76 | Wt 253.0 lb

## 2012-08-27 DIAGNOSIS — Z309 Encounter for contraceptive management, unspecified: Secondary | ICD-10-CM

## 2012-08-27 DIAGNOSIS — Z30017 Encounter for initial prescription of implantable subdermal contraceptive: Secondary | ICD-10-CM

## 2012-08-27 MED ORDER — ETONOGESTREL 68 MG ~~LOC~~ IMPL
68.0000 mg | DRUG_IMPLANT | Freq: Once | SUBCUTANEOUS | Status: AC
  Administered 2012-08-27: 68 mg via SUBCUTANEOUS

## 2012-08-27 NOTE — Progress Notes (Signed)
Procedure to Insert Nexplanon  Procedure reviewed with the patient. Questions were answered.  A permit was signed. Urine pregnancy test is negative.  The left upper arm was prepped with multiple layers of Betadine.  The medial portion was injected with 3 cc of buffered 1% lidocaine. The Nexplanon was inserted according to protocol.  The patient tolerated the procedure well.  There were no complications.  Hemostasis was achieved using pressure.  The upper arm was wrapped with sterile gauze.  The patient will return for followup visit in 6 weeks.  She will call for questions or concerns.  Instructions were given to the patient for caring for her arm.  Leonard Schwartz M.D.   LMP: prior to pregnancy INSERTION DATE: 08/27/2012 REMOVAL DATE: December 2016 INSERTION ARM: left PALPATED AFTER INSERT: yes IS PT SWITCHING FROM HORMONAL BC: no LOT #: [id number] EXP: 11/2014

## 2012-09-02 ENCOUNTER — Telehealth: Payer: Self-pay | Admitting: Obstetrics and Gynecology

## 2012-09-02 NOTE — Telephone Encounter (Signed)
Tc to pt per AVS to refer pt for counseling for PP depression.  Pt has pregnancy Medicaid and the [facility] does not take that insurance.  After looking into other resources, pt advised of [facility], a facility that the pt can walk into btn the hours of [phone] for counseling and if any meds needed they have the resources to refer pt to someone who can rx her meds.  Pt states she will go as she is familiar with that facility.  Pt encouraged to go and denies any SI/HI, but just feeling sad and crying a lot.  Pt advised to call back with any concerns.  Pt states she will try to get herself some insurance so that she can be referred to another facility.

## 2013-01-26 ENCOUNTER — Emergency Department (HOSPITAL_COMMUNITY)
Admission: EM | Admit: 2013-01-26 | Discharge: 2013-01-26 | Disposition: A | Discharge status: Home / Self Care | Payer: Self-pay | Attending: Emergency Medicine | Admitting: Emergency Medicine

## 2013-01-26 ENCOUNTER — Encounter (HOSPITAL_COMMUNITY): Payer: Self-pay | Admitting: Emergency Medicine

## 2013-01-26 DIAGNOSIS — R059 Cough, unspecified: Secondary | ICD-10-CM | POA: Insufficient documentation

## 2013-01-26 DIAGNOSIS — Z8742 Personal history of other diseases of the female genital tract: Secondary | ICD-10-CM | POA: Insufficient documentation

## 2013-01-26 DIAGNOSIS — R599 Enlarged lymph nodes, unspecified: Secondary | ICD-10-CM | POA: Insufficient documentation

## 2013-01-26 DIAGNOSIS — R6889 Other general symptoms and signs: Secondary | ICD-10-CM | POA: Insufficient documentation

## 2013-01-26 DIAGNOSIS — Z8619 Personal history of other infectious and parasitic diseases: Secondary | ICD-10-CM | POA: Insufficient documentation

## 2013-01-26 DIAGNOSIS — J45909 Unspecified asthma, uncomplicated: Secondary | ICD-10-CM | POA: Insufficient documentation

## 2013-01-26 DIAGNOSIS — J069 Acute upper respiratory infection, unspecified: Secondary | ICD-10-CM | POA: Insufficient documentation

## 2013-01-26 DIAGNOSIS — R5381 Other malaise: Secondary | ICD-10-CM | POA: Insufficient documentation

## 2013-01-26 DIAGNOSIS — Z87891 Personal history of nicotine dependence: Secondary | ICD-10-CM | POA: Insufficient documentation

## 2013-01-26 DIAGNOSIS — Z79899 Other long term (current) drug therapy: Secondary | ICD-10-CM | POA: Insufficient documentation

## 2013-01-26 DIAGNOSIS — R05 Cough: Secondary | ICD-10-CM | POA: Insufficient documentation

## 2013-01-26 DIAGNOSIS — Z87448 Personal history of other diseases of urinary system: Secondary | ICD-10-CM | POA: Insufficient documentation

## 2013-01-26 DIAGNOSIS — R609 Edema, unspecified: Secondary | ICD-10-CM | POA: Insufficient documentation

## 2013-01-26 MED ORDER — IBUPROFEN 800 MG PO TABS
800.0000 mg | ORAL_TABLET | Freq: Once | ORAL | Status: AC
  Administered 2013-01-26: 800 mg via ORAL
  Filled 2013-01-26: qty 1

## 2013-01-26 NOTE — ED Provider Notes (Signed)
History    CSN: [id number] Arrival date & time 01/26/13  0957  First MD Initiated Contact with Patient 01/26/13 1024    Chief Complaint  Patient presents with  . head congestion    Patient is a 32 y.o. female presenting with URI. The history is provided by the patient.  URI Presenting symptoms: congestion, cough, fatigue and rhinorrhea   Presenting symptoms: no ear pain, no facial pain, no fever and no sore throat   Congestion:    Location:  Nasal   Interferes with sleep: yes     Interferes with eating/drinking: no   Cough:    Cough characteristics:  Non-productive   Sputum characteristics:  Nondescript   Severity:  Mild   Onset quality:  Gradual   Duration:  2 days   Timing:  Intermittent   Progression:  Worsening   Chronicity:  New Severity:  Moderate Onset quality:  Sudden Duration:  2 days Timing:  Constant Progression:  Worsening Chronicity:  New Relieved by:  None tried Worsened by:  Nothing tried Ineffective treatments:  None tried Associated symptoms: sinus pain and sneezing   Associated symptoms: no arthralgias, no headaches, no myalgias, no neck pain, no swollen glands and no wheezing   Risk factors: sick contacts   Risk factors: no chronic cardiac disease, no chronic kidney disease, no chronic respiratory disease, no diabetes mellitus, no recent illness and no recent travel     Past Medical History  Diagnosis Date  . History of chlamydia 12/27/2000  . H/O rubella   . H/O varicella   . H/O cystitis 2002  . H/O pyelonephritis 2002  . Abnormal Pap smear 2011  . Vaginal bleeding in pregnancy 2013  . Pain pelvic 2002  . Dysplasia of cervix, low grade (CIN 1)   . Monilial vaginitis 08/2006  . History of PID 03/29/2006  . Asthma     "out grew it" no inhaler    Past Surgical History  Procedure Laterality Date  . Cesarean section    . Headaches  2013     during pregnancy  . Bilateral tubal ligation    . Cesarean section  05-Aug-2012    Procedure:  CESAREAN SECTION;  Surgeon: Kirkland Hun, MD;  Location: [facility];  Service: Obstetrics;  Laterality: N/A;  . Cystoscopy  2012/08/05    Procedure: CYSTOSCOPY;  Surgeon: Kirkland Hun, MD;  Location: [facility];  Service: Obstetrics;  Laterality: N/A;    Family History  Problem Relation Age of Onset  . Cancer Maternal Grandmother   . Diabetes Paternal Grandmother   . Other Neg Hx     History  Substance Use Topics  . Smoking status: Former Games developer  . Smokeless tobacco: Never Used  . Alcohol Use: No    OB History   Grav Para Term Preterm Abortions TAB SAB Ect Mult Living   7 4 4  3  3   2       Review of Systems  Constitutional: Positive for fatigue. Negative for fever.  HENT: Positive for congestion, rhinorrhea, sneezing and sinus pressure. Negative for ear pain, sore throat, drooling, neck pain, neck stiffness and ear discharge.   Respiratory: Positive for cough. Negative for choking, chest tightness, shortness of breath and wheezing.   Cardiovascular: Negative for chest pain and palpitations.  Gastrointestinal: Negative for abdominal pain and abdominal distention.  Endocrine: Negative.   Genitourinary: Negative.  Negative for dysuria, flank pain and difficulty urinating.  Musculoskeletal: Negative for myalgias and arthralgias.  Neurological: Negative for  headaches.  Hematological: Positive for adenopathy. Does not bruise/bleed easily.  Psychiatric/Behavioral: Negative.     Allergies  Review of patient's allergies indicates no known allergies.  Home Medications   Current Outpatient Rx  Name  Route  Sig  Dispense  Refill  . docusate sodium (COLACE) 100 MG capsule   Oral   Take 1 capsule (100 mg total) by mouth 2 (two) times daily.   14 capsule   0     You may take twice per day until having regular bo ...   . Fe Fum-FePoly-FA-Vit C-Vit B3 (INTEGRA F) 125-1 MG CAPS   Oral   Take 1 capsule by mouth daily.   60 capsule   0     Take in the am w food   . ketorolac  (TORADOL) 10 MG tablet   Oral   Take 1 tablet (10 mg total) by mouth every 4 (four) hours as needed for pain (do not exceed more than 40mg  in 1 day ).   24 tablet   0   . oxyCODONE-acetaminophen (PERCOCET/ROXICET) 5-325 MG per tablet   Oral   Take 1-2 tablets by mouth every 4 (four) hours as needed (moderate - severe pain).   30 tablet   0   . oxyCODONE-acetaminophen (ROXICET) 5-325 MG per tablet   Oral   Take 1 tablet by mouth every 4 (four) hours as needed for pain.   40 tablet   0   . Prenatal Vit-Fe Fumarate-FA (MULTIVITAMIN-PRENATAL) 27-0.8 MG TABS   Oral   Take 1 tablet by mouth daily.           BP 107/81  Pulse 88  Temp(Src) 99.9 F (37.7 C) (Oral)  Resp 18  SpO2 98%  Physical Exam  Nursing note and vitals reviewed. Constitutional: She appears well-developed and well-nourished. No distress.  HENT:  Head: Normocephalic and atraumatic.  Right Ear: Tympanic membrane normal.  Left Ear: Tympanic membrane normal.  Nose: Mucosal edema and rhinorrhea present. No sinus tenderness. No epistaxis. Right sinus exhibits no maxillary sinus tenderness and no frontal sinus tenderness. Left sinus exhibits no maxillary sinus tenderness and no frontal sinus tenderness.  Mouth/Throat: Uvula is midline. Mucous membranes are not pale, not dry and not cyanotic. Posterior oropharyngeal erythema (mild) present. No oropharyngeal exudate.  Eyes: Conjunctivae are normal. Right eye exhibits no discharge. Left eye exhibits no discharge. No scleral icterus.  Neck: No JVD present. No tracheal deviation present.  Cardiovascular: Normal rate, regular rhythm, normal heart sounds and intact distal pulses.  Exam reveals no gallop and no friction rub.   No murmur heard. Pulmonary/Chest: Effort normal and breath sounds normal. No respiratory distress. She has no wheezes. She has no rales.  Abdominal: Soft. She exhibits distension (obese). There is no tenderness. There is no rebound.  Musculoskeletal:  Normal range of motion. She exhibits no edema.  Neurological: She is alert. She exhibits normal muscle tone.  Skin: Skin is warm and dry. No rash noted. She is not diaphoretic. No erythema. No pallor.  Psychiatric: She has a normal mood and affect. Her behavior is normal. Judgment and thought content normal.    ED Course  Procedures (including critical care time)  Labs Reviewed - No data to display No results found.   1. Viral URI with cough     MDM  URI. Supportive Treatment reviewed per AVS        Andrena Mews, DO 01/26/13 1110

## 2013-01-26 NOTE — ED Notes (Signed)
Per pt, head congestion, cold symptoms since Saturday, no relief with OTC meds

## 2013-01-26 NOTE — ED Provider Notes (Signed)
I saw and evaluated the patient, reviewed the resident's note and I agree with the findings and plan.   .Face to face Exam:  General:  Awake HEENT:  Atraumatic Resp:  Normal effort Abd:  Nondistended Neuro:No focal weakness Lymph: No adenopathy   Nelia Shi, MD 01/26/13 [phone]

## 2013-04-22 ENCOUNTER — Emergency Department (HOSPITAL_COMMUNITY): Payer: Self-pay

## 2013-04-22 ENCOUNTER — Emergency Department (HOSPITAL_COMMUNITY)
Admission: EM | Admit: 2013-04-22 | Discharge: 2013-04-22 | Disposition: A | Discharge status: Home / Self Care | Payer: Self-pay | Attending: Emergency Medicine | Admitting: Emergency Medicine

## 2013-04-22 ENCOUNTER — Encounter (HOSPITAL_COMMUNITY): Payer: Self-pay | Admitting: Emergency Medicine

## 2013-04-22 DIAGNOSIS — Z8619 Personal history of other infectious and parasitic diseases: Secondary | ICD-10-CM | POA: Insufficient documentation

## 2013-04-22 DIAGNOSIS — J45901 Unspecified asthma with (acute) exacerbation: Secondary | ICD-10-CM | POA: Insufficient documentation

## 2013-04-22 DIAGNOSIS — Z87440 Personal history of urinary (tract) infections: Secondary | ICD-10-CM | POA: Insufficient documentation

## 2013-04-22 DIAGNOSIS — Z8742 Personal history of other diseases of the female genital tract: Secondary | ICD-10-CM | POA: Insufficient documentation

## 2013-04-22 DIAGNOSIS — F17200 Nicotine dependence, unspecified, uncomplicated: Secondary | ICD-10-CM | POA: Insufficient documentation

## 2013-04-22 DIAGNOSIS — R079 Chest pain, unspecified: Secondary | ICD-10-CM

## 2013-04-22 DIAGNOSIS — Z87410 Personal history of cervical dysplasia: Secondary | ICD-10-CM | POA: Insufficient documentation

## 2013-04-22 DIAGNOSIS — IMO0001 Reserved for inherently not codable concepts without codable children: Secondary | ICD-10-CM | POA: Insufficient documentation

## 2013-04-22 DIAGNOSIS — R0789 Other chest pain: Secondary | ICD-10-CM | POA: Insufficient documentation

## 2013-04-22 LAB — D-DIMER, QUANTITATIVE: D-Dimer, Quant: <0.27 ug/mL-FEU (ref 0.00–0.48)

## 2013-04-22 LAB — CBC WITH DIFFERENTIAL/PLATELET
Basophils Absolute: 0 10*3/uL (ref 0.0–0.1)
Basophils Relative: 0 % (ref 0–1)
MCHC: 33.9 g/dL (ref 30.0–36.0)
Monocytes Absolute: 0.4 10*3/uL (ref 0.1–1.0)
Neutro Abs: 2.8 10*3/uL (ref 1.7–7.7)
Neutrophils Relative %: 40 % — ABNORMAL LOW (ref 43–77)
Platelets: 222 10*3/uL (ref 150–400)
RDW: 16.3 % — ABNORMAL HIGH (ref 11.5–15.5)

## 2013-04-22 LAB — COMPREHENSIVE METABOLIC PANEL
ALT: 13 U/L (ref 0–35)
Alkaline Phosphatase: 107 U/L (ref 39–117)
BUN: 14 mg/dL (ref 6–23)
CO2: 24 mEq/L (ref 19–32)
GFR calc Af Amer: >90 mL/min (ref >90)
GFR calc non Af Amer: >90 mL/min (ref >90)
Glucose, Bld: 84 mg/dL (ref 70–99)
Potassium: 4 mEq/L (ref 3.5–5.1)
Sodium: 134 mEq/L — ABNORMAL LOW (ref 135–145)
Total Bilirubin: 0.3 mg/dL (ref 0.3–1.2)

## 2013-04-22 LAB — POCT I-STAT TROPONIN I: Troponin i, poc: 0 ng/mL (ref 0.00–0.08)

## 2013-04-22 NOTE — ED Notes (Signed)
Cp x 3 days  Some sob left arm burning sharp apian comes and goes

## 2013-04-22 NOTE — ED Provider Notes (Signed)
ekg reviewed - negative D-dimer negative Temp at 98 Pt stable, feels improved and requesting d/c home   Joya Gaskins, MD 04/22/13 1718

## 2013-04-22 NOTE — ED Provider Notes (Signed)
CSN: [id number]     Arrival date & time 04/22/13  1310 History     First MD Initiated Contact with Patient 04/22/13 1417     Chief Complaint  Patient presents with  . Chest Pain   (Consider location/radiation/quality/duration/timing/severity/associated sxs/prior Treatment) HPI Pt is a 32yo female c/o 3 day hx of intermittent chest pain associated with left arm burning/sharp pain.  Chest pain is in left anterior chest, "sharp shooting pain" last a few seconds at a time, 7/10.  Pain causes pt's SOB.  Pain also causes some tingling and mild weakness in left arm.  Has not tried any pain medication.  Has never had similar pain in past.  States symptoms started 3 days ago and she thought it was just gas that would go away but started becoming more worried each day that pain did not resolve. Coworkers encouraged her to come be evaluated in ED.  Pt states she also thinks it might be due to her Implanon she had placed in Dec. 2013.   Denies cardiopulmonary hx. Denies HTN or DM.  LMP several months ago after Implanon placement.  Denies fever, cough, urinary or vaginal symptoms. Denies n/v/d.   Past Medical History  Diagnosis Date  . History of chlamydia 12/27/2000  . H/O rubella   . H/O varicella   . H/O cystitis 2002  . H/O pyelonephritis 2002  . Abnormal Pap smear 2011  . Vaginal bleeding in pregnancy 2013  . Pain pelvic 2002  . Dysplasia of cervix, low grade (CIN 1)   . Monilial vaginitis 08/2006  . History of PID 03/29/2006  . Asthma     "out grew it" no inhaler   Past Surgical History  Procedure Laterality Date  . Cesarean section    . Headaches  2013     during pregnancy  . Bilateral tubal ligation    . Cesarean section  07/02/2012    Procedure: CESAREAN SECTION;  Surgeon: Kirkland Hun, MD;  Location: [facility];  Service: Obstetrics;  Laterality: N/A;  . Cystoscopy  06/28/2012    Procedure: CYSTOSCOPY;  Surgeon: Kirkland Hun, MD;  Location: [facility];  Service: Obstetrics;   Laterality: N/A;   Family History  Problem Relation Age of Onset  . Cancer Maternal Grandmother   . Diabetes Paternal Grandmother   . Other Neg Hx    History  Substance Use Topics  . Smoking status: Current Every Day Smoker  . Smokeless tobacco: Never Used  . Alcohol Use: No   OB History   Grav Para Term Preterm Abortions TAB SAB Ect Mult Living   7 4 4  3  3   2      Review of Systems  Constitutional: Negative for fever, chills, diaphoresis, fatigue and unexpected weight change.  Respiratory: Positive for shortness of breath. Negative for cough.   Cardiovascular: Positive for chest pain. Negative for palpitations and leg swelling.  Musculoskeletal: Positive for myalgias.       Left arm   Skin: Negative for wound.  All other systems reviewed and are negative.    Allergies  Review of patient's allergies indicates no known allergies.  Home Medications  No current outpatient prescriptions on file. BP 110/70  Pulse 76  Resp 20  SpO2 97% Physical Exam  Nursing note and vitals reviewed. Constitutional: She appears well-developed and well-nourished. No distress.  Pt sitting comfortably in exam bed, NAD.     HENT:  Head: Normocephalic and atraumatic.  Eyes: Conjunctivae are normal. No scleral icterus.  Neck: Normal range of motion. Neck supple.  Cardiovascular: Normal rate, regular rhythm and normal heart sounds.   Pulmonary/Chest: Effort normal and breath sounds normal. No respiratory distress. She has no wheezes. She has no rales. She exhibits no tenderness.  Abdominal: Soft. Bowel sounds are normal. She exhibits no distension and no mass. There is no tenderness. There is no rebound and no guarding.  Musculoskeletal: Normal range of motion.  Neurological: She is alert.  Skin: Skin is warm and dry. She is not diaphoretic.    ED Course   Procedures (including critical care time)  Labs Reviewed  COMPREHENSIVE METABOLIC PANEL - Abnormal; Notable for the following:     Sodium 134 (*)    All other components within normal limits  CBC WITH DIFFERENTIAL - Abnormal; Notable for the following:    RDW 16.3 (*)    Neutrophils Relative % 40 (*)    Lymphocytes Relative 52 (*)    All other components within normal limits  D-DIMER, QUANTITATIVE  POCT I-STAT TROPONIN I   Dg Chest 2 View  04/22/2013   *RADIOLOGY REPORT*  Clinical Data: Chest pain  CHEST - 2 VIEW  Comparison:  April 02, 2011  Findings: Lungs clear.  Heart size and pulmonary vascularity are normal.  No adenopathy.  No bone lesions.  No pneumothorax.  IMPRESSION: No abnormality noted.   Original Report Authenticated By: Bretta Bang, M.D.   Date: 04/22/2013  Rate: 93  Rhythm: normal sinus rhythm  QRS Axis: indeterminate  Intervals: normal  ST/T Wave abnormalities: normal  Conduction Disutrbances:none  Narrative Interpretation: borderline EKG  Old EKG Reviewed: changes noted and rightward axis    No diagnosis found.  MDM  Pt c/o intermittent CP and SOB.  Cardiac workup performed.  Was negative.  Pt also low to moderate risk for PE due to being a smoker as well as having Implanon in place, however does not have have pleuritic chest pain and symptoms are intermittent.  O2 97%.  Will get D-dimer.  If negative, will discharge pt home and f/u with [facility].    4:36 PM D-dimer still pending.  Will sign out to Dr. Bebe Shaggy.  If negative, pt may be discharged home, f/u with [facility] to establish PCP.    Junius Finner, PA-C 04/22/13 1636

## 2013-04-23 NOTE — ED Provider Notes (Signed)
Medical screening examination/treatment/procedure(s) were conducted as a shared visit with non-physician practitioner(s) and myself.  I personally evaluated the patient during the encounter. Patient with a chest pain. Doubt cardiac cause. D-dimer is done.  Juliet Rude. Rubin Payor, MD 04/23/13 [phone]

## 2013-08-26 ENCOUNTER — Ambulatory Visit: Payer: Self-pay

## 2013-09-09 ENCOUNTER — Ambulatory Visit: Payer: Self-pay | Admitting: Internal Medicine

## 2013-09-11 ENCOUNTER — Ambulatory Visit: Payer: Self-pay | Admitting: Emergency Medicine

## 2013-09-11 ENCOUNTER — Encounter: Payer: Self-pay | Admitting: Emergency Medicine

## 2013-09-11 VITALS — BP 122/84 | HR 104 | Temp 98.0°F | Resp 18 | Ht 64.5 in | Wt 258.0 lb

## 2013-09-11 DIAGNOSIS — E669 Obesity, unspecified: Secondary | ICD-10-CM

## 2013-09-11 DIAGNOSIS — L659 Nonscarring hair loss, unspecified: Secondary | ICD-10-CM

## 2013-09-11 LAB — HEMOGLOBIN A1C
HEMOGLOBIN A1C: 5.6 % (ref <5.7)
MEAN PLASMA GLUCOSE: 114 mg/dL (ref <117)

## 2013-09-11 LAB — CBC WITH DIFFERENTIAL/PLATELET
Basophils Absolute: 0 10*3/uL (ref 0.0–0.1)
Basophils Relative: 0 % (ref 0–1)
Eosinophils Absolute: 0.1 10*3/uL (ref 0.0–0.7)
Eosinophils Relative: 1 % (ref 0–5)
HEMATOCRIT: 42.5 % (ref 36.0–46.0)
HEMOGLOBIN: 13.9 g/dL (ref 12.0–15.0)
LYMPHS ABS: 4.3 10*3/uL — AB (ref 0.7–4.0)
LYMPHS PCT: 59 % — AB (ref 12–46)
MCH: 29.2 pg (ref 26.0–34.0)
MCHC: 32.7 g/dL (ref 30.0–36.0)
MCV: 89.3 fL (ref 78.0–100.0)
MONO ABS: 0.2 10*3/uL (ref 0.1–1.0)
MONOS PCT: 3 % (ref 3–12)
NEUTROS ABS: 2.6 10*3/uL (ref 1.7–7.7)
NEUTROS PCT: 37 % — AB (ref 43–77)
Platelets: 287 10*3/uL (ref 150–400)
RBC: 4.76 MIL/uL (ref 3.87–5.11)
RDW: 15 % (ref 11.5–15.5)
WBC: 7.2 10*3/uL (ref 4.0–10.5)

## 2013-09-11 LAB — HEPATIC FUNCTION PANEL
ALT: 28 U/L (ref 0–35)
AST: 38 U/L — AB (ref 0–37)
Albumin: 4.2 g/dL (ref 3.5–5.2)
Alkaline Phosphatase: 121 U/L — ABNORMAL HIGH (ref 39–117)
BILIRUBIN DIRECT: 0.1 mg/dL (ref 0.0–0.3)
BILIRUBIN INDIRECT: 0.2 mg/dL (ref 0.0–0.9)
BILIRUBIN TOTAL: 0.3 mg/dL (ref 0.3–1.2)
Total Protein: 8.2 g/dL (ref 6.0–8.3)

## 2013-09-11 LAB — BASIC METABOLIC PANEL WITH GFR
BUN: 12 mg/dL (ref 6–23)
CO2: 20 meq/L (ref 19–32)
Calcium: 9.3 mg/dL (ref 8.4–10.5)
Chloride: 106 mEq/L (ref 96–112)
Creat: 0.77 mg/dL (ref 0.50–1.10)
GFR, Est Non African American: >89 mL/min
GLUCOSE: 93 mg/dL (ref 70–99)
POTASSIUM: 3.8 meq/L (ref 3.5–5.3)
SODIUM: 139 meq/L (ref 135–145)

## 2013-09-11 LAB — TSH: TSH: 0.769 u[IU]/mL (ref 0.350–4.500)

## 2013-09-11 MED ORDER — ALPRAZOLAM 0.5 MG PO TABS: 0.5000 mg | ORAL_TABLET | Freq: Two times a day (BID) | ORAL | Status: DC | PRN

## 2013-09-11 NOTE — Progress Notes (Signed)
   Subjective:    Patient ID: [name], female    DOB: 1981/01/08, 33 y.o.   MRN: [id number]  HPI Comments: 33 yo AAF presents to establish as new patient. She has been overweight all of her life but worse since losing her child. Phentermine over 5 years ago which worked with wt loss. She is going to start exercising and trying to improve diet.   She notes has has been under increased stress, and more anxious since losing daughter. She would like a RX for xanax to help her slow down her brain at night. She has been drinking more ETOH to help settle nerves. She notes her hair has been falling out since stress increased.     No current outpatient prescriptions on file prior to visit.   No current facility-administered medications on file prior to visit.    Review of patient's allergies indicates no known allergies. Past Medical History  Diagnosis Date  . History of chlamydia 12/27/2000  . H/O rubella   . H/O varicella   . H/O cystitis 2002  . H/O pyelonephritis 2002  . Abnormal Pap smear 2011  . Vaginal bleeding in pregnancy 2013  . Pain pelvic 2002  . Dysplasia of cervix, low grade (CIN 1)   . Monilial vaginitis 08/2006  . History of PID 03/29/2006  . Asthma     "out grew it" no inhaler  . Obese     Review of Systems  Constitutional: Positive for fatigue.  Psychiatric/Behavioral: Positive for agitation. Negative for suicidal ideas. The patient is nervous/anxious.   All other systems reviewed and are negative.   BP 122/84  Pulse 104  Temp(Src) 98 F (36.7 C) (Temporal)  Resp 18  Ht 5' 4.5" (1.638 m)  Wt 258 lb (117.028 kg)  BMI 43.62 kg/m2     Objective:   Physical Exam  Nursing note and vitals reviewed. Constitutional: She is oriented to person, place, and time. She appears well-developed and well-nourished. No distress.  OBESE  HENT:  Head: Normocephalic and atraumatic.  Right Ear: External ear normal.  Left Ear: External ear normal.  Nose: Nose normal.   Mouth/Throat: Oropharynx is clear and moist.  Eyes: Conjunctivae and EOM are normal.  Neck: Normal range of motion. Neck supple. No JVD present. No thyromegaly present.  Cardiovascular: Normal rate, regular rhythm, normal heart sounds and intact distal pulses.   Pulmonary/Chest: Effort normal and breath sounds normal.  Abdominal: Soft. Bowel sounds are normal. She exhibits no distension and no mass. There is no tenderness. There is no rebound and no guarding.  Musculoskeletal: Normal range of motion. She exhibits no edema and no tenderness.  Lymphadenopathy:    She has no cervical adenopathy.  Neurological: She is alert and oriented to person, place, and time. No cranial nerve deficit.  Skin: Skin is warm and dry. No rash noted. No erythema. No pallor.  Balding around lower 1/2 of scalp  Psychiatric: She has a normal mood and affect. Her behavior is normal. Judgment and thought content normal.      EKG WNL    Assessment & Plan:  1. New PT to establish- Obese wants to start RX wt loss medicine. Pt aware of risks. Check labs 2. Anxiety- Decrease stress, counseling advised, decrease ETOH. DO NOT take xanax and ETOH together. Xanax .5 BID/PRN, call with results. Decrease ETOH and tobacco use. Consider counseling.

## 2013-09-12 ENCOUNTER — Encounter: Payer: Self-pay | Admitting: Emergency Medicine

## 2013-09-12 LAB — VITAMIN D 25 HYDROXY (VIT D DEFICIENCY, FRACTURES): VIT D 25 HYDROXY: 15 ng/mL — AB (ref 30–89)

## 2013-09-12 LAB — INSULIN, FASTING: Insulin fasting, serum: 140 u[IU]/mL — ABNORMAL HIGH (ref 3–28)

## 2013-09-14 ENCOUNTER — Ambulatory Visit: Payer: Self-pay | Admitting: Emergency Medicine

## 2013-09-14 ENCOUNTER — Other Ambulatory Visit: Payer: Self-pay | Admitting: Emergency Medicine

## 2013-09-14 DIAGNOSIS — I10 Essential (primary) hypertension: Secondary | ICD-10-CM

## 2013-09-14 MED ORDER — METFORMIN HCL 500 MG PO TABS: 500.0000 mg | ORAL_TABLET | Freq: Two times a day (BID) | ORAL | Status: DC

## 2013-09-16 ENCOUNTER — Ambulatory Visit: Payer: Self-pay

## 2013-11-09 ENCOUNTER — Other Ambulatory Visit: Payer: Self-pay | Admitting: Emergency Medicine

## 2013-11-09 MED ORDER — METFORMIN HCL 500 MG PO TABS: 500.0000 mg | ORAL_TABLET | Freq: Two times a day (BID) | ORAL | Status: DC

## 2014-01-26 ENCOUNTER — Other Ambulatory Visit: Payer: Self-pay

## 2014-01-26 DIAGNOSIS — R7309 Other abnormal glucose: Secondary | ICD-10-CM

## 2014-01-26 MED ORDER — ALPRAZOLAM 0.5 MG PO TABS: 0.5000 mg | ORAL_TABLET | Freq: Two times a day (BID) | ORAL | Status: DC | PRN

## 2014-01-26 MED ORDER — METFORMIN HCL 500 MG PO TABS: 500.0000 mg | ORAL_TABLET | Freq: Three times a day (TID) | ORAL | Status: DC

## 2014-01-27 LAB — BASIC METABOLIC PANEL
BUN: 10 mg/dL (ref 6–23)
CO2: 25 meq/L (ref 19–32)
Calcium: 9.8 mg/dL (ref 8.4–10.5)
Chloride: 101 mEq/L (ref 96–112)
Creat: 0.76 mg/dL (ref 0.50–1.10)
Glucose, Bld: 83 mg/dL (ref 70–99)
POTASSIUM: 4.5 meq/L (ref 3.5–5.3)
SODIUM: 139 meq/L (ref 135–145)

## 2014-01-27 LAB — INSULIN, FASTING: Insulin fasting, serum: 26 u[IU]/mL (ref 3–28)

## 2014-03-26 ENCOUNTER — Other Ambulatory Visit: Payer: Self-pay | Admitting: Emergency Medicine

## 2014-03-26 MED ORDER — ALPRAZOLAM 0.5 MG PO TABS: 0.5000 mg | ORAL_TABLET | Freq: Two times a day (BID) | ORAL | Status: DC | PRN

## 2014-06-02 ENCOUNTER — Other Ambulatory Visit: Payer: Self-pay | Admitting: Emergency Medicine

## 2014-06-02 DIAGNOSIS — R7309 Other abnormal glucose: Secondary | ICD-10-CM

## 2014-06-02 MED ORDER — ALPRAZOLAM 0.5 MG PO TABS: 0.5000 mg | ORAL_TABLET | Freq: Two times a day (BID) | ORAL | Status: DC | PRN

## 2014-06-02 MED ORDER — METFORMIN HCL ER 500 MG PO TB24
500.0000 mg | ORAL_TABLET | Freq: Every day | ORAL | Status: DC
Start: 2014-06-02 — End: 2014-08-02

## 2014-07-12 ENCOUNTER — Encounter: Payer: Self-pay | Admitting: Emergency Medicine

## 2014-08-02 ENCOUNTER — Other Ambulatory Visit: Payer: Self-pay | Admitting: Emergency Medicine

## 2014-08-02 MED ORDER — ALPRAZOLAM 0.5 MG PO TABS: 0.5000 mg | ORAL_TABLET | Freq: Two times a day (BID) | ORAL | Status: DC | PRN

## 2014-08-02 MED ORDER — METFORMIN HCL ER 500 MG PO TB24: 500.0000 mg | ORAL_TABLET | Freq: Every day | ORAL | Status: DC

## 2014-12-23 ENCOUNTER — Encounter: Payer: Self-pay | Admitting: Emergency Medicine

## 2014-12-23 ENCOUNTER — Ambulatory Visit (INDEPENDENT_AMBULATORY_CARE_PROVIDER_SITE_OTHER): Payer: Self-pay | Admitting: Emergency Medicine

## 2014-12-23 VITALS — BP 118/80 | HR 102 | Temp 98.2°F | Resp 18 | Ht 64.5 in | Wt 275.0 lb

## 2014-12-23 DIAGNOSIS — R5383 Other fatigue: Secondary | ICD-10-CM

## 2014-12-23 DIAGNOSIS — R5381 Other malaise: Secondary | ICD-10-CM

## 2014-12-23 DIAGNOSIS — R635 Abnormal weight gain: Secondary | ICD-10-CM

## 2014-12-23 DIAGNOSIS — F411 Generalized anxiety disorder: Secondary | ICD-10-CM

## 2014-12-23 DIAGNOSIS — R739 Hyperglycemia, unspecified: Secondary | ICD-10-CM

## 2014-12-23 HISTORY — DX: Generalized anxiety disorder: F41.1

## 2014-12-23 LAB — CBC WITH DIFFERENTIAL/PLATELET
Basophils Absolute: 0 10*3/uL (ref 0.0–0.1)
Basophils Relative: 0 % (ref 0–1)
Eosinophils Absolute: 0.2 10*3/uL (ref 0.0–0.7)
Eosinophils Relative: 2 % (ref 0–5)
HCT: 40.7 % (ref 36.0–46.0)
HEMOGLOBIN: 13.3 g/dL (ref 12.0–15.0)
Lymphocytes Relative: 55 % — ABNORMAL HIGH (ref 12–46)
Lymphs Abs: 5.8 10*3/uL — ABNORMAL HIGH (ref 0.7–4.0)
MCH: 28.7 pg (ref 26.0–34.0)
MCHC: 32.7 g/dL (ref 30.0–36.0)
MCV: 87.7 fL (ref 78.0–100.0)
MPV: 10.7 fL (ref 8.6–12.4)
Monocytes Absolute: 0.5 10*3/uL (ref 0.1–1.0)
Monocytes Relative: 5 % (ref 3–12)
NEUTROS ABS: 4 10*3/uL (ref 1.7–7.7)
NEUTROS PCT: 38 % — AB (ref 43–77)
Platelets: 281 10*3/uL (ref 150–400)
RBC: 4.64 MIL/uL (ref 3.87–5.11)
RDW: 15.4 % (ref 11.5–15.5)
WBC: 10.5 10*3/uL (ref 4.0–10.5)

## 2014-12-23 LAB — HEMOGLOBIN A1C
Hgb A1c MFr Bld: 5.7 % — ABNORMAL HIGH (ref <5.7)
MEAN PLASMA GLUCOSE: 117 mg/dL — AB (ref <117)

## 2014-12-23 MED ORDER — ALPRAZOLAM 0.5 MG PO TABS: 0.5000 mg | ORAL_TABLET | Freq: Two times a day (BID) | ORAL | Status: DC | PRN

## 2014-12-23 MED ORDER — METFORMIN HCL ER 500 MG PO TB24: 500.0000 mg | ORAL_TABLET | Freq: Two times a day (BID) | ORAL | Status: DC

## 2014-12-23 NOTE — Patient Instructions (Signed)

## 2014-12-23 NOTE — Progress Notes (Signed)
   Subjective:    Patient ID: [name], female    DOB: December 04, 1980, 34 y.o.   MRN: [id number]  HPI Comments: 34 yo AAF needs refill on xanax and Metformin. She barely uses Xanax for stress and sleep. She usually takes 1 in evening to help slow down her brain.  She uses Metformin for weight loss. She has tried to eat better and exercise more. She has decreased ETOH. She thinks birth control is causing weight gain.    Medication List       This list is accurate as of: 12/23/14  1:35 PM.  Always use your most recent med list.               ALPRAZolam 0.5 MG tablet  Commonly known as:  XANAX  Take 1 tablet (0.5 mg total) by mouth 2 (two) times daily as needed for anxiety or sleep.     metFORMIN 500 MG 24 hr tablet  Commonly known as:  GLUCOPHAGE-XR  Take 1 tablet (500 mg total) by mouth daily with breakfast.      No Known Allergies  Past Medical History  Diagnosis Date  . History of chlamydia 12/27/2000  . H/O rubella   . H/O varicella   . H/O cystitis 2002  . H/O pyelonephritis 2002  . Abnormal Pap smear 2011  . Vaginal bleeding in pregnancy 2013  . Pain pelvic 2002  . Dysplasia of cervix, low grade (CIN 1)   . Monilial vaginitis 08/2006  . History of PID 03/29/2006  . Asthma     "out grew it" no inhaler  . Obese        Review of Systems  Constitutional: Positive for unexpected weight change.  Respiratory: Negative for shortness of breath.   Cardiovascular: Negative for chest pain.  Psychiatric/Behavioral: Negative for sleep disturbance. The patient is not nervous/anxious.   All other systems reviewed and are negative.  BP 118/80 mmHg  Pulse 102  Temp(Src) 98.2 F (36.8 C) (Temporal)  Resp 18  Ht 5' 4.5" (1.638 m)  Wt 275 lb (124.739 kg)  BMI 46.49 kg/m2     Objective:   Physical Exam  Constitutional: She is oriented to person, place, and time. She appears well-developed and well-nourished.  HENT:  Head: Normocephalic and atraumatic.  Eyes:  Conjunctivae are normal.  Neck: Normal range of motion.  Cardiovascular: Normal rate, regular rhythm, normal heart sounds and intact distal pulses.   Pulmonary/Chest: Effort normal and breath sounds normal.  Abdominal: Soft. Bowel sounds are normal. There is no tenderness.  Musculoskeletal: Normal range of motion.  Neurological: She is alert and oriented to person, place, and time.  Skin: Skin is warm and dry. No erythema.  Psychiatric: She has a normal mood and affect. Judgment normal.  Nursing note and vitals reviewed.         Assessment & Plan:  1. Anxiety/ sleep disturbance- Continue Xanax AD. w/c if SX increase or ER.   2. Elevated Blood sugar/ obesity- Check labs, refill Metformin. Advised of need for increased cardio, decrease portions of food and ETOH

## 2014-12-24 LAB — COMPREHENSIVE METABOLIC PANEL
ALK PHOS: 111 U/L (ref 39–117)
ALT: 17 U/L (ref 0–35)
AST: 22 U/L (ref 0–37)
Albumin: 4.3 g/dL (ref 3.5–5.2)
BILIRUBIN TOTAL: 0.3 mg/dL (ref 0.2–1.2)
BUN: 11 mg/dL (ref 6–23)
CO2: 26 mEq/L (ref 19–32)
CREATININE: 0.81 mg/dL (ref 0.50–1.10)
Calcium: 9.8 mg/dL (ref 8.4–10.5)
Chloride: 100 mEq/L (ref 96–112)
GLUCOSE: 80 mg/dL (ref 70–99)
Potassium: 4.7 mEq/L (ref 3.5–5.3)
Sodium: 138 mEq/L (ref 135–145)
Total Protein: 8.3 g/dL (ref 6.0–8.3)

## 2014-12-24 LAB — INSULIN, FASTING: Insulin fasting, serum: 15 u[IU]/mL (ref 2.0–19.6)

## 2014-12-24 LAB — TSH: TSH: 1.548 u[IU]/mL (ref 0.350–4.500)

## 2014-12-31 ENCOUNTER — Other Ambulatory Visit: Payer: Self-pay | Admitting: Physician Assistant

## 2014-12-31 MED ORDER — FLUTICASONE PROPIONATE 50 MCG/ACT NA SUSP: 1.0000 | Freq: Every day | NASAL | Status: DC

## 2015-01-18 ENCOUNTER — Other Ambulatory Visit: Payer: Self-pay

## 2015-01-18 DIAGNOSIS — R899 Unspecified abnormal finding in specimens from other organs, systems and tissues: Secondary | ICD-10-CM

## 2015-01-18 DIAGNOSIS — I10 Essential (primary) hypertension: Secondary | ICD-10-CM

## 2015-01-18 LAB — CBC WITH DIFFERENTIAL/PLATELET
Basophils Absolute: 0 10*3/uL (ref 0.0–0.1)
Basophils Relative: 0 % (ref 0–1)
EOS ABS: 0.2 10*3/uL (ref 0.0–0.7)
Eosinophils Relative: 3 % (ref 0–5)
HEMATOCRIT: 40.1 % (ref 36.0–46.0)
Hemoglobin: 13 g/dL (ref 12.0–15.0)
Lymphocytes Relative: 48 % — ABNORMAL HIGH (ref 12–46)
Lymphs Abs: 3.7 10*3/uL (ref 0.7–4.0)
MCH: 28.4 pg (ref 26.0–34.0)
MCHC: 32.4 g/dL (ref 30.0–36.0)
MCV: 87.7 fL (ref 78.0–100.0)
MONO ABS: 0.3 10*3/uL (ref 0.1–1.0)
MONOS PCT: 4 % (ref 3–12)
MPV: 11 fL (ref 8.6–12.4)
NEUTROS ABS: 3.5 10*3/uL (ref 1.7–7.7)
Neutrophils Relative %: 45 % (ref 43–77)
Platelets: 252 10*3/uL (ref 150–400)
RBC: 4.57 MIL/uL (ref 3.87–5.11)
RDW: 14.9 % (ref 11.5–15.5)
WBC: 7.8 10*3/uL (ref 4.0–10.5)

## 2015-01-19 ENCOUNTER — Other Ambulatory Visit: Payer: Self-pay

## 2015-04-05 ENCOUNTER — Other Ambulatory Visit: Payer: Self-pay | Admitting: Internal Medicine

## 2015-04-05 DIAGNOSIS — E119 Type 2 diabetes mellitus without complications: Secondary | ICD-10-CM

## 2015-04-05 DIAGNOSIS — F411 Generalized anxiety disorder: Secondary | ICD-10-CM

## 2015-04-05 MED ORDER — ALPRAZOLAM 1 MG PO TABS: ORAL_TABLET | ORAL | Status: AC

## 2015-04-05 MED ORDER — METFORMIN HCL ER 500 MG PO TB24: 500.0000 mg | ORAL_TABLET | Freq: Two times a day (BID) | ORAL | Status: DC

## 2015-07-15 ENCOUNTER — Other Ambulatory Visit: Payer: Self-pay | Admitting: Internal Medicine

## 2015-07-15 DIAGNOSIS — G4700 Insomnia, unspecified: Secondary | ICD-10-CM

## 2015-08-11 ENCOUNTER — Other Ambulatory Visit: Payer: Self-pay | Admitting: Internal Medicine

## 2015-08-11 DIAGNOSIS — Z30019 Encounter for initial prescription of contraceptives, unspecified: Secondary | ICD-10-CM

## 2015-08-11 MED ORDER — NORGESTREL-ETHINYL ESTRADIOL 0.3-30 MG-MCG PO TABS: 1.0000 | ORAL_TABLET | Freq: Every day | ORAL | Status: DC

## 2015-08-12 ENCOUNTER — Ambulatory Visit: Payer: Self-pay | Admitting: Internal Medicine

## 2015-08-15 ENCOUNTER — Encounter: Payer: Self-pay | Admitting: Internal Medicine

## 2015-08-15 ENCOUNTER — Ambulatory Visit: Payer: Self-pay | Admitting: Internal Medicine

## 2015-08-15 VITALS — BP 128/86 | HR 56 | Temp 97.0°F | Resp 16

## 2015-08-15 DIAGNOSIS — S40852A Superficial foreign body of left upper arm, initial encounter: Secondary | ICD-10-CM

## 2015-08-15 NOTE — Progress Notes (Signed)
  Subjective:    Patient ID: [name], female    DOB: 11-14-1980, 34 y.o.   MRN: [id number]  HPI  Patient presents for removal of an Implanon BC device of the L arm which expires in 2 weeks and also desires removal for concern about hair loss.  Patient is rx'd Lo-Ovral and advised to use alternate BC for at least 1 menstrual cycle.   Medication Sig  . ALPRAZolam  1 MG tablet TAKE 1/2 OR 1 TABLET TWO TIMES A DAY IF NEEDED FOR ANXIETY OR SLEEP  . FLONASE nasal spray Place 1 spray into both nostrils at bedtime.  . norgestrel-ee (LO/OVRAL) 0.3-30 MG-MCG Take 1 tablet by mouth daily.  . metFORMIN -XR) 500 MG 24 hr tablet Take 1 tablet (500 mg total) by mouth 2 (two) times daily.   Review of Systems 10 point systems review negative except as above.    Objective:   Physical Exam BP 128/86 mmHg  Pulse 56  Temp(Src) 97 F (36.1 C)  Resp 16  After informed consent and aseptic prep with alcohol the FB was identified in the subcut of the inner L forearm and anesthetized with 2 ml of marcaine 0.5% w/epi. Then with a #10 scalpel a 1/8-1/4 " incision was made at the distal end & the Implanon cylinder was identified and secured with a mosquito forceps & extracted w/o difficulty.  Then a vertical mattress suture was applied to close the wound and sterile dsg applied.     Assessment & Plan:   1. Foreign body of skin of upper arm, left, initial encounter   Wound care advised and to remove suture in 1 week.

## 2015-08-16 ENCOUNTER — Other Ambulatory Visit: Payer: Self-pay | Admitting: Physician Assistant

## 2015-08-16 ENCOUNTER — Other Ambulatory Visit: Payer: Self-pay

## 2015-08-16 DIAGNOSIS — E559 Vitamin D deficiency, unspecified: Secondary | ICD-10-CM

## 2015-08-16 DIAGNOSIS — R7303 Prediabetes: Secondary | ICD-10-CM

## 2015-08-16 DIAGNOSIS — Z79899 Other long term (current) drug therapy: Secondary | ICD-10-CM

## 2015-08-16 DIAGNOSIS — R5383 Other fatigue: Secondary | ICD-10-CM

## 2015-08-16 DIAGNOSIS — Z13220 Encounter for screening for lipoid disorders: Secondary | ICD-10-CM

## 2015-08-16 LAB — CBC WITH DIFFERENTIAL/PLATELET
BASOS PCT: 0 % (ref 0–1)
Basophils Absolute: 0 10*3/uL (ref 0.0–0.1)
EOS ABS: 0.2 10*3/uL (ref 0.0–0.7)
EOS PCT: 2 % (ref 0–5)
HCT: 39.1 % (ref 36.0–46.0)
Hemoglobin: 12.9 g/dL (ref 12.0–15.0)
Lymphocytes Relative: 41 % (ref 12–46)
Lymphs Abs: 3.9 10*3/uL (ref 0.7–4.0)
MCH: 28.4 pg (ref 26.0–34.0)
MCHC: 33 g/dL (ref 30.0–36.0)
MCV: 86.1 fL (ref 78.0–100.0)
MONO ABS: 0.3 10*3/uL (ref 0.1–1.0)
MONOS PCT: 3 % (ref 3–12)
MPV: 11.2 fL (ref 8.6–12.4)
Neutro Abs: 5.1 10*3/uL (ref 1.7–7.7)
Neutrophils Relative %: 54 % (ref 43–77)
PLATELETS: 274 10*3/uL (ref 150–400)
RBC: 4.54 MIL/uL (ref 3.87–5.11)
RDW: 15.6 % — AB (ref 11.5–15.5)
WBC: 9.5 10*3/uL (ref 4.0–10.5)

## 2015-08-16 LAB — HEMOGLOBIN A1C
HEMOGLOBIN A1C: 5.5 % (ref <5.7)
MEAN PLASMA GLUCOSE: 111 mg/dL (ref <117)

## 2015-08-17 ENCOUNTER — Other Ambulatory Visit: Payer: Self-pay | Admitting: Internal Medicine

## 2015-08-17 ENCOUNTER — Other Ambulatory Visit: Payer: Self-pay | Admitting: Physician Assistant

## 2015-08-17 DIAGNOSIS — E785 Hyperlipidemia, unspecified: Secondary | ICD-10-CM | POA: Insufficient documentation

## 2015-08-17 LAB — HEPATIC FUNCTION PANEL
ALT: 10 U/L (ref 6–29)
AST: 13 U/L (ref 10–30)
Albumin: 4.1 g/dL (ref 3.6–5.1)
Alkaline Phosphatase: 90 U/L (ref 33–115)
BILIRUBIN DIRECT: 0.1 mg/dL (ref <=0.2)
BILIRUBIN TOTAL: 0.4 mg/dL (ref 0.2–1.2)
Indirect Bilirubin: 0.3 mg/dL (ref 0.2–1.2)
Total Protein: 8.1 g/dL (ref 6.1–8.1)

## 2015-08-17 LAB — LIPID PANEL
CHOL/HDL RATIO: 5.4 ratio — AB (ref <=5.0)
CHOLESTEROL: 233 mg/dL — AB (ref 125–200)
HDL: 43 mg/dL — ABNORMAL LOW (ref >=46)
LDL Cholesterol: 175 mg/dL — ABNORMAL HIGH (ref <130)
Triglycerides: 76 mg/dL (ref <150)
VLDL: 15 mg/dL (ref <30)

## 2015-08-17 LAB — BASIC METABOLIC PANEL WITH GFR
BUN: 13 mg/dL (ref 7–25)
CALCIUM: 9.4 mg/dL (ref 8.6–10.2)
CO2: 26 mmol/L (ref 20–31)
CREATININE: 0.73 mg/dL (ref 0.50–1.10)
Chloride: 102 mmol/L (ref 98–110)
GFR, Est Non African American: >89 mL/min (ref >=60)
Glucose, Bld: 83 mg/dL (ref 65–99)
Potassium: 4.3 mmol/L (ref 3.5–5.3)
SODIUM: 136 mmol/L (ref 135–146)

## 2015-08-17 LAB — INSULIN, FASTING: INSULIN FASTING, SERUM: 21 u[IU]/mL — AB (ref 2.0–19.6)

## 2015-08-17 LAB — MAGNESIUM: MAGNESIUM: 1.9 mg/dL (ref 1.5–2.5)

## 2015-08-17 LAB — VITAMIN D 25 HYDROXY (VIT D DEFICIENCY, FRACTURES): VIT D 25 HYDROXY: 31 ng/mL (ref 30–100)

## 2015-08-17 LAB — TSH: TSH: 1.078 u[IU]/mL (ref 0.350–4.500)

## 2015-08-17 MED ORDER — LOVASTATIN 20 MG PO TABS: 20.0000 mg | ORAL_TABLET | Freq: Every day | ORAL | Status: DC

## 2015-09-26 ENCOUNTER — Other Ambulatory Visit: Payer: Self-pay | Admitting: Internal Medicine

## 2015-09-26 DIAGNOSIS — G4700 Insomnia, unspecified: Secondary | ICD-10-CM

## 2015-09-26 MED ORDER — ALPRAZOLAM 1 MG PO TABS: ORAL_TABLET | ORAL | Status: DC

## 2015-10-14 ENCOUNTER — Other Ambulatory Visit: Payer: Self-pay | Admitting: Internal Medicine

## 2016-05-31 ENCOUNTER — Other Ambulatory Visit: Payer: Self-pay | Admitting: Internal Medicine

## 2016-05-31 MED ORDER — PHENTERMINE HCL 37.5 MG PO TABS: 37.5000 mg | ORAL_TABLET | Freq: Every day | ORAL | 2 refills | Status: DC

## 2016-05-31 NOTE — Progress Notes (Signed)
Patient wants to lose weight.  She reports that she is making lifestyle changes and is still not having significant weight loss.  She reports that she would like to try phentermine.

## 2016-08-12 ENCOUNTER — Other Ambulatory Visit: Payer: Self-pay | Admitting: Internal Medicine

## 2016-08-12 DIAGNOSIS — Z30019 Encounter for initial prescription of contraceptives, unspecified: Secondary | ICD-10-CM

## 2016-08-31 ENCOUNTER — Other Ambulatory Visit: Payer: Self-pay | Admitting: Internal Medicine

## 2016-08-31 MED ORDER — PHENTERMINE HCL 37.5 MG PO TABS: 37.5000 mg | ORAL_TABLET | Freq: Every day | ORAL | 2 refills | Status: DC

## 2016-10-05 ENCOUNTER — Other Ambulatory Visit: Payer: Self-pay | Admitting: Internal Medicine

## 2016-10-05 MED ORDER — PREDNISONE 20 MG PO TABS: ORAL_TABLET | ORAL | 0 refills | Status: DC

## 2016-10-05 MED ORDER — PROMETHAZINE-DM 6.25-15 MG/5ML PO SYRP: ORAL_SOLUTION | ORAL | 1 refills | Status: DC

## 2016-11-18 ENCOUNTER — Other Ambulatory Visit: Payer: Self-pay | Admitting: Internal Medicine

## 2016-11-18 DIAGNOSIS — Z30019 Encounter for initial prescription of contraceptives, unspecified: Secondary | ICD-10-CM

## 2016-12-21 ENCOUNTER — Other Ambulatory Visit: Payer: Self-pay | Admitting: Internal Medicine

## 2016-12-21 MED ORDER — PHENTERMINE HCL 37.5 MG PO TABS: 37.5000 mg | ORAL_TABLET | Freq: Every day | ORAL | 2 refills | Status: DC

## 2017-01-01 ENCOUNTER — Other Ambulatory Visit: Payer: Self-pay | Admitting: Physician Assistant

## 2017-01-01 MED ORDER — LEVOCETIRIZINE DIHYDROCHLORIDE 5 MG PO TABS: 5.0000 mg | ORAL_TABLET | Freq: Every evening | ORAL | 3 refills | Status: DC

## 2017-03-26 ENCOUNTER — Encounter (HOSPITAL_COMMUNITY): Payer: Self-pay | Admitting: Emergency Medicine

## 2017-03-26 ENCOUNTER — Emergency Department (HOSPITAL_COMMUNITY)
Admission: EM | Admit: 2017-03-26 | Discharge: 2017-03-26 | Disposition: A | Discharge status: Home / Self Care | Payer: No Typology Code available for payment source | Attending: Emergency Medicine | Admitting: Emergency Medicine

## 2017-03-26 DIAGNOSIS — Z79899 Other long term (current) drug therapy: Secondary | ICD-10-CM | POA: Diagnosis not present

## 2017-03-26 DIAGNOSIS — J45909 Unspecified asthma, uncomplicated: Secondary | ICD-10-CM | POA: Insufficient documentation

## 2017-03-26 DIAGNOSIS — H5712 Ocular pain, left eye: Secondary | ICD-10-CM | POA: Diagnosis present

## 2017-03-26 DIAGNOSIS — F17200 Nicotine dependence, unspecified, uncomplicated: Secondary | ICD-10-CM | POA: Diagnosis not present

## 2017-03-26 MED ORDER — FLUORESCEIN SODIUM 0.6 MG OP STRP
1.0000 | ORAL_STRIP | Freq: Once | OPHTHALMIC | Status: AC
  Administered 2017-03-26: 1 via OPHTHALMIC

## 2017-03-26 MED ORDER — TETRACAINE HCL 0.5 % OP SOLN
2.0000 [drp] | Freq: Once | OPHTHALMIC | Status: AC
  Administered 2017-03-26: 2 [drp] via OPHTHALMIC
  Filled 2017-03-26: qty 4

## 2017-03-26 NOTE — ED Notes (Signed)
Pt states she woke up on Sat.with an irritated left eye that worsened on Sun. And went to urgent care and was placed on polymyxin  Sulf/TMP and pt states that it has not help and eye feels worsen and throbbing, light sensitivity.  Left eye  Appears red.

## 2017-03-26 NOTE — ED Provider Notes (Signed)
[facility]-EMERGENCY DEPT Provider Note   CSN: [id number] Arrival date & time: 03/26/17  1519   By signing my name below, I, Clarisse GougeXavier Herndon, attest that this documentation has been prepared under the direction and in the presence of Shawn Joy, PA-C. Electronically signed, Clarisse GougeXavier Herndon, ED Scribe. 03/26/17. 6:50 PM.  History   Chief Complaint Chief Complaint  Patient presents with  . Eye Pain   The history is provided by the patient and medical records. No language interpreter was used.    [name] is a 36 y.o. female presenting to the Emergency Department Complaining of left eye pain worsening over the last 4 days. She endorses photophobia and pain with eye movement. Patient states she woke up with this pain. Has never experienced this before. Pain is constant, described as throbbing, aching, and sharp. Pain is moderate while in the dark, but 10 out of 10 when exposed to light. The pain feels like it is in the front and top of the eye. Denies feelings of pain behind the eye. She endorses minor, yellow crusting to the eye in the mornings. Patient was seen in urgent care 2 days ago, diagnosed with conjunctivitis, and prescribed Polytrim. Patient states she has been using this medication as prescribed without improvement. She has also been applying warm, damp compresses without relief. Patient is a Water quality scientistphlebotomist. Denies splash injury or chemical exposure.   Denies contact lens use, diabetes, or history of STDs.  Denies fever/chills, nausea/vomiting, dizziness, visual deficits or abnormalities, eye itching, or any other complaints.  Past Medical History:  Diagnosis Date  . Abnormal Pap smear 2011  . Asthma    "out grew it" no inhaler  . Dysplasia of cervix, low grade (CIN 1)   . Generalized anxiety disorder 12/23/2014  . H/O cystitis 2002  . H/O pyelonephritis 2002  . H/O rubella   . H/O varicella   . History of chlamydia 12/27/2000  . History of PID 03/29/2006  . Monilial vaginitis  08/2006  . Obese   . Pain pelvic 2002  . Vaginal bleeding in pregnancy 2013    Patient Active Problem List   Diagnosis Date Noted  . Hyperlipidemia 08/17/2015  . Generalized anxiety disorder 12/23/2014  . Obese   . Viral URI with cough 01/26/2013  . Status post repeat low transverse cesarean section 07/10/2012  . History of induced abortion 03/23/2012  . Unexpected sudden death of infant 03/13/2012  . History of cesarean delivery 03/24/2012  . Asthma 03/27/2012  . Uterine size date discrepancy pregnancy 01/23/2012    Past Surgical History:  Procedure Laterality Date  . bilateral tubal ligation    . CESAREAN SECTION    . CESAREAN SECTION  09-30-11   Procedure: CESAREAN SECTION;  Surgeon: Kirkland HunArthur Stringer, MD;  Location: [facility];  Service: Obstetrics;  Laterality: N/A;  . CYSTOSCOPY  09-30-11   Procedure: CYSTOSCOPY;  Surgeon: Kirkland HunArthur Stringer, MD;  Location: [facility];  Service: Obstetrics;  Laterality: N/A;  . headaches  2013    during pregnancy    OB History    Gravida Para Term Preterm AB Living   7 4 4   3 2    SAB TAB Ectopic Multiple Live Births   3       3       Home Medications    Prior to Admission medications   Medication Sig Start Date End Date Taking? Authorizing Provider  ALPRAZolam Prudy Feeler(XANAX) 1 MG tablet Take 1/2 to 1 tablet 2 or 3 x day  if needed for nerves 09/26/15   Lucky Cowboy, MD  CRYSELLE-28 0.3-30 MG-MCG tablet TAKE ONE TABLET BY MOUTH ONCE DAILY 11/18/16   Lucky Cowboy, MD  fluticasone [facility]) 50 MCG/ACT nasal spray Place 1 spray into both nostrils at bedtime. 12/31/14   Quentin Mulling, PA-C  levocetirizine (XYZAL) 5 MG tablet Take 1 tablet (5 mg total) by mouth every evening. 01/01/17   Quentin Mulling, PA-C  lovastatin (MEVACOR) 20 MG tablet Take 1 tablet (20 mg total) by mouth at bedtime. 08/17/15   Quentin Mulling, PA-C  metFORMIN (GLUCOPHAGE-XR) 500 MG 24 hr tablet TAKE 1 TABLET BY MOUTH TWICE DAILY 10/14/15   Lucky Cowboy, MD    phentermine (ADIPEX-P) 37.5 MG tablet Take 1 tablet (37.5 mg total) by mouth daily before breakfast. 12/21/16   Forcucci, Toni Amend, PA-C  predniSONE (DELTASONE) 20 MG tablet 3 tabs po daily x 3 days, then 2 tabs x 3 days, then 1.5 tabs x 3 days, then 1 tab x 3 days, then 0.5 tabs x 3 days 10/05/16   Terri Piedra, PA-C  promethazine-dextromethorphan (PROMETHAZINE-DM) 6.25-15 MG/5ML syrup Take 5-10 ML PO q8hrs prn for cough 10/05/16   Terri Piedra, PA-C    Family History Family History  Problem Relation Age of Onset  . Cancer Maternal Grandmother        cervical  . Diabetes Paternal Grandmother   . Heart disease Maternal Grandfather   . Hyperlipidemia Maternal Grandfather   . Hypertension Maternal Grandfather   . Other Neg Hx     Social History Social History  Substance Use Topics  . Smoking status: Current Some Day Smoker  . Smokeless tobacco: Never Used  . Alcohol use 0.0 oz/week     Allergies   Patient has no known allergies.   Review of Systems Review of Systems  Constitutional: Negative for chills and fever.  Eyes: Positive for photophobia and pain. Negative for redness, itching and visual disturbance.  Gastrointestinal: Negative for nausea and vomiting.  Neurological: Negative for dizziness, syncope, weakness, light-headedness, numbness and headaches.  All other systems reviewed and are negative.    Physical Exam Updated Vital Signs BP (!) 139/98 (BP Location: Left Arm)   Pulse 60   Temp 98.5 F (36.9 C) (Oral)   Resp 17   Ht 5\' 4"  (1.626 m)   Wt 239 lb (108.4 kg)   LMP 03/04/2017   SpO2 100%   BMI 41.02 kg/m   Physical Exam  Constitutional: She appears well-developed and well-nourished. No distress.  HENT:  Head: Normocephalic and atraumatic.  Mouth/Throat: Oropharynx is clear and moist.  Eyes: Pupils are equal, round, and reactive to light. Conjunctivae are normal.  No scleral or conjunctival injection. Avoidant photophobia. Pain with EOMs  as well as both pupillary and consensual pupillary constriction. Patient's pain was not relieved with tetracaine. Pupillary constriction and dilation appears to occur smoothly and equally bilaterally.  Slit lamp exam was also performed with increased fluorescein uptake in a jagged ring along the edge of the cornea. No contact lenses in place. No noted foreign bodies. No noted dendritic lesions. Cells and flare noted in anterior chamber No noted angle changes when compared to opposite eye. Tono-Pen values: Right eye: 17  Left eye: 13    Visual Acuity  Right Eye Distance: 20/15 Left Eye Distance: 20/25 Bilateral Distance: 20/15  Right Eye Near:   Left Eye Near:    Bilateral Near:      Neck: Normal range of motion. Neck supple.  Cardiovascular: Normal rate,  regular rhythm, normal heart sounds and intact distal pulses.   Pulmonary/Chest: Effort normal and breath sounds normal. No respiratory distress.  Abdominal: There is no guarding.  Musculoskeletal: She exhibits no edema.  Lymphadenopathy:    She has no cervical adenopathy.  Neurological: She is alert.  No sensory deficits. Strength 5/5 in all extremities. No gait disturbance. Coordination intact including heel to shin and finger to nose. Cranial nerves III-XII grossly intact. No facial droop.   Skin: Skin is warm and dry. She is not diaphoretic.  Psychiatric: She has a normal mood and affect. Her behavior is normal.  Nursing note and vitals reviewed.    ED Treatments / Results  DIAGNOSTIC STUDIES: Oxygen Saturation is 100% on RA, NL by my interpretation.    COORDINATION OF CARE: 6:46 PM-Discussed next steps with pt. Pt verbalized understanding and is agreeable with the plan. Pt prepared for eye exam.   Labs (all labs ordered are listed, but only abnormal results are displayed) Labs Reviewed - No data to display  EKG  EKG Interpretation None       Radiology No results found.  Procedures Procedures (including  critical care time)  Medications Ordered in ED Medications  fluorescein ophthalmic strip 1 strip (1 strip Left Eye Given 03/26/17 1817)  tetracaine (PONTOCAINE) 0.5 % ophthalmic solution 2 drop (2 drops Both Eyes Given 03/26/17 1817)     Initial Impression / Assessment and Plan / ED Course  I have reviewed the triage vital signs and the nursing notes.  Pertinent labs & imaging results that were available during my care of the patient were reviewed by me and considered in my medical decision making (see chart for details).  Clinical Course as of Mar 26 2002  Tue Mar 26, 2017  1945 Spoke with Fabian Sharp, Ophthalmologist, who recommends that the patient come to the clinic for evaluation between 8-9AM tomorrow. Can stop the Polytrim. Cool compresses tonight for comfort. No additional instructions.  [SJ]    Clinical Course User Index [SJ] Joy, Shawn C, PA-C     Patient presents with a painful eye. Symptoms suggest possible uveitis. Ophthalmology follow-up tomorrow morning. Return precautions discussed. Patient voices understanding of all instructions and is comfortable with discharge.    Findings and plan of care discussed with Shaune Pollack, MD.   Final Clinical Impressions(s) / ED Diagnoses   Final diagnoses:  Left eye pain    New Prescriptions New Prescriptions   No medications on file  I personally performed the services described in this documentation, which was scribed in my presence. The recorded information has been reviewed and is accurate.    Concepcion Living 03/26/17 Danny Lawless, MD 03/27/17 1141

## 2017-03-26 NOTE — Discharge Instructions (Signed)
Discontinue the Polytrim. May apply cool compresses for comfort. Go to the ophthalmologist office tomorrow morning at 8 AM. Return to the ED should you begin to have worsening symptoms, especially vision loss.

## 2017-03-26 NOTE — ED Triage Notes (Signed)
Patient states that she started having little redness on Saturday with pain when moving eye. patient seen at [facility] on Sunday and started on eye drops.  Patient reports still painful and red.

## 2017-04-04 ENCOUNTER — Other Ambulatory Visit: Payer: Self-pay | Admitting: Internal Medicine

## 2017-04-04 MED ORDER — NEOMYCIN-POLYMYXIN-DEXAMETH 0.1 % OP SUSP: OPHTHALMIC | 1 refills | Status: AC

## 2017-05-23 ENCOUNTER — Encounter: Payer: Self-pay | Admitting: Physician Assistant

## 2017-05-23 NOTE — Progress Notes (Signed)
Complete Physical  Assessment and Plan: Uncomplicated asthma, unspecified asthma severity, unspecified whether persistent Better with weight loss  Generalized anxiety disorder -     TSH - start new medication prescribed, stress management techniques discussed, increase water, good sleep hygiene discussed, increase exercise, and increase veggies.  -     amitriptyline (ELAVIL) 25 MG tablet; 1/2-1 tablet as needed  Hyperlipidemia, unspecified hyperlipidemia type -     TSH -     Lipid panel -continue medications, check lipids, decrease fatty foods, increase activity.   Routine general medical examination at a health care facility  Medication management -     CBC with Differential/Platelet -     BASIC METABOLIC PANEL WITH GFR -     Hepatic function panel -     Magnesium  Vitamin D deficiency -     VITAMIN D 25 Hydroxy (Vit-D Deficiency, Fractures)  Screening, anemia, deficiency, iron -     Iron,Total/Total Iron Binding Cap -     Vitamin B12  Screening for hematuria or proteinuria -     Urinalysis w microscopic + reflex cultur -     Microalbumin / creatinine urine ratio  Class 2 obesity due to excess calories without serious comorbidity with body mass index (BMI) of 37.0 to 37.9 in adult Morbid Obesity with co morbidities - long discussion about weight loss, diet, and exercise   Discussed med's effects and SE's. Screening labs and tests as requested with regular follow-up as recommended. Over 40 minutes of exam, counseling, chart review, and complex, high level critical decision making was performed this visit.   HPI  36 y.o. female  presents for a complete physical and follow up for has Asthma; Obese; Generalized anxiety disorder; and Hyperlipidemia on her problem list..  Her blood pressure has been controlled at home, today their BP is BP: 118/66 She does workout, goes to [employer] with cousin that is Systems analystpersonal trainer. She denies chest pain, shortness of breath,  dizziness.  She is on bASA every other day.  She is not on cholesterol medication, was on lovastatin. Her cholesterol is not at goal. The cholesterol last visit was:   Lab Results  Component Value Date   CHOL 233 (H) 08/16/2015   HDL 43 (L) 08/16/2015   LDLCALC 175 (H) 08/16/2015   TRIG 76 08/16/2015   CHOLHDL 5.4 (H) 08/16/2015    Last A1C in the office was:  Lab Results  Component Value Date   HGBA1C 5.5 08/16/2015   Patient is on Vitamin D supplement, 10,000 daily.   Lab Results  Component Value Date   VD25OH 31 08/16/2015     BMI is Body mass index is 37.18 kg/m., she is working on diet and exercise. Has been off phentermine for at least a month.  Wt Readings from Last 3 Encounters:  05/27/17 220 lb (99.8 kg)  03/26/17 239 lb (108.4 kg)  12/23/14 275 lb (124.7 kg)    Current Medications:  Current Outpatient Prescriptions on File Prior to Visit  Medication Sig Dispense Refill  . ALPRAZolam (XANAX) 1 MG tablet Take 1/2 to 1 tablet 2 or 3 x day if needed for nerves 90 tablet 2  . CRYSELLE-28 0.3-30 MG-MCG tablet TAKE ONE TABLET BY MOUTH ONCE DAILY 3 Package 3  . fluticasone (FLONASE) 50 MCG/ACT nasal spray Place 1 spray into both nostrils at bedtime. 16 g 2  . levocetirizine (XYZAL) 5 MG tablet Take 1 tablet (5 mg total) by mouth every evening. 30 tablet 3  .  lovastatin (MEVACOR) 20 MG tablet Take 1 tablet (20 mg total) by mouth at bedtime. 30 tablet 3  . phentermine (ADIPEX-P) 37.5 MG tablet Take 1 tablet (37.5 mg total) by mouth daily before breakfast. 30 tablet 2   No current facility-administered medications on file prior to visit.    Allergies:  No Known Allergies Medical History:  She has Asthma; Obese; Generalized anxiety disorder; and Hyperlipidemia on her problem list. Health Maintenance:   Immunization History  Administered Date(s) Administered  . Influenza Split 06/18/2012  . Tdap 07/10/2012   Tetanus: 2013 Pneumovax: Prevnar 13:  Flu vaccine:  declines Zostavax:  Patient's last menstrual period was 05/09/2017. Pap: 2013 never abnormal  MGM:N/A DEXA: Colonoscopy: due 45 EGD:  Patient Care Team: Lucky Cowboy, MD as PCP - General (Internal Medicine)  Surgical History:  She has a past surgical history that includes Cesarean section; headaches (2013); bilateral tubal ligation; Cesarean section (06/29/2012); and Cystoscopy (07/08/2012). Family History:  Herfamily history includes Cancer in her maternal grandmother; Diabetes in her paternal grandmother; Heart disease in her maternal grandfather; Hyperlipidemia in her maternal grandfather; Hypertension in her maternal grandfather. Social History:  She reports that she has been smoking.  She has never used smokeless tobacco. She reports that she drinks alcohol. She reports that she uses drugs, including Marijuana.  Review of Systems: Review of Systems  Constitutional: Negative.   HENT: Negative.   Eyes: Negative.   Respiratory: Negative.   Cardiovascular: Negative.   Gastrointestinal: Negative.   Genitourinary: Negative.   Musculoskeletal: Negative.   Skin: Negative.   Neurological: Negative.   Endo/Heme/Allergies: Negative.   Psychiatric/Behavioral: Negative for depression, hallucinations, memory loss, substance abuse and suicidal ideas. The patient is nervous/anxious and has insomnia.     Physical Exam: Estimated body mass index is 37.18 kg/m as calculated from the following:   Height as of this encounter: 5' 4.5" (1.638 m).   Weight as of this encounter: 220 lb (99.8 kg). BP 118/66   Pulse (!) 101   Temp 97.6 F (36.4 C)   Resp 18   Ht 5' 4.5" (1.638 m)   Wt 220 lb (99.8 kg)   LMP 05/09/2017   SpO2 97%   BMI 37.18 kg/m  General Appearance: Well nourished, in no apparent distress.  Eyes: PERRLA, EOMs, conjunctiva no swelling or erythema, normal fundi and vessels.  Sinuses: No Frontal/maxillary tenderness  ENT/Mouth: Ext aud canals clear, normal light  reflex with TMs without erythema, bulging. Good dentition. No erythema, swelling, or exudate on post pharynx. Tonsils not swollen or erythematous. Hearing normal.  Neck: Supple, thyroid normal. No bruits  Respiratory: Respiratory effort normal, BS equal bilaterally without rales, rhonchi, wheezing or stridor.  Cardio: RRR without murmurs, rubs or gallops. Brisk peripheral pulses without edema.  Chest: symmetric, with normal excursions and percussion.  Breasts: defer Abdomen: Soft, nontender, no guarding, rebound, hernias, masses, or organomegaly.  Lymphatics: Non tender without lymphadenopathy.  Genitourinary: defer Musculoskeletal: Full ROM all peripheral extremities,5/5 strength, and normal gait.  Skin: Warm, dry without rashes, lesions, ecchymosis. Neuro: Cranial nerves intact, reflexes equal bilaterally. Normal muscle tone, no cerebellar symptoms. Sensation intact.  Psych: Awake and oriented X 3, normal affect, Insight and Judgment appropriate.    Quentin Mulling 10:06 AM [facility]

## 2017-05-27 ENCOUNTER — Ambulatory Visit (INDEPENDENT_AMBULATORY_CARE_PROVIDER_SITE_OTHER): Payer: PRIVATE HEALTH INSURANCE | Admitting: Physician Assistant

## 2017-05-27 ENCOUNTER — Encounter: Payer: Self-pay | Admitting: Physician Assistant

## 2017-05-27 VITALS — BP 118/66 | HR 101 | Temp 97.6°F | Resp 18 | Ht 64.5 in | Wt 220.0 lb

## 2017-05-27 DIAGNOSIS — R5383 Other fatigue: Secondary | ICD-10-CM | POA: Diagnosis not present

## 2017-05-27 DIAGNOSIS — J45909 Unspecified asthma, uncomplicated: Secondary | ICD-10-CM

## 2017-05-27 DIAGNOSIS — E559 Vitamin D deficiency, unspecified: Secondary | ICD-10-CM

## 2017-05-27 DIAGNOSIS — Z79899 Other long term (current) drug therapy: Secondary | ICD-10-CM

## 2017-05-27 DIAGNOSIS — I10 Essential (primary) hypertension: Secondary | ICD-10-CM | POA: Diagnosis not present

## 2017-05-27 DIAGNOSIS — E6609 Other obesity due to excess calories: Secondary | ICD-10-CM

## 2017-05-27 DIAGNOSIS — E785 Hyperlipidemia, unspecified: Secondary | ICD-10-CM

## 2017-05-27 DIAGNOSIS — R739 Hyperglycemia, unspecified: Secondary | ICD-10-CM

## 2017-05-27 DIAGNOSIS — Z0000 Encounter for general adult medical examination without abnormal findings: Secondary | ICD-10-CM

## 2017-05-27 DIAGNOSIS — Z130 Encounter for screening for diseases of the blood and blood-forming organs and certain disorders involving the immune mechanism: Secondary | ICD-10-CM

## 2017-05-27 DIAGNOSIS — F411 Generalized anxiety disorder: Secondary | ICD-10-CM

## 2017-05-27 DIAGNOSIS — Z6837 Body mass index (BMI) 37.0-37.9, adult: Secondary | ICD-10-CM

## 2017-05-27 DIAGNOSIS — Z1389 Encounter for screening for other disorder: Secondary | ICD-10-CM

## 2017-05-27 MED ORDER — AMITRIPTYLINE HCL 25 MG PO TABS: ORAL_TABLET | ORAL | 2 refills | Status: DC

## 2017-05-27 NOTE — Patient Instructions (Addendum)
OVER DUE FOR PAP SMEAR NEED TO GET HERE OR AT GYN  11 Tips to Follow:  1. No caffeine after 3pm: Avoid beverages with caffeine (soda, tea, energy drinks, etc.) especially after 3pm. 2. Don't go to bed hungry: Have your evening meal at least 3 hrs. before going to sleep. It's fine to have a small bedtime snack such as a glass of milk and a few crackers but don't have a big meal. 3. Have a nightly routine before bed: Plan on "winding down" before you go to sleep. Begin relaxing about 1 hour before you go to bed. Try doing a quiet activity such as listening to calming music, reading a book or meditating. 4. Turn off the TV and ALL electronics including video games, tablets, laptops, etc. 1 hour before sleep, and keep them out of the bedroom. 5. Turn off your cell phone and all notifications (new email and text alerts) or even better, leave your phone outside your room while you sleep. Studies have shown that a part of your brain continues to respond to certain lights and sounds even while you're still asleep. 6. Make your bedroom quiet, dark and cool. If you can't control the noise, try wearing earplugs or using a fan to block out other sounds. 7. Practice relaxation techniques. Try reading a book or meditating or drain your brain by writing a list of what you need to do the next day. 8. Don't nap unless you feel sick: you'll have a better night's sleep. 9. Don't smoke, or quit if you do. Nicotine, alcohol, and marijuana can all keep you awake. Talk to your health care provider if you need help with substance use. 10. Most importantly, wake up at the same time every day (or within 1 hour of your usual wake up time) EVEN on the weekends. A regular wake up time promotes sleep hygiene and prevents sleep problems. 11. Reduce exposure to bright light in the last three hours of the day before going to sleep. Maintaining good sleep hygiene and having good sleep habits lower your risk of developing sleep  problems. Getting better sleep can also improve your concentration and alertness. Try the simple steps in this guide. If you still have trouble getting enough rest, make an appointment with your health care provider.   Generalized Anxiety Disorder, Adult Generalized anxiety disorder (GAD) is a mental health disorder. People with this condition constantly worry about everyday events. Unlike normal anxiety, worry related to GAD is not triggered by a specific event. These worries also do not fade or get better with time. GAD interferes with life functions, including relationships, work, and school. GAD can vary from mild to severe. People with severe GAD can have intense waves of anxiety with physical symptoms (panic attacks). What are the causes? The exact cause of GAD is not known. What increases the risk? This condition is more likely to develop in:  Women.  People who have a family history of anxiety disorders.  People who are very shy.  People who experience very stressful life events, such as the death of a loved one.  People who have a very stressful family environment.  What are the signs or symptoms? People with GAD often worry excessively about many things in their lives, such as their health and family. They may also be overly concerned about:  Doing well at work.  Being on time.  Natural disasters.  Friendships.  Physical symptoms of GAD include:  Fatigue.  Muscle tension or having  muscle twitches.  Trembling or feeling shaky.  Being easily startled.  Feeling like your heart is pounding or racing.  Feeling out of breath or like you cannot take a deep breath.  Having trouble falling asleep or staying asleep.  Sweating.  Nausea, diarrhea, or irritable bowel syndrome (IBS).  Headaches.  Trouble concentrating or remembering facts.  Restlessness.  Irritability.  How is this diagnosed? Your health care provider can diagnose GAD based on your symptoms  and medical history. You will also have a physical exam. The health care provider will ask specific questions about your symptoms, including how severe they are, when they started, and if they come and go. Your health care provider may ask you about your use of alcohol or drugs, including prescription medicines. Your health care provider may refer you to a mental health specialist for further evaluation. Your health care provider will do a thorough examination and may perform additional tests to rule out other possible causes of your symptoms. To be diagnosed with GAD, a person must have anxiety that:  Is out of his or her control.  Affects several different aspects of his or her life, such as work and relationships.  Causes distress that makes him or her unable to take part in normal activities.  Includes at least three physical symptoms of GAD, such as restlessness, fatigue, trouble concentrating, irritability, muscle tension, or sleep problems.  Before your health care provider can confirm a diagnosis of GAD, these symptoms must be present more days than they are not, and they must last for six months or longer. How is this treated? The following therapies are usually used to treat GAD:  Medicine. Antidepressant medicine is usually prescribed for long-term daily control. Antianxiety medicines may be added in severe cases, especially when panic attacks occur.  Talk therapy (psychotherapy). Certain types of talk therapy can be helpful in treating GAD by providing support, education, and guidance. Options include: ? Cognitive behavioral therapy (CBT). People learn coping skills and techniques to ease their anxiety. They learn to identify unrealistic or negative thoughts and behaviors and to replace them with positive ones. ? Acceptance and commitment therapy (ACT). This treatment teaches people how to be mindful as a way to cope with unwanted thoughts and feelings. ? Biofeedback. This process  trains you to manage your body's response (physiological response) through breathing techniques and relaxation methods. You will work with a therapist while machines are used to monitor your physical symptoms.  Stress management techniques. These include yoga, meditation, and exercise.  A mental health specialist can help determine which treatment is best for you. Some people see improvement with one type of therapy. However, other people require a combination of therapies. Follow these instructions at home:  Take over-the-counter and prescription medicines only as told by your health care provider.  Try to maintain a normal routine.  Try to anticipate stressful situations and allow extra time to manage them.  Practice any stress management or self-calming techniques as taught by your health care provider.  Do not punish yourself for setbacks or for not making progress.  Try to recognize your accomplishments, even if they are small.  Keep all follow-up visits as told by your health care provider. This is important. Contact a health care provider if:  Your symptoms do not get better.  Your symptoms get worse.  You have signs of depression, such as: ? A persistently sad, cranky, or irritable mood. ? Loss of enjoyment in activities that used to bring  you joy. ? Change in weight or eating. ? Changes in sleeping habits. ? Avoiding friends or family members. ? Loss of energy for normal tasks. ? Feelings of guilt or worthlessness. Get help right away if:  You have serious thoughts about hurting yourself or others. If you ever feel like you may hurt yourself or others, or have thoughts about taking your own life, get help right away. You can go to your nearest emergency department or call:  Your local emergency services (911 in the [place]  A suicide crisis helpline, such as the National Suicide Prevention Lifeline at 9158639412. This is open 24 hours a  day.  Summary  Generalized anxiety disorder (GAD) is a mental health disorder that involves worry that is not triggered by a specific event.  People with GAD often worry excessively about many things in their lives, such as their health and family.  GAD may cause physical symptoms such as restlessness, trouble concentrating, sleep problems, frequent sweating, nausea, diarrhea, headaches, and trembling or muscle twitching.  A mental health specialist can help determine which treatment is best for you. Some people see improvement with one type of therapy. However, other people require a combination of therapies. This information is not intended to replace advice given to you by your health care provider. Make sure you discuss any questions you have with your health care provider. Document Released: 12/22/2012 Document Revised: 07/17/2016 Document Reviewed: 07/17/2016 Elsevier Interactive Patient Education  [employer].

## 2017-05-28 LAB — URINALYSIS W MICROSCOPIC + REFLEX CULTURE
BACTERIA UA: NONE SEEN /HPF
BILIRUBIN URINE: NEGATIVE
Glucose, UA: NEGATIVE
Hyaline Cast: NONE SEEN /LPF
KETONES UR: NEGATIVE
Leukocyte Esterase: NEGATIVE
Nitrites, Initial: NEGATIVE
PH: 6 (ref 5.0–8.0)
PROTEIN: NEGATIVE
SPECIFIC GRAVITY, URINE: 1.026 (ref 1.001–1.03)
Squamous Epithelial / LPF: NONE SEEN /HPF (ref <=5)
WBC, UA: NONE SEEN /HPF (ref 0–5)

## 2017-05-28 LAB — IRON, TOTAL/TOTAL IRON BINDING CAP
%SAT: 23 % (calc) (ref 11–50)
IRON: 83 ug/dL (ref 40–190)
TIBC: 368 mcg/dL (calc) (ref 250–450)

## 2017-05-28 LAB — HEPATIC FUNCTION PANEL
AG RATIO: 1.1 (calc) (ref 1.0–2.5)
ALBUMIN MSPROF: 4 g/dL (ref 3.6–5.1)
ALT: 15 U/L (ref 6–29)
AST: 19 U/L (ref 10–30)
Alkaline phosphatase (APISO): 85 U/L (ref 33–115)
BILIRUBIN INDIRECT: 0.3 mg/dL (ref 0.2–1.2)
Bilirubin, Direct: 0.1 mg/dL (ref 0.0–0.2)
GLOBULIN: 3.7 g/dL (ref 1.9–3.7)
TOTAL PROTEIN: 7.7 g/dL (ref 6.1–8.1)
Total Bilirubin: 0.4 mg/dL (ref 0.2–1.2)

## 2017-05-28 LAB — BASIC METABOLIC PANEL WITH GFR
BUN: 13 mg/dL (ref 7–25)
CALCIUM: 9.7 mg/dL (ref 8.6–10.2)
CHLORIDE: 104 mmol/L (ref 98–110)
CO2: 25 mmol/L (ref 20–32)
CREATININE: 0.8 mg/dL (ref 0.50–1.10)
GFR, Est African American: 110 mL/min/{1.73_m2} (ref >=60)
GFR, Est Non African American: 95 mL/min/{1.73_m2} (ref >=60)
GLUCOSE: 95 mg/dL (ref 65–99)
Potassium: 4.4 mmol/L (ref 3.5–5.3)
Sodium: 138 mmol/L (ref 135–146)

## 2017-05-28 LAB — LIPID PANEL
CHOL/HDL RATIO: 3.2 (calc) (ref <5.0)
CHOLESTEROL: 212 mg/dL — AB (ref <200)
HDL: 67 mg/dL (ref >50)
LDL CHOLESTEROL (CALC): 126 mg/dL — AB
NON-HDL CHOLESTEROL (CALC): 145 mg/dL — AB (ref <130)
Triglycerides: 91 mg/dL (ref <150)

## 2017-05-28 LAB — VITAMIN B12: Vitamin B-12: 348 pg/mL (ref 200–1100)

## 2017-05-28 LAB — CBC WITH DIFFERENTIAL/PLATELET
Basophils Absolute: 18 cells/uL (ref 0–200)
Basophils Relative: 0.3 %
EOS PCT: 1.2 %
Eosinophils Absolute: 72 cells/uL (ref 15–500)
HEMATOCRIT: 40 % (ref 35.0–45.0)
Hemoglobin: 12.8 g/dL (ref 11.7–15.5)
LYMPHS ABS: 2202 {cells}/uL (ref 850–3900)
MCH: 28.2 pg (ref 27.0–33.0)
MCHC: 32 g/dL (ref 32.0–36.0)
MCV: 88.1 fL (ref 80.0–100.0)
MPV: 11.7 fL (ref 7.5–12.5)
Monocytes Relative: 4.7 %
NEUTROS ABS: 3426 {cells}/uL (ref 1500–7800)
Neutrophils Relative %: 57.1 %
Platelets: 225 10*3/uL (ref 140–400)
RBC: 4.54 10*6/uL (ref 3.80–5.10)
RDW: 13.1 % (ref 11.0–15.0)
Total Lymphocyte: 36.7 %
WBC mixed population: 282 cells/uL (ref 200–950)
WBC: 6 10*3/uL (ref 3.8–10.8)

## 2017-05-28 LAB — MICROALBUMIN / CREATININE URINE RATIO
CREATININE, URINE: 191 mg/dL (ref 20–275)
Microalb Creat Ratio: 4 mcg/mg creat (ref <30)
Microalb, Ur: 0.7 mg/dL

## 2017-05-28 LAB — MAGNESIUM: Magnesium: 1.9 mg/dL (ref 1.5–2.5)

## 2017-05-28 LAB — VITAMIN D 25 HYDROXY (VIT D DEFICIENCY, FRACTURES): Vit D, 25-Hydroxy: 86 ng/mL (ref 30–100)

## 2017-05-28 LAB — NO CULTURE INDICATED

## 2017-05-28 LAB — TSH: TSH: 0.76 mIU/L

## 2017-05-29 ENCOUNTER — Other Ambulatory Visit: Payer: Self-pay

## 2017-05-29 NOTE — Telephone Encounter (Signed)
ENTERED IN ERROR

## 2017-06-04 ENCOUNTER — Other Ambulatory Visit: Payer: Self-pay | Admitting: Physician Assistant

## 2017-06-04 MED ORDER — TRAZODONE HCL 50 MG PO TABS: ORAL_TABLET | ORAL | 2 refills | Status: DC

## 2017-06-05 ENCOUNTER — Other Ambulatory Visit: Payer: Self-pay | Admitting: Physician Assistant

## 2017-06-05 MED ORDER — TRAZODONE HCL 50 MG PO TABS: ORAL_TABLET | ORAL | 2 refills | Status: DC

## 2017-06-18 ENCOUNTER — Other Ambulatory Visit: Payer: Self-pay | Admitting: Physician Assistant

## 2017-06-18 MED ORDER — ESZOPICLONE 3 MG PO TABS: 3.0000 mg | ORAL_TABLET | Freq: Every evening | ORAL | 3 refills | Status: DC | PRN

## 2017-07-10 ENCOUNTER — Ambulatory Visit (INDEPENDENT_AMBULATORY_CARE_PROVIDER_SITE_OTHER): Payer: PRIVATE HEALTH INSURANCE

## 2017-07-10 DIAGNOSIS — Z23 Encounter for immunization: Secondary | ICD-10-CM | POA: Diagnosis not present

## 2017-07-10 NOTE — Progress Notes (Signed)
PT PRESENTS FOR FLU VACCINE 

## 2017-07-29 ENCOUNTER — Other Ambulatory Visit: Payer: Self-pay | Admitting: Physician Assistant

## 2017-07-29 MED ORDER — PHENTERMINE HCL 37.5 MG PO TABS: 37.5000 mg | ORAL_TABLET | Freq: Every day | ORAL | 2 refills | Status: DC

## 2017-10-17 ENCOUNTER — Other Ambulatory Visit: Payer: Self-pay | Admitting: Internal Medicine

## 2017-10-17 MED ORDER — PREDNISONE 20 MG PO TABS: ORAL_TABLET | ORAL | 0 refills | Status: DC

## 2017-10-17 MED ORDER — AZITHROMYCIN 250 MG PO TABS: ORAL_TABLET | ORAL | 1 refills | Status: DC

## 2017-11-15 ENCOUNTER — Other Ambulatory Visit: Payer: Self-pay | Admitting: Internal Medicine

## 2017-11-15 DIAGNOSIS — Z30019 Encounter for initial prescription of contraceptives, unspecified: Secondary | ICD-10-CM

## 2017-12-06 ENCOUNTER — Other Ambulatory Visit: Payer: Self-pay | Admitting: Physician Assistant

## 2017-12-06 ENCOUNTER — Telehealth: Payer: Self-pay | Admitting: Physician Assistant

## 2017-12-06 MED ORDER — BUPROPION HCL ER (XL) 150 MG PO TB24: 150.0000 mg | ORAL_TABLET | ORAL | 2 refills | Status: DC

## 2017-12-06 NOTE — Telephone Encounter (Signed)
Patient is calling complaining of decreased concentration and stress. Will try wellbutrin, if this does not help or need further assistant suggest office visit.

## 2017-12-26 ENCOUNTER — Telehealth: Payer: Self-pay | Admitting: Physician Assistant

## 2017-12-26 MED ORDER — PHENTERMINE HCL 37.5 MG PO TABS
37.5000 mg | ORAL_TABLET | Freq: Every day | ORAL | 0 refills | Status: DC
Start: 2017-12-26 — End: 2018-01-23

## 2017-12-26 NOTE — Telephone Encounter (Signed)
-----   Message from Gregery NaAngela D Duff, CMA sent at 12/26/2017 11:50 AM EDT ----- Regarding: MEDS Pt reports Wellbutrin is not working for her wt loss. Reports she eats more now. Pt reports that the last diet pill you had her on really worked & would like to restart the meds. Pt reports she can pay out of pocket for pills, just really wants to lose wt.  Last diet pill from chart was PHENTERMINE. Pt last time pt had meds was back in NOV. Pt wanted to see you today but you had no room on your schedule.

## 2017-12-30 NOTE — Telephone Encounter (Signed)
Pt has been informed & voiced understanding

## 2018-01-23 ENCOUNTER — Ambulatory Visit: Payer: PRIVATE HEALTH INSURANCE | Admitting: Adult Health

## 2018-01-23 ENCOUNTER — Encounter: Payer: Self-pay | Admitting: Adult Health

## 2018-01-23 VITALS — BP 122/80 | HR 78 | Temp 97.5°F | Ht 64.5 in | Wt 240.0 lb

## 2018-01-23 DIAGNOSIS — E785 Hyperlipidemia, unspecified: Secondary | ICD-10-CM

## 2018-01-23 DIAGNOSIS — Z79899 Other long term (current) drug therapy: Secondary | ICD-10-CM

## 2018-01-23 DIAGNOSIS — R7309 Other abnormal glucose: Secondary | ICD-10-CM

## 2018-01-23 DIAGNOSIS — Z6841 Body Mass Index (BMI) 40.0 and over, adult: Principal | ICD-10-CM

## 2018-01-23 DIAGNOSIS — J4520 Mild intermittent asthma, uncomplicated: Secondary | ICD-10-CM

## 2018-01-23 DIAGNOSIS — J302 Other seasonal allergic rhinitis: Secondary | ICD-10-CM | POA: Insufficient documentation

## 2018-01-23 MED ORDER — PHENTERMINE HCL 37.5 MG PO TABS
ORAL_TABLET | ORAL | 2 refills | Status: DC
Start: 2018-01-23 — End: 2018-08-22

## 2018-01-23 MED ORDER — LEVOCETIRIZINE DIHYDROCHLORIDE 5 MG PO TABS: 5.0000 mg | ORAL_TABLET | Freq: Every evening | ORAL | 2 refills | Status: DC

## 2018-01-23 NOTE — Progress Notes (Signed)
FOLLOW UP  Assessment and Plan:   Asthma Mild, intermittent Symptoms controlled off of medications at this time  Cholesterol Currently mild elevation; not currently on medications Continue low cholesterol diet and exercise.  Check lipid panel.   Other abnormal glucose Has gained significant weight since last check Continue diet and exercise.  Perform daily foot/skin check, notify office of any concerning changes.  Check A1C  Obesity with co morbidities Long discussion about weight loss, diet, and exercise Recommended diet heavy in fruits and veggies and low in animal meats, cheeses, and dairy products, appropriate calorie intake Discussed ideal weight for height and initial weight goal (200 lb) Restarting phentermine today; discussed risks/SE, reminded to take drug holiday at least every 3 months.  Will follow up in 3 months  Vitamin D Def At goal at last visit; continue supplementation to maintain goal of 70-100 Defer Vit D level  Continue diet and meds as discussed. Further disposition pending results of labs. Discussed med's effects and SE's.   Over 30 minutes of exam, counseling, chart review, and critical decision making was performed.   Future Appointments  Date Time Provider Department Center  05/27/2018  2:00 PM Quentin Mulling, PA-C [id number] None    ----------------------------------------------------------------------------------------------------------------------  HPI 37 y.o. female  presents for 6 month follow up on cholesterol, glucose management, obestiy and vitamin D deficiency.   BMI is Body mass index is 40.56 kg/m., she has been working on diet and exercise. Has not been taking phentermine for several months but would like to restart. Goal weight is 200 lb.  Wt Readings from Last 3 Encounters:  01/23/18 240 lb (108.9 kg)  05/27/17 220 lb (99.8 kg)  03/26/17 239 lb (108.4 kg)   Today their BP is BP: 122/80  She does workout. She denies chest  pain, shortness of breath, dizziness.   She is not on cholesterol medication and denies myalgias. Her cholesterol is not at goal. The cholesterol last visit was:   Lab Results  Component Value Date   CHOL 212 (H) 05/27/2017   HDL 67 05/27/2017   LDLCALC 126 (H) 05/27/2017   TRIG 91 05/27/2017   CHOLHDL 3.2 05/27/2017    She has been working on diet and exercise for glucose management, and denies foot ulcerations, increased appetite, nausea, paresthesia of the feet, polydipsia, polyuria, visual disturbances, vomiting and weight loss. Last A1C in the office was:  Lab Results  Component Value Date   HGBA1C 5.5 08/16/2015   Patient is on Vitamin D supplement and at goal at last check:    Lab Results  Component Value Date   VD25OH 86 05/27/2017        Current Medications:  Current Outpatient Medications on File Prior to Visit  Medication Sig  . CRYSELLE-28 0.3-30 MG-MCG tablet TAKE 1 TABLET BY MOUTH ONCE DAILY  . fluticasone (FLONASE) 50 MCG/ACT nasal spray Place 1 spray into both nostrils at bedtime.  . Eszopiclone (ESZOPICLONE) 3 MG TABS Take 1 tablet (3 mg total) by mouth at bedtime as needed. Take immediately before bedtime (Patient not taking: Reported on 01/23/2018)   No current facility-administered medications on file prior to visit.      Allergies: No Known Allergies   Medical History:  Past Medical History:  Diagnosis Date  . Abnormal Pap smear 2011  . Asthma    "out grew it" no inhaler  . Dysplasia of cervix, low grade (CIN 1)   . Generalized anxiety disorder 12/23/2014  . H/O cystitis 2002  .  H/O pyelonephritis 01-30-01  . H/O rubella   . H/O varicella   . History of cesarean delivery Apr 05, 2012   2011-01-31 for breech   . History of chlamydia 12/27/2000  . History of induced abortion April 05, 2012   x2   . History of PID 03/29/2006  . Monilial vaginitis 08/2006  . Obese   . Pain pelvic 30-Jan-2001  . Unexpected sudden death of infant 05-Apr-2012   01/31/2011, SIDS at 3mos    . Vaginal  bleeding in pregnancy Jan 31, 2012   Family history- Reviewed and unchanged Social history- Reviewed and unchanged   Review of Systems:  Review of Systems  Constitutional: Negative for malaise/fatigue and weight loss.  HENT: Negative for hearing loss and tinnitus.   Eyes: Negative for blurred vision and double vision.  Respiratory: Negative for cough, shortness of breath and wheezing.   Cardiovascular: Negative for chest pain, palpitations, orthopnea, claudication and leg swelling.  Gastrointestinal: Negative for abdominal pain, blood in stool, constipation, diarrhea, heartburn, melena, nausea and vomiting.  Genitourinary: Negative.   Musculoskeletal: Negative for joint pain and myalgias.  Skin: Negative for rash.  Neurological: Negative for dizziness, tingling, sensory change, weakness and headaches.  Endo/Heme/Allergies: Negative for polydipsia.  Psychiatric/Behavioral: Negative.   All other systems reviewed and are negative.   Physical Exam: BP 122/80   Pulse 78   Temp (!) 97.5 F (36.4 C)   Ht 5' 4.5" (1.638 m)   Wt 240 lb (108.9 kg)   SpO2 98%   BMI 40.56 kg/m  Wt Readings from Last 3 Encounters:  01/23/18 240 lb (108.9 kg)  05/27/17 220 lb (99.8 kg)  03/26/17 239 lb (108.4 kg)   General Appearance: Well nourished, in no apparent distress. Eyes: PERRLA, EOMs, conjunctiva no swelling or erythema Sinuses: No Frontal/maxillary tenderness ENT/Mouth: Ext aud canals clear, TMs without erythema, bulging. No erythema, swelling, or exudate on post pharynx.  Tonsils not swollen or erythematous. Hearing normal.  Neck: Supple, thyroid normal.  Respiratory: Respiratory effort normal, BS equal bilaterally without rales, rhonchi, wheezing or stridor.  Cardio: RRR with no MRGs. Brisk peripheral pulses without edema.  Abdomen: Soft, + BS.  Non tender, no guarding, rebound, hernias, masses. Lymphatics: Non tender without lymphadenopathy.  Musculoskeletal: Full ROM, 5/5 strength, Normal  gait Skin: Warm, dry without rashes, lesions, ecchymosis.  Neuro: Cranial nerves intact. No cerebellar symptoms.  Psych: Awake and oriented X 3, normal affect, Insight and Judgment appropriate.   Dan Maker, NP 9:41 AM [facility]

## 2018-01-23 NOTE — Patient Instructions (Addendum)
Aim for 7+ servings of fruits and vegetables daily  Choose whole foods - avoid processed as much as possible  80+ fluid ounces of water or unsweet tea for healthy kidneys  Limit alcohol intake  Limit animal fats in diet for cholesterol and heart health - choose grass fed whenever available  Aim for low stress - take time to unwind and care for your mental health  Aim for 150 min of moderate intensity exercise weekly for heart health, and weights twice weekly for bone health  Aim for 7-9 hours of sleep daily      When it comes to diets, agreement about the perfect plan isn't easy to find, even among the experts. Experts at the [facility] of [employer] developed an idea known as the Healthy Eating Plate. Just imagine a plate divided into logical, healthy portions.  The emphasis is on diet quality:  Load up on vegetables and fruits - one-half of your plate: Aim for color and variety, and remember that potatoes don't count.  Go for whole grains - one-quarter of your plate: Whole wheat, barley, wheat berries, quinoa, oats, brown rice, and foods made with them. If you want pasta, go with whole wheat pasta.  Protein power - one-quarter of your plate: Fish, chicken, beans, and nuts are all healthy, versatile protein sources. Limit red meat.  The diet, however, does go beyond the plate, offering a few other suggestions.  Use healthy plant oils, such as olive, canola, soy, corn, sunflower and peanut. Check the labels, and avoid partially hydrogenated oil, which have unhealthy trans fats.  If you're thirsty, drink water. Coffee and tea are good in moderation, but skip sugary drinks and limit milk and dairy products to one or two daily servings.  The type of carbohydrate in the diet is more important than the amount. Some sources of carbohydrates, such as vegetables, fruits, whole grains, and beans-are healthier than others.  Finally, stay active.

## 2018-01-24 LAB — LIPID PANEL
Cholesterol: 201 mg/dL — ABNORMAL HIGH (ref <200)
HDL: 51 mg/dL (ref >50)
LDL CHOLESTEROL (CALC): 129 mg/dL — AB
NON-HDL CHOLESTEROL (CALC): 150 mg/dL — AB (ref <130)
Total CHOL/HDL Ratio: 3.9 (calc) (ref <5.0)
Triglycerides: 105 mg/dL (ref <150)

## 2018-01-24 LAB — COMPLETE METABOLIC PANEL WITH GFR
AG Ratio: 1.1 (calc) (ref 1.0–2.5)
ALBUMIN MSPROF: 4.2 g/dL (ref 3.6–5.1)
ALT: 14 U/L (ref 6–29)
AST: 16 U/L (ref 10–30)
Alkaline phosphatase (APISO): 76 U/L (ref 33–115)
BUN: 10 mg/dL (ref 7–25)
CALCIUM: 9.9 mg/dL (ref 8.6–10.2)
CO2: 27 mmol/L (ref 20–32)
Chloride: 102 mmol/L (ref 98–110)
Creat: 0.82 mg/dL (ref 0.50–1.10)
GFR, EST AFRICAN AMERICAN: 107 mL/min/{1.73_m2} (ref >=60)
GFR, EST NON AFRICAN AMERICAN: 92 mL/min/{1.73_m2} (ref >=60)
GLOBULIN: 3.7 g/dL (ref 1.9–3.7)
Glucose, Bld: 85 mg/dL (ref 65–99)
Potassium: 4.5 mmol/L (ref 3.5–5.3)
SODIUM: 137 mmol/L (ref 135–146)
TOTAL PROTEIN: 7.9 g/dL (ref 6.1–8.1)
Total Bilirubin: 0.3 mg/dL (ref 0.2–1.2)

## 2018-01-24 LAB — CBC WITH DIFFERENTIAL/PLATELET
BASOS ABS: 53 {cells}/uL (ref 0–200)
BASOS PCT: 0.7 %
EOS PCT: 2.9 %
Eosinophils Absolute: 220 cells/uL (ref 15–500)
HEMATOCRIT: 39.6 % (ref 35.0–45.0)
HEMOGLOBIN: 13.3 g/dL (ref 11.7–15.5)
LYMPHS ABS: 3800 {cells}/uL (ref 850–3900)
MCH: 28.7 pg (ref 27.0–33.0)
MCHC: 33.6 g/dL (ref 32.0–36.0)
MCV: 85.3 fL (ref 80.0–100.0)
MPV: 11.1 fL (ref 7.5–12.5)
Monocytes Relative: 5.3 %
Neutro Abs: 3124 cells/uL (ref 1500–7800)
Neutrophils Relative %: 41.1 %
Platelets: 261 10*3/uL (ref 140–400)
RBC: 4.64 10*6/uL (ref 3.80–5.10)
RDW: 12.9 % (ref 11.0–15.0)
Total Lymphocyte: 50 %
WBC mixed population: 403 cells/uL (ref 200–950)
WBC: 7.6 10*3/uL (ref 3.8–10.8)

## 2018-01-24 LAB — TSH: TSH: 1.6 mIU/L

## 2018-01-24 LAB — HEMOGLOBIN A1C
HEMOGLOBIN A1C: 5.4 %{Hb} (ref <5.7)
MEAN PLASMA GLUCOSE: 108 (calc)
eAG (mmol/L): 6 (calc)

## 2018-05-23 NOTE — Progress Notes (Signed)
Will reschedule

## 2018-05-27 ENCOUNTER — Ambulatory Visit: Payer: PRIVATE HEALTH INSURANCE | Admitting: Physician Assistant

## 2018-05-27 VITALS — BP 118/70 | Wt 247.0 lb

## 2018-05-27 DIAGNOSIS — F411 Generalized anxiety disorder: Secondary | ICD-10-CM

## 2018-05-27 DIAGNOSIS — J302 Other seasonal allergic rhinitis: Secondary | ICD-10-CM

## 2018-05-27 DIAGNOSIS — J4520 Mild intermittent asthma, uncomplicated: Secondary | ICD-10-CM

## 2018-05-27 DIAGNOSIS — E785 Hyperlipidemia, unspecified: Secondary | ICD-10-CM

## 2018-07-01 ENCOUNTER — Ambulatory Visit (INDEPENDENT_AMBULATORY_CARE_PROVIDER_SITE_OTHER): Payer: PRIVATE HEALTH INSURANCE

## 2018-07-01 VITALS — Temp 98.7°F | Ht 64.5 in | Wt 245.0 lb

## 2018-07-01 DIAGNOSIS — Z23 Encounter for immunization: Secondary | ICD-10-CM | POA: Diagnosis not present

## 2018-07-01 NOTE — Progress Notes (Signed)
Pt reports for INFLUENZA w/o concerns Given in left arm Temp: 98.7

## 2018-08-22 ENCOUNTER — Other Ambulatory Visit: Payer: Self-pay | Admitting: Physician Assistant

## 2018-08-22 DIAGNOSIS — Z6841 Body Mass Index (BMI) 40.0 and over, adult: Principal | ICD-10-CM

## 2018-08-22 DIAGNOSIS — Z30019 Encounter for initial prescription of contraceptives, unspecified: Secondary | ICD-10-CM

## 2018-08-22 MED ORDER — NORGESTREL-ETHINYL ESTRADIOL 0.3-30 MG-MCG PO TABS: 1.0000 | ORAL_TABLET | Freq: Every day | ORAL | 3 refills | Status: DC

## 2018-08-22 MED ORDER — PHENTERMINE HCL 37.5 MG PO TABS: ORAL_TABLET | ORAL | 0 refills | Status: DC

## 2018-08-25 NOTE — Progress Notes (Signed)
Complete Physical  Assessment and Plan: Uncomplicated asthma, unspecified asthma severity, unspecified whether persistent Better with weight loss  Generalized anxiety disorder -     TSH - stress management techniques discussed, increase water, good sleep hygiene discussed, increase exercise, and increase veggies.  -     Hyperlipidemia, unspecified hyperlipidemia type -     TSH -     Lipid panel -continue medications, check lipids, decrease fatty foods, increase activity.   Routine general medical examination at a health care facility  Medication management -     CBC with Differential/Platelet -     BASIC METABOLIC PANEL WITH GFR -     Hepatic function panel -     Magnesium  Vitamin D deficiency -     VITAMIN D 25 Hydroxy (Vit-D Deficiency, Fractures)  Screening, anemia, deficiency, iron -     Iron,Total/Total Iron Binding Cap -     Vitamin B12  Screening for hematuria or proteinuria -     Urinalysis w microscopic + reflex cultur -     Microalbumin / creatinine urine ratio  Class 2 obesity due to excess calories without serious comorbidity with body mass index (BMI) of 37.0 to 37.9 in adult Morbid Obesity with co morbidities - long discussion about weight loss, diet, and exercise   Discussed med's effects and SE's. Screening labs and tests as requested with regular follow-up as recommended. Over 40 minutes of exam, counseling, chart review, and complex, high level critical decision making was performed this visit.   HPI  37 y.o. female  presents for a complete physical and follow up for has Asthma; Morbid obesity (HCC); Generalized anxiety disorder; Hyperlipidemia; and Seasonal allergies on their problem list..  Her blood pressure has been controlled at home, today their BP is BP: 132/88 She does workout, goes to [employer] with cousin that is Systems analyst. She denies chest pain, shortness of breath, dizziness.  She is on bASA every other day.  She is not on  cholesterol medication, was on lovastatin. Her cholesterol is not at goal. The cholesterol last visit was:   Lab Results  Component Value Date   CHOL 201 (H) 01/23/2018   HDL 51 01/23/2018   LDLCALC 129 (H) 01/23/2018   TRIG 105 01/23/2018   CHOLHDL 3.9 01/23/2018    Last A1C in the office was:  Lab Results  Component Value Date   HGBA1C 5.4 01/23/2018   Patient is on Vitamin D supplement, 10,000 daily.   Lab Results  Component Value Date   VD25OH 86 05/27/2017     BMI is Body mass index is 41.3 kg/m., she is working on diet and exercise. 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  Wt Readings from Last 3 Encounters:  08/26/18 244 lb 6.4 oz (110.9 kg)  07/01/18 245 lb (111.1 kg)  05/27/18 247 lb (112 kg)    Current Medications:  Current Outpatient Medications on File Prior to Visit  Medication Sig Dispense Refill  . fluticasone (FLONASE) 50 MCG/ACT nasal spray Place 1 spray into both nostrils at bedtime. 16 g 2  . levocetirizine (XYZAL) 5 MG tablet Take 1 tablet (5 mg total) by mouth every evening. 90 tablet 2  . norgestrel-ethinyl estradiol (CRYSELLE-28) 0.3-30 MG-MCG tablet Take 1 tablet by mouth daily. 84 tablet 3  . phentermine (ADIPEX-P) 37.5 MG tablet Take 1/2-1 tab every AM. 45 tablet 0  . Eszopiclone (ESZOPICLONE) 3 MG TABS Take 1 tablet (3 mg total) by mouth  at bedtime as needed. Take immediately before bedtime (Patient not taking: Reported on 01/23/2018) 30 tablet 3   No current facility-administered medications on file prior to visit.    Allergies:  No Known Allergies Medical History:  She has Asthma; Morbid obesity (HCC); Generalized anxiety disorder; Hyperlipidemia; and Seasonal allergies on their problem list. Health Maintenance:   Immunization History  Administered Date(s) Administered  . Influenza Inj Mdck Quad With Preservative 07/10/2017, 07/01/2018  . Influenza Split 06/18/2012  . Tdap 07/10/2012   Tetanus: 2013 Pneumovax: Prevnar 13:   Flu vaccine: 07/01/2018 Zostavax:  Patient's last menstrual period was 08/23/2018. Pap: 2013 never abnormal  MGM:N/A DEXA: Colonoscopy: due 50 EGD:  Patient Care Team: Lucky CowboyMcKeown, William, MD as PCP - General (Internal Medicine)  Surgical History:  She has a past surgical history that includes Cesarean section; headaches (2013); bilateral tubal ligation; Cesarean section (06/12/2012); and Cystoscopy (07/01/2012). Family History:  Herfamily history includes Cancer in her maternal grandmother; Diabetes in her paternal grandmother; Heart disease in her maternal grandfather; Hyperlipidemia in her maternal grandfather; Hypertension in her maternal grandfather. Social History:  She reports that she has been smoking. She has never used smokeless tobacco. She reports current alcohol use. She reports current drug use. Drug: Marijuana.  Review of Systems: Review of Systems  Constitutional: Negative.   HENT: Negative.   Eyes: Negative.   Respiratory: Negative.   Cardiovascular: Negative.   Gastrointestinal: Negative.   Genitourinary: Negative.   Musculoskeletal: Negative.   Skin: Negative.   Neurological: Negative.   Endo/Heme/Allergies: Negative.   Psychiatric/Behavioral: Negative for depression, hallucinations, memory loss, substance abuse and suicidal ideas. The patient is nervous/anxious and has insomnia.     Physical Exam: Estimated body mass index is 41.3 kg/m as calculated from the following:   Height as of this encounter: 5' 4.5" (1.638 m).   Weight as of this encounter: 244 lb 6.4 oz (110.9 kg). BP 132/88   Pulse 100   Temp (!) 97.5 F (36.4 C)   Ht 5' 4.5" (1.638 m)   Wt 244 lb 6.4 oz (110.9 kg)   LMP 08/23/2018   SpO2 99%   BMI 41.30 kg/m  General Appearance: Well nourished, in no apparent distress.  Eyes: PERRLA, EOMs, conjunctiva no swelling or erythema, normal fundi and vessels.  Sinuses: No Frontal/maxillary tenderness  ENT/Mouth: Ext aud canals clear, normal  light reflex with TMs without erythema, bulging. Good dentition. No erythema, swelling, or exudate on post pharynx. Tonsils not swollen or erythematous. Hearing normal.  Neck: Supple, thyroid normal. No bruits  Respiratory: Respiratory effort normal, BS equal bilaterally without rales, rhonchi, wheezing or stridor.  Cardio: RRR without murmurs, rubs or gallops. Brisk peripheral pulses without edema.  Chest: symmetric, with normal excursions and percussion.  Breasts: defer Abdomen: Soft, nontender, no guarding, rebound, hernias, masses, or organomegaly.  Lymphatics: Non tender without lymphadenopathy.  Genitourinary: defer Musculoskeletal: Full ROM all peripheral extremities,5/5 strength, and normal gait.  Skin: Warm, dry without rashes, lesions, ecchymosis. Neuro: Cranial nerves intact, reflexes equal bilaterally. Normal muscle tone, no cerebellar symptoms. Sensation intact.  Psych: Awake and oriented X 3, normal affect, Insight and Judgment appropriate.    Quentin MullingAmanda Jolee Critcher 9:47 AM [facility]

## 2018-08-26 ENCOUNTER — Other Ambulatory Visit (HOSPITAL_COMMUNITY)
Admission: RE | Admit: 2018-08-26 | Discharge: 2018-08-26 | Disposition: A | Discharge status: Home / Self Care | Payer: No Typology Code available for payment source | Source: Ambulatory Visit | Attending: Physician Assistant | Admitting: Physician Assistant

## 2018-08-26 ENCOUNTER — Encounter: Payer: Self-pay | Admitting: Physician Assistant

## 2018-08-26 ENCOUNTER — Ambulatory Visit (INDEPENDENT_AMBULATORY_CARE_PROVIDER_SITE_OTHER): Payer: Self-pay | Admitting: Physician Assistant

## 2018-08-26 DIAGNOSIS — J302 Other seasonal allergic rhinitis: Secondary | ICD-10-CM

## 2018-08-26 DIAGNOSIS — Z130 Encounter for screening for diseases of the blood and blood-forming organs and certain disorders involving the immune mechanism: Secondary | ICD-10-CM

## 2018-08-26 DIAGNOSIS — E559 Vitamin D deficiency, unspecified: Secondary | ICD-10-CM

## 2018-08-26 DIAGNOSIS — Z79899 Other long term (current) drug therapy: Secondary | ICD-10-CM

## 2018-08-26 DIAGNOSIS — Z6841 Body Mass Index (BMI) 40.0 and over, adult: Secondary | ICD-10-CM

## 2018-08-26 DIAGNOSIS — J4520 Mild intermittent asthma, uncomplicated: Secondary | ICD-10-CM

## 2018-08-26 DIAGNOSIS — Z124 Encounter for screening for malignant neoplasm of cervix: Secondary | ICD-10-CM

## 2018-08-26 DIAGNOSIS — F411 Generalized anxiety disorder: Secondary | ICD-10-CM

## 2018-08-26 DIAGNOSIS — Z30019 Encounter for initial prescription of contraceptives, unspecified: Secondary | ICD-10-CM

## 2018-08-26 DIAGNOSIS — E785 Hyperlipidemia, unspecified: Secondary | ICD-10-CM

## 2018-08-26 DIAGNOSIS — Z0000 Encounter for general adult medical examination without abnormal findings: Secondary | ICD-10-CM

## 2018-08-26 DIAGNOSIS — Z131 Encounter for screening for diabetes mellitus: Secondary | ICD-10-CM

## 2018-08-26 MED ORDER — ESZOPICLONE 3 MG PO TABS: 3.0000 mg | ORAL_TABLET | Freq: Every evening | ORAL | 3 refills | Status: DC | PRN

## 2018-08-26 NOTE — Patient Instructions (Signed)
Check out  Mini habits for weight loss book  2 apps for tracking food is myfitness pal  loseit OR can take picture of your food     When it comes to diets, agreement about the perfect plan isn't easy to find, even among the experts. Experts at the [employer] developed an idea known as the Healthy Eating Plate. Just imagine a plate divided into logical, healthy portions.  The emphasis is on diet quality:  Load up on vegetables and fruits - one-half of your plate: Aim for color and variety, and remember that potatoes don't count.  Go for whole grains - one-quarter of your plate: Whole wheat, barley, wheat berries, quinoa, oats, brown rice, and foods made with them. If you want pasta, go with whole wheat pasta.  Protein power - one-quarter of your plate: Fish, chicken, beans, and nuts are all healthy, versatile protein sources. Limit red meat.  The diet, however, does go beyond the plate, offering a few other suggestions.  Use healthy plant oils, such as olive, canola, soy, corn, sunflower and peanut. Check the labels, and avoid partially hydrogenated oil, which have unhealthy trans fats.  If you're thirsty, drink water. Coffee and tea are good in moderation, but skip sugary drinks and limit milk and dairy products to one or two daily servings.  The type of carbohydrate in the diet is more important than the amount. Some sources of carbohydrates, such as vegetables, fruits, whole grains, and beans-are healthier than others.  Finally, stay active.   Google mindful eating and here are some tips and tricks below.   Rate your hunger before you eat on a scale of 1-10, try to eat closer to a 6 or higher. And if you are at below that, why are you eating? Slow down and listen to your body.        

## 2018-08-27 LAB — VITAMIN D 25 HYDROXY (VIT D DEFICIENCY, FRACTURES): Vit D, 25-Hydroxy: 73 ng/mL (ref 30–100)

## 2018-08-27 LAB — LIPID PANEL
CHOL/HDL RATIO: 4.7 (calc) (ref <5.0)
Cholesterol: 221 mg/dL — ABNORMAL HIGH (ref <200)
HDL: 47 mg/dL — ABNORMAL LOW (ref >50)
LDL Cholesterol (Calc): 146 mg/dL (calc) — ABNORMAL HIGH
NON-HDL CHOLESTEROL (CALC): 174 mg/dL — AB (ref <130)
Triglycerides: 152 mg/dL — ABNORMAL HIGH (ref <150)

## 2018-08-27 LAB — COMPLETE METABOLIC PANEL WITH GFR
AG RATIO: 1.1 (calc) (ref 1.0–2.5)
ALBUMIN MSPROF: 4.1 g/dL (ref 3.6–5.1)
ALKALINE PHOSPHATASE (APISO): 88 U/L (ref 33–115)
ALT: 12 U/L (ref 6–29)
AST: 15 U/L (ref 10–30)
BUN: 14 mg/dL (ref 7–25)
CALCIUM: 10.2 mg/dL (ref 8.6–10.2)
CO2: 27 mmol/L (ref 20–32)
CREATININE: 0.95 mg/dL (ref 0.50–1.10)
Chloride: 104 mmol/L (ref 98–110)
GFR, EST NON AFRICAN AMERICAN: 77 mL/min/{1.73_m2} (ref >=60)
GFR, Est African American: 89 mL/min/{1.73_m2} (ref >=60)
GLOBULIN: 3.6 g/dL (ref 1.9–3.7)
Glucose, Bld: 88 mg/dL (ref 65–99)
POTASSIUM: 4.5 mmol/L (ref 3.5–5.3)
SODIUM: 139 mmol/L (ref 135–146)
Total Bilirubin: 0.3 mg/dL (ref 0.2–1.2)
Total Protein: 7.7 g/dL (ref 6.1–8.1)

## 2018-08-27 LAB — CBC WITH DIFFERENTIAL/PLATELET
ABSOLUTE MONOCYTES: 303 {cells}/uL (ref 200–950)
Basophils Absolute: 36 cells/uL (ref 0–200)
Basophils Relative: 0.4 %
Eosinophils Absolute: 151 cells/uL (ref 15–500)
Eosinophils Relative: 1.7 %
HCT: 41.2 % (ref 35.0–45.0)
Hemoglobin: 13.1 g/dL (ref 11.7–15.5)
Lymphs Abs: 4183 cells/uL — ABNORMAL HIGH (ref 850–3900)
MCH: 27.6 pg (ref 27.0–33.0)
MCHC: 31.8 g/dL — ABNORMAL LOW (ref 32.0–36.0)
MCV: 86.7 fL (ref 80.0–100.0)
MONOS PCT: 3.4 %
MPV: 11.5 fL (ref 7.5–12.5)
NEUTROS PCT: 47.5 %
Neutro Abs: 4228 cells/uL (ref 1500–7800)
PLATELETS: 295 10*3/uL (ref 140–400)
RBC: 4.75 10*6/uL (ref 3.80–5.10)
RDW: 13.1 % (ref 11.0–15.0)
TOTAL LYMPHOCYTE: 47 %
WBC: 8.9 10*3/uL (ref 3.8–10.8)

## 2018-08-27 LAB — TSH: TSH: 1.49 m[IU]/L

## 2018-08-27 LAB — HEMOGLOBIN A1C
Hgb A1c MFr Bld: 5.5 % of total Hgb (ref <5.7)
Mean Plasma Glucose: 111 (calc)
eAG (mmol/L): 6.2 (calc)

## 2018-08-27 LAB — IRON, TOTAL/TOTAL IRON BINDING CAP
%SAT: 21 % (calc) (ref 16–45)
IRON: 68 ug/dL (ref 40–190)
TIBC: 319 ug/dL (ref 250–450)

## 2018-08-27 LAB — VITAMIN B12: VITAMIN B 12: 560 pg/mL (ref 200–1100)

## 2018-08-27 LAB — MAGNESIUM: Magnesium: 1.9 mg/dL (ref 1.5–2.5)

## 2018-09-02 LAB — CYTOLOGY - PAP
DIAGNOSIS: NEGATIVE
HPV (WINDOPATH): NOT DETECTED

## 2019-05-28 ENCOUNTER — Encounter: Payer: Self-pay | Admitting: Physician Assistant

## 2019-08-21 ENCOUNTER — Other Ambulatory Visit: Payer: Self-pay | Admitting: Physician Assistant

## 2019-08-21 DIAGNOSIS — Z30019 Encounter for initial prescription of contraceptives, unspecified: Secondary | ICD-10-CM

## 2019-11-11 ENCOUNTER — Other Ambulatory Visit: Payer: Self-pay | Admitting: Physician Assistant

## 2019-11-11 DIAGNOSIS — Z30019 Encounter for initial prescription of contraceptives, unspecified: Secondary | ICD-10-CM

## 2020-02-01 ENCOUNTER — Other Ambulatory Visit: Payer: Self-pay | Admitting: Physician Assistant

## 2020-02-01 DIAGNOSIS — Z30019 Encounter for initial prescription of contraceptives, unspecified: Secondary | ICD-10-CM

## 2020-02-16 ENCOUNTER — Other Ambulatory Visit: Payer: Self-pay

## 2020-02-16 DIAGNOSIS — Z30019 Encounter for initial prescription of contraceptives, unspecified: Secondary | ICD-10-CM

## 2020-02-16 MED ORDER — LOW-OGESTREL 0.3-30 MG-MCG PO TABS: 1.0000 | ORAL_TABLET | Freq: Every day | ORAL | 0 refills | Status: DC

## 2020-08-03 ENCOUNTER — Other Ambulatory Visit: Payer: Self-pay

## 2020-08-03 ENCOUNTER — Ambulatory Visit (HOSPITAL_COMMUNITY)
Admission: EM | Admit: 2020-08-03 | Discharge: 2020-08-03 | Disposition: A | Discharge status: Home / Self Care | Payer: Self-pay

## 2020-08-15 ENCOUNTER — Other Ambulatory Visit: Payer: Self-pay

## 2020-08-15 ENCOUNTER — Encounter: Payer: Self-pay | Admitting: Family Medicine

## 2020-08-15 ENCOUNTER — Ambulatory Visit (INDEPENDENT_AMBULATORY_CARE_PROVIDER_SITE_OTHER): Payer: Self-pay | Admitting: Family Medicine

## 2020-08-15 VITALS — BP 114/70 | HR 87 | Ht 64.0 in | Wt 258.0 lb

## 2020-08-15 DIAGNOSIS — Z7689 Persons encountering health services in other specified circumstances: Secondary | ICD-10-CM

## 2020-08-15 MED ORDER — SAXENDA 18 MG/3ML ~~LOC~~ SOPN: PEN_INJECTOR | SUBCUTANEOUS | 0 refills | Status: DC

## 2020-08-15 NOTE — Assessment & Plan Note (Signed)
Interested in starting on Saxenda, has previously been on phenteramine and unable to tolerate the side effects.  Discussed dosing on Saxenda and concerns for any history of thyroid concerns, denies.  Will start at 0.6mg  daily x 7 days, followed by increase of 0.6mg  weekly until reaching 3.0mg .  Provided with sample from in clinic supply, patient's new insurance will begin in January and will send into pharmacy at that time.

## 2020-08-15 NOTE — Patient Instructions (Signed)
As we discussed, Bernie Covey does currently partner with Noom (app for weight loss).  This can change at any time.  If you go to [email or web address] Click "Start for free today" Choose: SaxendaCare Only Fill out the "About me" section with your information and email address *It will say, have you been prescribed Saxenda*.. The answer is "yes" Fill in your address  The next page will say "Personalize your SaxendaCare experience by choosing a coaching option now" Can click on "App-based support powered by Noom" Make a password and security question/answer  You will be emailed to confirm your account.  Download the app "Noom"  Can log in with the email address and password you just created  This app works to help build your relationship with food for long term success  We will plan to see you back in 3 months for physical and labs  You will receive a survey after today's visit either digitally by e-mail or paper by [employer]. Your experiences and feedback matter to [place].  Please respond so we know how we are doing as we provide care for you.  Call us with any questions/concerns/needs.  It is my goal to be available to you for your health concerns.  Thanks for choosing me to be a partner in your healthcare needs!  Charlaine Dalton, FNP-C Family Nurse Practitioner [facility] Phone: [phone]

## 2020-08-15 NOTE — Progress Notes (Signed)
Subjective:    Patient ID: Caryl Asp, female    DOB: 09-20-1980, 39 y.o.   MRN: [id number]  JORRYN HERSHBERGER is a 39 y.o. female presenting on 08/15/2020 for Establish Care (weight management )   HPI  Janessa presents to clinic as a new patient for primary care.  Records will be requested from previous PCP.  Past medical, family, and surgical history reviewed w/ pt.  Has acute concerns today for weight management, she is interested in starting on Saxenda.  Denies any other acute concerns today.  No flowsheet data found.  Social History   Tobacco Use  . Smoking status: Former Games developer  . Smokeless tobacco: Never Used  Vaping Use  . Vaping Use: Never used  Substance Use Topics  . Alcohol use: Yes    Alcohol/week: 0.0 standard drinks  . Drug use: Never    Review of Systems  Constitutional: Negative.   HENT: Negative.   Eyes: Negative.   Respiratory: Negative.   Cardiovascular: Negative.   Gastrointestinal: Negative.   Endocrine: Negative.   Genitourinary: Negative.   Musculoskeletal: Negative.   Skin: Negative.   Allergic/Immunologic: Negative.   Neurological: Negative.   Hematological: Negative.   Psychiatric/Behavioral: Negative.    Per HPI unless specifically indicated above     Objective:    BP 114/70 (BP Location: Right Arm, Patient Position: Sitting, Cuff Size: Large)   Pulse 87   Ht 5\' 4"  (1.626 m)   Wt 258 lb (117 kg)   SpO2 100%   BMI 44.29 kg/m   Wt Readings from Last 3 Encounters:  08/15/20 258 lb (117 kg)  08/26/18 244 lb 6.4 oz (110.9 kg)  07/01/18 245 lb (111.1 kg)    Physical Exam Vitals and nursing note reviewed.  Constitutional:      General: She is not in acute distress.    Appearance: Normal appearance. She is well-developed and well-groomed. She is not ill-appearing or toxic-appearing.  HENT:     Head: Normocephalic and atraumatic.     Nose:     Comments: 07/03/18 is in place, covering mouth and nose. Eyes:     General: Lids are  normal. Vision grossly intact.        Right eye: No discharge.        Left eye: No discharge.     Extraocular Movements: Extraocular movements intact.     Conjunctiva/sclera: Conjunctivae normal.     Pupils: Pupils are equal, round, and reactive to light.  Cardiovascular:     Pulses: Normal pulses.  Pulmonary:     Effort: Pulmonary effort is normal. No respiratory distress.  Skin:    General: Skin is warm and dry.     Capillary Refill: Capillary refill takes less than 2 seconds.  Neurological:     General: No focal deficit present.     Mental Status: She is alert and oriented to person, place, and time.  Psychiatric:        Attention and Perception: Attention and perception normal.        Mood and Affect: Mood and affect normal.        Speech: Speech normal.        Behavior: Behavior normal. Behavior is cooperative.        Thought Content: Thought content normal.        Cognition and Memory: Cognition and memory normal.        Judgment: Judgment normal.    Results for orders placed or performed in  visit on 08/26/18  CBC with Differential/Platelet  Result Value Ref Range   WBC 8.9 3.8 - 10.8 Thousand/uL   RBC 4.75 3.80 - 5.10 Million/uL   Hemoglobin 13.1 11.7 - 15.5 g/dL   HCT 18.2 35 - 45 %   MCV 86.7 80.0 - 100.0 fL   MCH 27.6 27.0 - 33.0 pg   MCHC 31.8 (L) 32.0 - 36.0 g/dL   RDW 99.3 71.6 - 96.7 %   Platelets 295 140 - 400 Thousand/uL   MPV 11.5 7.5 - 12.5 fL   Neutro Abs 4,228 1,500 - 7,800 cells/uL   Lymphs Abs 4,183 (H) 850 - 3,900 cells/uL   Absolute Monocytes 303 200 - 950 cells/uL   Eosinophils Absolute 151 15.0 - 500.0 cells/uL   Basophils Absolute 36 0.0 - 200.0 cells/uL   Neutrophils Relative % 47.5 %   Total Lymphocyte 47.0 %   Monocytes Relative 3.4 %   Eosinophils Relative 1.7 %   Basophils Relative 0.4 %  COMPLETE METABOLIC PANEL WITH GFR  Result Value Ref Range   Glucose, Bld 88 65 - 99 mg/dL   BUN 14 7 - 25 mg/dL   Creat 8.93 8.10 - 1.75 mg/dL    GFR, Est Non African American 77 > OR = 60 mL/min/1.3m2   GFR, Est African American 89 > OR = 60 mL/min/1.53m2   BUN/Creatinine Ratio NOT APPLICABLE 6 - 22 (calc)   Sodium 139 135 - 146 mmol/L   Potassium 4.5 3.5 - 5.3 mmol/L   Chloride 104 98 - 110 mmol/L   CO2 27 20 - 32 mmol/L   Calcium 10.2 8.6 - 10.2 mg/dL   Total Protein 7.7 6.1 - 8.1 g/dL   Albumin 4.1 3.6 - 5.1 g/dL   Globulin 3.6 1.9 - 3.7 g/dL (calc)   AG Ratio 1.1 1.0 - 2.5 (calc)   Total Bilirubin 0.3 0.2 - 1.2 mg/dL   Alkaline phosphatase (APISO) 88 33 - 115 U/L   AST 15 10 - 30 U/L   ALT 12 6 - 29 U/L  Lipid panel  Result Value Ref Range   Cholesterol 221 (H) <200 mg/dL   HDL 47 (L) >10 mg/dL   Triglycerides 258 (H) <150 mg/dL   LDL Cholesterol (Calc) 146 (H) mg/dL (calc)   Total CHOL/HDL Ratio 4.7 <5.0 (calc)   Non-HDL Cholesterol (Calc) 174 (H) <130 mg/dL (calc)  TSH  Result Value Ref Range   TSH 1.49 mIU/L  Hemoglobin A1c  Result Value Ref Range   Hgb A1c MFr Bld 5.5 <5.7 % of total Hgb   Mean Plasma Glucose 111 (calc)   eAG (mmol/L) 6.2 (calc)  Magnesium  Result Value Ref Range   Magnesium 1.9 1.5 - 2.5 mg/dL  Vitamin N27  Result Value Ref Range   Vitamin B-12 560 200 - 1,100 pg/mL  Iron,Total/Total Iron Binding Cap  Result Value Ref Range   Iron 68 40 - 190 mcg/dL   TIBC 782 423 - 536 mcg/dL (calc)   %SAT 21 16 - 45 % (calc)  VITAMIN D 25 Hydroxy (Vit-D Deficiency, Fractures)  Result Value Ref Range   Vit D, 25-Hydroxy 73 30 - 100 ng/mL  Cytology - PAP  Result Value Ref Range   Adequacy      Satisfactory for evaluation  endocervical/transformation zone component PRESENT.   Diagnosis      NEGATIVE FOR INTRAEPITHELIAL LESIONS OR MALIGNANCY.   HPV NOT DETECTED    Material Submitted CervicoVaginal Pap [ThinPrep Imaged]  Assessment & Plan:   Problem List Items Addressed This Visit      Other   Morbid obesity (HCC)    Interested in starting on Saxenda, has previously been on  phenteramine and unable to tolerate the side effects.  Discussed dosing on Saxenda and concerns for any history of thyroid concerns, denies.  Will start at 0.6mg  daily x 7 days, followed by increase of 0.6mg  weekly until reaching 3.0mg .  Provided with sample from in clinic supply, patient's new insurance will begin in January and will send into pharmacy at that time.      Relevant Medications   Liraglutide -Weight Management (SAXENDA) 18 MG/3ML SOPN   Encounter to establish care with new doctor - Primary    New patient establishment at [facility] for primary care.  Will RTC in 3-6 months for CPE and labs.         Meds ordered this encounter  Medications  . Liraglutide -Weight Management (SAXENDA) 18 MG/3ML SOPN    Sig: Begin at 0.6mg , increasing by 0.6mg  every 7 days until reaching 3.0mg  daily    Dispense:  15 mL    Refill:  0    Follow up plan: Return in about 3 months (around 11/13/2020) for CPE & Labs.   Charlaine Dalton, FNP Family Nurse Practitioner [facility] 08/15/2020, 1:16 PM

## 2020-08-15 NOTE — Assessment & Plan Note (Signed)
New patient establishment at [facility] for primary care.  Will RTC in 3-6 months for CPE and labs.

## 2020-09-15 ENCOUNTER — Other Ambulatory Visit: Payer: Self-pay | Admitting: Family Medicine

## 2020-09-15 MED ORDER — SAXENDA 18 MG/3ML ~~LOC~~ SOPN: PEN_INJECTOR | SUBCUTANEOUS | 1 refills | Status: DC

## 2020-09-22 ENCOUNTER — Other Ambulatory Visit: Payer: Self-pay

## 2020-09-22 MED ORDER — CRYSELLE-28 0.3-30 MG-MCG PO TABS: 1.0000 | ORAL_TABLET | Freq: Every day | ORAL | 11 refills | Status: DC

## 2020-10-05 ENCOUNTER — Other Ambulatory Visit: Payer: Self-pay | Admitting: Family Medicine

## 2020-10-05 DIAGNOSIS — Z7689 Persons encountering health services in other specified circumstances: Secondary | ICD-10-CM

## 2020-10-31 ENCOUNTER — Encounter: Payer: Self-pay | Admitting: Obstetrics and Gynecology

## 2020-10-31 ENCOUNTER — Ambulatory Visit (INDEPENDENT_AMBULATORY_CARE_PROVIDER_SITE_OTHER): Payer: Self-pay | Admitting: Obstetrics and Gynecology

## 2020-10-31 ENCOUNTER — Other Ambulatory Visit: Payer: Self-pay

## 2020-10-31 DIAGNOSIS — Z6841 Body Mass Index (BMI) 40.0 and over, adult: Secondary | ICD-10-CM

## 2020-10-31 MED ORDER — CONTRAVE 8-90 MG PO TB12: ORAL_TABLET | ORAL | 0 refills | Status: DC

## 2020-10-31 NOTE — Progress Notes (Signed)
Obstetrics & Gynecology Office Visit   Chief Complaint  Patient presents with  . Consult    Referral for weight loss   . Weight Check   The patient is seen in referral at the request of Malfi, Jodelle Gross, FNP from [facility] for weight management.   History of Present Illness: 40 y.o. [phone] female who is seen in referral from [facility], Jodelle Gross, FNP from [facility] for weight management.     She presents for the evaluation of weight gain. She has been at her current weight a while. The patient states the following issues have contributed to her weight problem: having children.  The patient has no additional symptoms. The patient specifically denies memory loss, muscle weakness, excessive thirst, and polyuria. Weight related co-morbidities include no other issues. The patient's past medical history is notable for no issues. She has tried [place] in the past with a lot of  success. The medication cost her too much money.  She used the medication for a month and she lost 10 pounds.  She tried phentermine in the past and she had a racing heart with this.  She also gained the weight right back.    She is active. She goes to the gym a lot.  She tries to control her portions.  She drinks plenty of water.    Past Medical History:  Diagnosis Date  . Abnormal Pap smear 12/29/09  . Asthma    "out grew it" no inhaler  . Dysplasia of cervix, low grade (CIN 1)   . Generalized anxiety disorder 12/23/2014  . H/O cystitis 12/29/00  . H/O pyelonephritis 12-29-00  . H/O rubella   . H/O varicella   . History of cesarean delivery 03-25-12   Dec 30, 2010 for breech   . History of chlamydia 12/27/2000  . History of induced abortion 25-Mar-2012   x2   . History of PID 03/29/2006  . Monilial vaginitis 08/2006  . Obese   . Pain pelvic 12/29/00  . Unexpected sudden death of infant 2012/03/25   30-Dec-2010, SIDS at 73mos    . Vaginal bleeding in pregnancy 12/30/2011    Past Surgical History:  Procedure Laterality  Date  . bilateral tubal ligation    . CESAREAN SECTION    . CESAREAN SECTION  07/04/2012   Procedure: CESAREAN SECTION;  Surgeon: Kirkland Hun, MD;  Location: [facility];  Service: Obstetrics;  Laterality: N/A;  . CYSTOSCOPY  06/27/2012   Procedure: CYSTOSCOPY;  Surgeon: Kirkland Hun, MD;  Location: [facility];  Service: Obstetrics;  Laterality: N/A;  . headaches  2011/12/30    during pregnancy    Gynecologic History: Patient's last menstrual period was 10/24/2020.  Obstetric History: [id number]  Family History  Problem Relation Age of Onset  . Cancer Maternal Grandmother        cervical  . Diabetes Paternal Grandmother   . Heart disease Maternal Grandfather   . Hyperlipidemia Maternal Grandfather   . Hypertension Maternal Grandfather   . Other Neg Hx     Social History   Socioeconomic History  . Marital status: Single    Spouse name: Not on file  . Number of children: Not on file  . Years of education: Not on file  . Highest education level: Not on file  Occupational History  . Not on file  Tobacco Use  . Smoking status: Former Games developer  . Smokeless tobacco: Never Used  Vaping Use  . Vaping Use: Never used  Substance  and Sexual Activity  . Alcohol use: Not Currently    Alcohol/week: 0.0 standard drinks  . Drug use: Never  . Sexual activity: Not Currently    Birth control/protection: Pill  Other Topics Concern  . Not on file  Social History Narrative  . Not on file   Social Determinants of Health   Financial Resource Strain: Not on file  Food Insecurity: Not on file  Transportation Needs: Not on file  Physical Activity: Not on file  Stress: Not on file  Social Connections: Not on file  Intimate Partner Violence: Not on file    No Known Allergies  Prior to Admission medications   Medication Sig Start Date End Date Taking? Authorizing Provider  aspirin EC 81 MG tablet Take 81 mg by mouth daily. Swallow whole.   Yes [provider]  Cholecalciferol  (VITAMIN D3 SUPER STRENGTH PO) Take 1 capsule by mouth daily.   Yes [provider]  norgestrel-ethinyl estradiol (CRYSELLE-28) 0.3-30 MG-MCG tablet Take 1 tablet by mouth daily. 09/22/20  Yes Malfi, Jodelle Gross, FNP  Eszopiclone (ESZOPICLONE) 3 MG TABS Take 1 tablet (3 mg total) by mouth at bedtime as needed. Take immediately before bedtime 08/26/18 08/26/19  Doree Albee, PA-C    Review of Systems  Constitutional: Negative.   HENT: Negative.   Eyes: Negative.   Respiratory: Negative.   Cardiovascular: Negative.   Gastrointestinal: Negative.   Genitourinary: Negative.   Musculoskeletal: Negative.   Skin: Negative.   Neurological: Negative.   Psychiatric/Behavioral: Negative.      Physical Exam BP 118/74   Ht 5\' 4"  (1.626 m)   Wt 244 lb (110.7 kg)   LMP 10/24/2020   BMI 41.88 kg/m  Patient's last menstrual period was 10/24/2020. Physical Exam Constitutional:      General: She is not in acute distress.    Appearance: Normal appearance.  HENT:     Head: Normocephalic and atraumatic.  Eyes:     General: No scleral icterus.    Conjunctiva/sclera: Conjunctivae normal.  Neurological:     General: No focal deficit present.     Mental Status: She is alert and oriented to person, place, and time.     Cranial Nerves: No cranial nerve deficit.  Psychiatric:        Mood and Affect: Mood normal.        Behavior: Behavior normal.        Judgment: Judgment normal.     Female chaperone present for pelvic and breast  portions of the physical exam  Assessment: 40 y.o. 24 female here for  1. Morbid obesity (HCC)   2. BMI 40.0-44.9, adult (HCC)      Plan: Problem List Items Addressed This Visit      Other   Morbid obesity (HCC) - Primary   Relevant Medications   Naltrexone-buPROPion HCl ER (CONTRAVE) 8-90 MG TB12    Other Visit Diagnoses    BMI 40.0-44.9, adult (HCC)       Relevant Medications   Naltrexone-buPROPion HCl ER (CONTRAVE) 8-90 MG TB12     1)  1500 Calorie ADA Diet  2) Patient education given regarding appropriate lifestyle changes for weight loss including: regular physical activity, healthy coping strategies, caloric restriction and healthy eating patterns.  3) Patient will be started on weight loss medication. The risks and benefits and side effects of medication, such as Adipex (Phenteramine) ,  Tenuate (Diethylproprion), Belviq (lorcarsin), Contrave (buproprion/naltrexone), Qsymia (phentermine/topiramate), and Saxenda (liraglutide) is discussed. The pros and cons  of suppressing appetite and boosting metabolism is discussed. Risks of tolerence and addiction is discussed for selected agents discussed. Use of medicine will ne short term, such as 3-4 months at a time followed by a period of time off of the medicine to avoid these risks and side effects for Adipex, Qsymia, and Tenuate discussed. Pt to call with any negative side effects and agrees to keep follow up appts. Rx for Contrave provided. However, she is going to see if she can get Saxenda for a reasonable price, as she really like this medication.   4) Comorbidity Screening - hypothyroidism screening, diabetes, and hyperlipidemia screening offered  5) Encouraged weekly weight monitorig to track progress and sample 1 week food diary  A total of 30 minutes were spent face-to-face with the patient as well as preparation, review, communication, and documentation during this encounter.    Return in about 3 months (around 01/28/2021) for Medication Follow up with Dr. Jean Rosenthal.   Thomasene Mohair, MD 10/31/2020 1:31 PM    CC: Tarri Fuller, FNP [address]  [place], [place] [address]

## 2020-11-03 ENCOUNTER — Encounter: Payer: Self-pay | Admitting: Obstetrics and Gynecology

## 2021-01-03 ENCOUNTER — Other Ambulatory Visit: Payer: Self-pay

## 2021-01-03 DIAGNOSIS — Z6841 Body Mass Index (BMI) 40.0 and over, adult: Secondary | ICD-10-CM

## 2021-01-06 NOTE — Telephone Encounter (Signed)
Patient is scheduled for 01/09/21 for nurse visit for weight check

## 2021-01-09 ENCOUNTER — Ambulatory Visit (INDEPENDENT_AMBULATORY_CARE_PROVIDER_SITE_OTHER): Payer: No Typology Code available for payment source

## 2021-01-09 ENCOUNTER — Other Ambulatory Visit: Payer: Self-pay

## 2021-01-09 NOTE — Progress Notes (Signed)
Pt came in for wt check.  Wt today is 237.8; down from 244.  Needs rx sent in.

## 2021-01-23 ENCOUNTER — Ambulatory Visit: Payer: Self-pay | Admitting: Family Medicine

## 2021-01-24 ENCOUNTER — Telehealth: Payer: Self-pay | Admitting: Internal Medicine

## 2021-01-24 ENCOUNTER — Encounter: Payer: Self-pay | Admitting: Internal Medicine

## 2021-01-24 ENCOUNTER — Ambulatory Visit (INDEPENDENT_AMBULATORY_CARE_PROVIDER_SITE_OTHER): Payer: 59 | Admitting: Internal Medicine

## 2021-01-24 ENCOUNTER — Other Ambulatory Visit: Payer: Self-pay

## 2021-01-24 DIAGNOSIS — F439 Reaction to severe stress, unspecified: Secondary | ICD-10-CM

## 2021-01-24 MED ORDER — SEMAGLUTIDE-WEIGHT MANAGEMENT 0.25 MG/0.5ML ~~LOC~~ SOAJ: SUBCUTANEOUS | 1 refills | Status: DC

## 2021-01-24 NOTE — Patient Instructions (Signed)
Calorie Counting for Weight Loss Calories are units of energy. Your body needs a certain number of calories from food to keep going throughout the day. When you eat or drink more calories than your body needs, your body stores the extra calories mostly as fat. When you eat or drink fewer calories than your body needs, your body burns fat to get the energy it needs. Calorie counting means keeping track of how many calories you eat and drink each day. Calorie counting can be helpful if you need to lose weight. If you eat fewer calories than your body needs, you should lose weight. Ask your health care provider what a healthy weight is for you. For calorie counting to work, you will need to eat the right number of calories each day to lose a healthy amount of weight per week. A dietitian can help you figure out how many calories you need in a day and will suggest ways to reach your calorie goal.  A healthy amount of weight to lose each week is usually 1-2 lb (0.5-0.9 kg). This usually means that your daily calorie intake should be reduced by 500-750 calories.  Eating 1,200-1,500 calories a day can help most women lose weight.  Eating 1,500-1,800 calories a day can help most men lose weight. What do I need to know about calorie counting? Work with your health care provider or dietitian to determine how many calories you should get each day. To meet your daily calorie goal, you will need to:  Find out how many calories are in each food that you would like to eat. Try to do this before you eat.  Decide how much of the food you plan to eat.  Keep a food log. Do this by writing down what you ate and how many calories it had. To successfully lose weight, it is important to balance calorie counting with a healthy lifestyle that includes regular activity. Where do I find calorie information? The number of calories in a food can be found on a Nutrition Facts label. If a food does not have a Nutrition Facts  label, try to look up the calories online or ask your dietitian for help. Remember that calories are listed per serving. If you choose to have more than one serving of a food, you will have to multiply the calories per serving by the number of servings you plan to eat. For example, the label on a package of bread might say that a serving size is 1 slice and that there are 90 calories in a serving. If you eat 1 slice, you will have eaten 90 calories. If you eat 2 slices, you will have eaten 180 calories.   How do I keep a food log? After each time that you eat, record the following in your food log as soon as possible:  What you ate. Be sure to include toppings, sauces, and other extras on the food.  How much you ate. This can be measured in cups, ounces, or number of items.  How many calories were in each food and drink.  The total number of calories in the food you ate. Keep your food log near you, such as in a pocket-sized notebook or on an app or website on your mobile phone. Some programs will calculate calories for you and show you how many calories you have left to meet your daily goal. What are some portion-control tips?  Know how many calories are in a serving. This will   help you know how many servings you can have of a certain food.  Use a measuring cup to measure serving sizes. You could also try weighing out portions on a kitchen scale. With time, you will be able to estimate serving sizes for some foods.  Take time to put servings of different foods on your favorite plates or in your favorite bowls and cups so you know what a serving looks like.  Try not to eat straight from a food's packaging, such as from a bag or box. Eating straight from the package makes it hard to see how much you are eating and can lead to overeating. Put the amount you would like to eat in a cup or on a plate to make sure you are eating the right portion.  Use smaller plates, glasses, and bowls for smaller  portions and to prevent overeating.  Try not to multitask. For example, avoid watching TV or using your computer while eating. If it is time to eat, sit down at a table and enjoy your food. This will help you recognize when you are full. It will also help you be more mindful of what and how much you are eating. What are tips for following this plan? Reading food labels  Check the calorie count compared with the serving size. The serving size may be smaller than what you are used to eating.  Check the source of the calories. Try to choose foods that are high in protein, fiber, and vitamins, and low in saturated fat, trans fat, and sodium. Shopping  Read nutrition labels while you shop. This will help you make healthy decisions about which foods to buy.  Pay attention to nutrition labels for low-fat or fat-free foods. These foods sometimes have the same number of calories or more calories than the full-fat versions. They also often have added sugar, starch, or salt to make up for flavor that was removed with the fat.  Make a grocery list of lower-calorie foods and stick to it. Cooking  Try to cook your favorite foods in a healthier way. For example, try baking instead of frying.  Use low-fat dairy products. Meal planning  Use more fruits and vegetables. One-half of your plate should be fruits and vegetables.  Include lean proteins, such as chicken, turkey, and fish. Lifestyle Each week, aim to do one of the following:  150 minutes of moderate exercise, such as walking.  75 minutes of vigorous exercise, such as running. General information  Know how many calories are in the foods you eat most often. This will help you calculate calorie counts faster.  Find a way of tracking calories that works for you. Get creative. Try different apps or programs if writing down calories does not work for you. What foods should I eat?  Eat nutritious foods. It is better to have a nutritious,  high-calorie food, such as an avocado, than a food with few nutrients, such as a bag of potato chips.  Use your calories on foods and drinks that will fill you up and will not leave you hungry soon after eating. ? Examples of foods that fill you up are nuts and nut butters, vegetables, lean proteins, and high-fiber foods such as whole grains. High-fiber foods are foods with more than 5 g of fiber per serving.  Pay attention to calories in drinks. Low-calorie drinks include water and unsweetened drinks. The items listed above may not be a complete list of foods and beverages you can eat.   Contact a dietitian for more information.   What foods should I limit? Limit foods or drinks that are not good sources of vitamins, minerals, or protein or that are high in unhealthy fats. These include:  Candy.  Other sweets.  Sodas, specialty coffee drinks, alcohol, and juice. The items listed above may not be a complete list of foods and beverages you should avoid. Contact a dietitian for more information. How do I count calories when eating out?  Pay attention to portions. Often, portions are much larger when eating out. Try these tips to keep portions smaller: ? Consider sharing a meal instead of getting your own. ? If you get your own meal, eat only half of it. Before you start eating, ask for a container and put half of your meal into it. ? When available, consider ordering smaller portions from the menu instead of full portions.  Pay attention to your food and drink choices. Knowing the way food is cooked and what is included with the meal can help you eat fewer calories. ? If calories are listed on the menu, choose the lower-calorie options. ? Choose dishes that include vegetables, fruits, whole grains, low-fat dairy products, and lean proteins. ? Choose items that are boiled, broiled, grilled, or steamed. Avoid items that are buttered, battered, fried, or served with cream sauce. Items labeled as  crispy are usually fried, unless stated otherwise. ? Choose water, low-fat milk, unsweetened iced tea, or other drinks without added sugar. If you want an alcoholic beverage, choose a lower-calorie option, such as a glass of wine or light beer. ? Ask for dressings, sauces, and syrups on the side. These are usually high in calories, so you should limit the amount you eat. ? If you want a salad, choose a garden salad and ask for grilled meats. Avoid extra toppings such as bacon, cheese, or fried items. Ask for the dressing on the side, or ask for olive oil and vinegar or lemon to use as dressing.  Estimate how many servings of a food you are given. Knowing serving sizes will help you be aware of how much food you are eating at restaurants. Where to find more information  Centers for Disease Control and Prevention: www.cdc.gov  U.[place]. Department of Agriculture: myplate.gov Summary  Calorie counting means keeping track of how many calories you eat and drink each day. If you eat fewer calories than your body needs, you should lose weight.  A healthy amount of weight to lose per week is usually 1-2 lb (0.5-0.9 kg). This usually means reducing your daily calorie intake by 500-750 calories.  The number of calories in a food can be found on a Nutrition Facts label. If a food does not have a Nutrition Facts label, try to look up the calories online or ask your dietitian for help.  Use smaller plates, glasses, and bowls for smaller portions and to prevent overeating.  Use your calories on foods and drinks that will fill you up and not leave you hungry shortly after a meal. This information is not intended to replace advice given to you by your health care provider. Make sure you discuss any questions you have with your health care provider. Document Revised: 10/08/2019 Document Reviewed: 10/08/2019 Elsevier Patient Education  2021 Elsevier Inc.  

## 2021-01-24 NOTE — Telephone Encounter (Signed)
Jazz with [facility] called needing clarification on the [facility] script that was sent over.  Not sure about the dosing/  CB#  [phone]

## 2021-01-24 NOTE — Progress Notes (Signed)
Subjective:    Patient ID: [name], female    DOB: March 12, 1981, 40 y.o.   MRN: [id number]  HPI  Patient presents to the clinic today for follow-up of abnormal weight gain.  Her current weight is 242 pounds with a BMI of 41.64. She reports stress at home which is contributing to her weight gain. Her max weight was about 290 lbs, her least weight was about 220 lbs. She alternated Saxenda samples and Contrave to help her with weight loss. She is interested in [place].  Breakfast: She does not eat breakfast. Snacks on watermelon, pineapples, apples. Lunch: Salad Dinner: Ground [place] burgers, greens, cabbage, corn, baked beans Snacks: None  Exercise: [employer] 3 x week, 1 hour  Review of Systems      Past Medical History:  Diagnosis Date  . Abnormal Pap smear 01/07/2010  . Asthma    "out grew it" no inhaler  . Dysplasia of cervix, low grade (CIN 1)   . Generalized anxiety disorder 12/23/2014  . H/O cystitis 2001-01-07  . H/O pyelonephritis 2001/01/07  . H/O rubella   . H/O varicella   . History of cesarean delivery 04-Apr-2012   01-08-11 for breech   . History of chlamydia 12/27/2000  . History of induced abortion 04-04-12   x2   . History of PID 03/29/2006  . Monilial vaginitis 08/2006  . Obese   . Pain pelvic 01-07-2001  . Unexpected sudden death of infant April 04, 2012   08-Jan-2011, SIDS at 5mos    . Vaginal bleeding in pregnancy Jan 08, 2012    Current Outpatient Medications  Medication Sig Dispense Refill  . aspirin EC 81 MG tablet Take 81 mg by mouth daily. Swallow whole.    . Cholecalciferol (VITAMIN D3 SUPER STRENGTH PO) Take 1 capsule by mouth daily.    . Eszopiclone (ESZOPICLONE) 3 MG TABS Take 1 tablet (3 mg total) by mouth at bedtime as needed. Take immediately before bedtime 30 tablet 3  . Liraglutide -Weight Management (SAXENDA) 18 MG/3ML SOPN Begin at 0.6mg , increasing by 0.6mg  every 7 days until reaching 3.0mg  daily (Patient not taking: Reported on 10/31/2020) 15 mL 1  . Naltrexone-buPROPion HCl ER  (CONTRAVE) 8-90 MG TB12 1 tablet po once daily for 7 days, 1 tablet po bid for 7 days,  2 tablets po every morning and 1 tablet po every evening for 7 days, then 2 tablets po bid maintenance 84 tablet 0  . norgestrel-ethinyl estradiol (CRYSELLE-28) 0.3-30 MG-MCG tablet Take 1 tablet by mouth daily. 28 tablet 11   No current facility-administered medications for this visit.    No Known Allergies  Family History  Problem Relation Age of Onset  . Cancer Maternal Grandmother        cervical  . Diabetes Paternal Grandmother   . Heart disease Maternal Grandfather   . Hyperlipidemia Maternal Grandfather   . Hypertension Maternal Grandfather   . Other Neg Hx     Social History   Socioeconomic History  . Marital status: Single    Spouse name: Not on file  . Number of children: Not on file  . Years of education: Not on file  . Highest education level: Not on file  Occupational History  . Not on file  Tobacco Use  . Smoking status: Former Games developer  . Smokeless tobacco: Never Used  Vaping Use  . Vaping Use: Never used  Substance and Sexual Activity  . Alcohol use: Not Currently    Alcohol/week: 0.0 standard drinks  .  Drug use: Never  . Sexual activity: Not Currently    Birth control/protection: Pill  Other Topics Concern  . Not on file  Social History Narrative  . Not on file   Social Determinants of Health   Financial Resource Strain: Not on file  Food Insecurity: Not on file  Transportation Needs: Not on file  Physical Activity: Not on file  Stress: Not on file  Social Connections: Not on file  Intimate Partner Violence: Not on file     Constitutional: Pt reports abnormal weight gain. Denies fever, malaise, fatigue, headache.  Respiratory: Denies difficulty breathing, shortness of breath, cough or sputum production.   Cardiovascular: Denies chest pain, chest tightness, palpitations or swelling in the hands or feet.  Psych: Pt reports stress. Denies anxiety, depression,  SI/HI.  No other specific complaints in a complete review of systems (except as listed in HPI above).  Objective:   Physical Exam  BP 110/82 (BP Location: Left Arm, Patient Position: Sitting, Cuff Size: Large)   Pulse 96   Temp (!) 97.5 F (36.4 C) (Temporal)   Resp 17   Ht 5\' 4"  (1.626 m)   Wt 242 lb 9.6 oz (110 kg)   SpO2 100%   BMI 41.64 kg/m   Wt Readings from Last 3 Encounters:  01/09/21 237 lb 12.8 oz (107.9 kg)  10/31/20 244 lb (110.7 kg)  08/15/20 258 lb (117 kg)    General: Appears their stated age, obese, in NAD. HEENT: Head: normal shape and size; Eyes: sclera white and EOMs intact; Cardiovascular: Normal rate and rhythm. S1,S2 noted.  No murmur, rubs or gallops noted.  Pulmonary/Chest: Normal effort and positive vesicular breath sounds. No respiratory distress. No wheezes, rales or ronchi noted.  Neurological: Alert and oriented.  Psychiatric: Mood and affect normal. Behavior is normal. Judgment and thought content normal.     BMET    Component Value Date/Time   NA 139 08/26/2018 1126   K 4.5 08/26/2018 1126   CL 104 08/26/2018 1126   CO2 27 08/26/2018 1126   GLUCOSE 88 08/26/2018 1126   BUN 14 08/26/2018 1126   CREATININE 0.95 08/26/2018 1126   CALCIUM 10.2 08/26/2018 1126   GFRNONAA 77 08/26/2018 1126   GFRAA 89 08/26/2018 1126    Lipid Panel     Component Value Date/Time   CHOL 221 (H) 08/26/2018 1126   TRIG 152 (H) 08/26/2018 1126   HDL 47 (L) 08/26/2018 1126   CHOLHDL 4.7 08/26/2018 1126   VLDL 15 08/16/2015 1354   LDLCALC 146 (H) 08/26/2018 1126    CBC    Component Value Date/Time   WBC 8.9 08/26/2018 1126   RBC 4.75 08/26/2018 1126   HGB 13.1 08/26/2018 1126   HCT 41.2 08/26/2018 1126   PLT 295 08/26/2018 1126   MCV 86.7 08/26/2018 1126   MCH 27.6 08/26/2018 1126   MCHC 31.8 (L) 08/26/2018 1126   RDW 13.1 08/26/2018 1126   LYMPHSABS 4,183 (H) 08/26/2018 1126   MONOABS 0.3 08/16/2015 1354   EOSABS 151 08/26/2018 1126    BASOSABS 36 08/26/2018 1126    Hgb A1C Lab Results  Component Value Date   HGBA1C 5.5 08/26/2018            Assessment & Plan:   Morbid Obesity:  Encouraged low carb, high protein diet Encouraged 150 minutes of exercise weekly Saxenda sample given today Will send in [facility] to see if this is covered by her insurance  RTC in 2 months for  weight check/med refill   Nicki Reaper, NP This visit occurred during the SARS-CoV-2 public health emergency.  Safety protocols were in place, including screening questions prior to the visit, additional usage of staff PPE, and extensive cleaning of exam room while observing appropriate contact time as indicated for disinfecting solutions.

## 2021-01-30 ENCOUNTER — Ambulatory Visit: Payer: Self-pay | Admitting: Family Medicine

## 2021-01-30 ENCOUNTER — Ambulatory Visit: Payer: Self-pay | Admitting: Obstetrics and Gynecology

## 2021-02-10 ENCOUNTER — Encounter: Payer: Self-pay | Admitting: Internal Medicine

## 2021-02-10 DIAGNOSIS — Z6841 Body Mass Index (BMI) 40.0 and over, adult: Secondary | ICD-10-CM

## 2021-02-10 MED ORDER — NALTREXONE-BUPROPION HCL ER 8-90 MG PO TB12: ORAL_TABLET | ORAL | 2 refills | Status: DC

## 2021-02-28 ENCOUNTER — Encounter: Payer: Self-pay | Admitting: Internal Medicine

## 2021-02-28 MED ORDER — SEMAGLUTIDE-WEIGHT MANAGEMENT 0.25 MG/0.5ML ~~LOC~~ SOAJ: 0.2500 mg | SUBCUTANEOUS | 0 refills | Status: DC

## 2021-03-06 ENCOUNTER — Other Ambulatory Visit: Payer: Self-pay | Admitting: Internal Medicine

## 2021-03-06 MED ORDER — PHENTERMINE HCL 37.5 MG PO CAPS: 37.5000 mg | ORAL_CAPSULE | ORAL | 2 refills | Status: DC

## 2021-06-01 ENCOUNTER — Encounter: Payer: Self-pay | Admitting: Internal Medicine

## 2021-06-01 MED ORDER — PHENTERMINE HCL 37.5 MG PO CAPS: 37.5000 mg | ORAL_CAPSULE | ORAL | 2 refills | Status: DC

## 2021-09-25 ENCOUNTER — Encounter: Payer: Self-pay | Admitting: Internal Medicine

## 2021-09-25 ENCOUNTER — Ambulatory Visit (INDEPENDENT_AMBULATORY_CARE_PROVIDER_SITE_OTHER): Payer: 59 | Admitting: Internal Medicine

## 2021-09-25 ENCOUNTER — Other Ambulatory Visit: Payer: Self-pay

## 2021-09-25 DIAGNOSIS — R7303 Prediabetes: Secondary | ICD-10-CM

## 2021-09-25 DIAGNOSIS — F411 Generalized anxiety disorder: Secondary | ICD-10-CM | POA: Diagnosis not present

## 2021-09-25 DIAGNOSIS — G4700 Insomnia, unspecified: Secondary | ICD-10-CM | POA: Insufficient documentation

## 2021-09-25 DIAGNOSIS — J4520 Mild intermittent asthma, uncomplicated: Secondary | ICD-10-CM

## 2021-09-25 DIAGNOSIS — E782 Mixed hyperlipidemia: Secondary | ICD-10-CM | POA: Diagnosis not present

## 2021-09-25 DIAGNOSIS — F5104 Psychophysiologic insomnia: Secondary | ICD-10-CM

## 2021-09-25 MED ORDER — SAXENDA 18 MG/3ML ~~LOC~~ SOPN: PEN_INJECTOR | SUBCUTANEOUS | 0 refills | Status: DC

## 2021-09-25 MED ORDER — INSULIN PEN NEEDLE 31G X 5 MM MISC: 2 refills | Status: DC

## 2021-09-25 MED ORDER — NORGESTIM-ETH ESTRAD TRIPHASIC 0.18/0.215/0.25 MG-25 MCG PO TABS: 1.0000 | ORAL_TABLET | Freq: Every day | ORAL | 11 refills | Status: DC

## 2021-09-25 NOTE — Assessment & Plan Note (Signed)
Encourage low-carb diet and exercise for weight loss We will check A1c with annual exam

## 2021-09-25 NOTE — Assessment & Plan Note (Signed)
Improved with therapy Support offered

## 2021-09-25 NOTE — Assessment & Plan Note (Signed)
We will trial Saxenda, sample given today Encourage high-protein, low-carb diet and exercise for weight loss

## 2021-09-25 NOTE — Patient Instructions (Signed)

## 2021-09-25 NOTE — Assessment & Plan Note (Signed)
We will check c-Met and lipid profile with annual exam °Encouraged her to consume a low-fat diet °

## 2021-09-25 NOTE — Assessment & Plan Note (Signed)
Currently not an issue Will monitor 

## 2021-09-25 NOTE — Progress Notes (Signed)
Subjective:    Patient ID: [name], female    DOB: Oct 11, 1980, 41 y.o.   MRN: [id number]  HPI  Patient presents to clinic today for follow-up of chronic conditions.  Asthma: Mainly in childhood.  She denies cough or shortness of breath at this time.  She is not using any inhalers.  There are no PFTs on file.  GAD: Persistent however she is not taking any medications for this.  She is seeing a therapist and reports this is working really well for her.  She denies depression, SI/HI.  HLD: Her last LDL was 146, triglycerides 152, 08/2018.  She is not taking any cholesterol-lowering medication at this time.  She has been trying to consume a low-fat diet.  Prediabetes: Her last A1c was 5.5%, 08/2018.  She is actively working on weight loss.  She has been on Phentermine and would like to get restarted on something for weight management at this time.  Insomnia: She has some difficulty staying asleep and falling asleep.  She is no longer taking Lunesta.  She uses a calming tea at night with good results.  Review of Systems  Past Medical History:  Diagnosis Date   Abnormal Pap smear 01-22-10   Asthma    "out grew it" no inhaler   Dysplasia of cervix, low grade (CIN 1)    Generalized anxiety disorder 12/23/2014   H/O cystitis Jan 22, 2001   H/O pyelonephritis Jan 22, 2001   H/O rubella    H/O varicella    History of cesarean delivery 2012/04/10   23-Jan-2011 for breech    History of chlamydia 12/27/2000   History of induced abortion 2012-04-10   x2    History of PID 03/29/2006   Monilial vaginitis 08/2006   Obese    Pain pelvic 2001-01-22   Unexpected sudden death of infant 10-Apr-2012   Jan 23, 2011, SIDS at 42mos     Vaginal bleeding in pregnancy 01-23-2012    Current Outpatient Medications  Medication Sig Dispense Refill   aspirin EC 81 MG tablet Take 81 mg by mouth daily. Swallow whole.     Cholecalciferol (VITAMIN D3 SUPER STRENGTH PO) Take 1 capsule by mouth daily.     Eszopiclone (ESZOPICLONE) 3 MG TABS Take 1 tablet (3  mg total) by mouth at bedtime as needed. Take immediately before bedtime 30 tablet 3   norgestrel-ethinyl estradiol (CRYSELLE-28) 0.3-30 MG-MCG tablet Take 1 tablet by mouth daily. (Patient not taking: Reported on 09/25/2021) 28 tablet 11   phentermine 37.5 MG capsule Take 1 capsule (37.5 mg total) by mouth every morning. (Patient not taking: Reported on 09/25/2021) 30 capsule 2   No current facility-administered medications for this visit.    No Known Allergies  Family History  Problem Relation Age of Onset   Cancer Maternal Grandmother        cervical   Diabetes Paternal Grandmother    Heart disease Maternal Grandfather    Hyperlipidemia Maternal Grandfather    Hypertension Maternal Grandfather    Other Neg Hx     Social History   Socioeconomic History   Marital status: Single    Spouse name: Not on file   Number of children: Not on file   Years of education: Not on file   Highest education level: Not on file  Occupational History   Not on file  Tobacco Use   Smoking status: Former   Smokeless tobacco: Never  Vaping Use   Vaping Use: Never used  Substance and Sexual Activity  Alcohol use: Not Currently    Alcohol/week: 0.0 standard drinks   Drug use: Never   Sexual activity: Not Currently    Birth control/protection: Pill  Other Topics Concern   Not on file  Social History Narrative   Not on file   Social Determinants of Health   Financial Resource Strain: Not on file  Food Insecurity: Not on file  Transportation Needs: Not on file  Physical Activity: Not on file  Stress: Not on file  Social Connections: Not on file  Intimate Partner Violence: Not on file     Constitutional: Denies fever, malaise, fatigue, headache or abrupt weight changes.  HEENT: Denies eye pain, eye redness, ear pain, ringing in the ears, wax buildup, runny nose, nasal congestion, bloody nose, or sore throat. Respiratory: Denies difficulty breathing, shortness of breath, cough or sputum  production.   Cardiovascular: Denies chest pain, chest tightness, palpitations or swelling in the hands or feet.  Gastrointestinal: Denies abdominal pain, bloating, constipation, diarrhea or blood in the stool.  GU: Denies urgency, frequency, pain with urination, burning sensation, blood in urine, odor or discharge. Musculoskeletal: Denies decrease in range of motion, difficulty with gait, muscle pain or joint pain and swelling.  Skin: Denies redness, rashes, lesions or ulcercations.  Neurological: Patient reports insomnia.  Denies dizziness, difficulty with memory, difficulty with speech or problems with balance and coordination.  Psych: Patient has a history of anxiety.  Denies depression, SI/HI.  No other specific complaints in a complete review of systems (except as listed in HPI above).     Objective:   Physical Exam BP 106/76 (BP Location: Right Arm, Patient Position: Sitting, Cuff Size: Large)    Pulse 95    Temp 97.8 F (36.6 C) (Temporal)    Resp 17    Ht 5\' 4"  (1.626 m)    Wt 241 lb (109.3 kg)    SpO2 100%    BMI 41.37 kg/m   Wt Readings from Last 3 Encounters:  01/24/21 242 lb 9.6 oz (110 kg)  01/09/21 237 lb 12.8 oz (107.9 kg)  10/31/20 244 lb (110.7 kg)    General: Appears her stated age, obese, in NAD. Skin: Warm, dry and intact.  Cardiovascular: Normal rate. Pulmonary/Chest: Normal effort. Musculoskeletal: No difficulty with gait.  Neurological: Alert and oriented.  Psychiatric: Mood and affect normal. Behavior is normal. Judgment and thought content normal.    BMET    Component Value Date/Time   NA 139 08/26/2018 1126   K 4.5 08/26/2018 1126   CL 104 08/26/2018 1126   CO2 27 08/26/2018 1126   GLUCOSE 88 08/26/2018 1126   BUN 14 08/26/2018 1126   CREATININE 0.95 08/26/2018 1126   CALCIUM 10.2 08/26/2018 1126   GFRNONAA 77 08/26/2018 1126   GFRAA 89 08/26/2018 1126    Lipid Panel     Component Value Date/Time   CHOL 221 (H) 08/26/2018 1126   TRIG 152  (H) 08/26/2018 1126   HDL 47 (L) 08/26/2018 1126   CHOLHDL 4.7 08/26/2018 1126   VLDL 15 08/16/2015 1354   LDLCALC 146 (H) 08/26/2018 1126    CBC    Component Value Date/Time   WBC 8.9 08/26/2018 1126   RBC 4.75 08/26/2018 1126   HGB 13.1 08/26/2018 1126   HCT 41.2 08/26/2018 1126   PLT 295 08/26/2018 1126   MCV 86.7 08/26/2018 1126   MCH 27.6 08/26/2018 1126   MCHC 31.8 (L) 08/26/2018 1126   RDW 13.1 08/26/2018 1126   LYMPHSABS  4,183 (H) 08/26/2018 1126   MONOABS 0.3 08/16/2015 1354   EOSABS 151 08/26/2018 1126   BASOSABS 36 08/26/2018 1126    Hgb A1C Lab Results  Component Value Date   HGBA1C 5.5 08/26/2018            Assessment & Plan:     Nicki Reaper, NP This visit occurred during the SARS-CoV-2 public health emergency.  Safety protocols were in place, including screening questions prior to the visit, additional usage of staff PPE, and extensive cleaning of exam room while observing appropriate contact time as indicated for disinfecting solutions.

## 2021-09-25 NOTE — Assessment & Plan Note (Signed)
She is doing okay with use of calming tea before bed

## 2021-09-26 MED ORDER — PHENTERMINE HCL 37.5 MG PO CAPS: 37.5000 mg | ORAL_CAPSULE | ORAL | 2 refills | Status: DC

## 2021-10-20 ENCOUNTER — Other Ambulatory Visit: Payer: Self-pay

## 2021-10-20 MED ORDER — NORGESTIM-ETH ESTRAD TRIPHASIC 0.18/0.215/0.25 MG-25 MCG PO TABS: 1.0000 | ORAL_TABLET | Freq: Every day | ORAL | 5 refills | Status: DC

## 2022-01-03 ENCOUNTER — Encounter: Payer: Self-pay | Admitting: Internal Medicine

## 2022-01-04 MED ORDER — PHENTERMINE HCL 37.5 MG PO CAPS: 37.5000 mg | ORAL_CAPSULE | ORAL | 2 refills | Status: DC

## 2022-03-16 ENCOUNTER — Encounter: Payer: Self-pay | Admitting: Internal Medicine

## 2022-03-16 MED ORDER — WEGOVY 0.25 MG/0.5ML ~~LOC~~ SOAJ: 0.2500 mg | SUBCUTANEOUS | 0 refills | Status: DC

## 2022-03-19 ENCOUNTER — Ambulatory Visit
Admission: EM | Admit: 2022-03-19 | Discharge: 2022-03-19 | Disposition: A | Discharge status: Home / Self Care | Payer: 59 | Attending: Emergency Medicine | Admitting: Emergency Medicine

## 2022-03-19 DIAGNOSIS — T50905A Adverse effect of unspecified drugs, medicaments and biological substances, initial encounter: Secondary | ICD-10-CM | POA: Diagnosis present

## 2022-03-19 DIAGNOSIS — F411 Generalized anxiety disorder: Secondary | ICD-10-CM | POA: Diagnosis present

## 2022-03-19 LAB — CBC WITH DIFFERENTIAL/PLATELET
Abs Immature Granulocytes: 0.02 10*3/uL (ref 0.00–0.07)
Basophils Absolute: 0 10*3/uL (ref 0.0–0.1)
Basophils Relative: 0 %
Eosinophils Absolute: 0 10*3/uL (ref 0.0–0.5)
Eosinophils Relative: 0 %
HCT: 40.4 % (ref 36.0–46.0)
Hemoglobin: 13.1 g/dL (ref 12.0–15.0)
Immature Granulocytes: 0 %
Lymphocytes Relative: 29 %
Lymphs Abs: 2.8 10*3/uL (ref 0.7–4.0)
MCH: 27.9 pg (ref 26.0–34.0)
MCHC: 32.4 g/dL (ref 30.0–36.0)
MCV: 86.1 fL (ref 80.0–100.0)
Monocytes Absolute: 0.5 10*3/uL (ref 0.1–1.0)
Monocytes Relative: 5 %
Neutro Abs: 6.3 10*3/uL (ref 1.7–7.7)
Neutrophils Relative %: 66 %
Platelets: 311 10*3/uL (ref 150–400)
RBC: 4.69 MIL/uL (ref 3.87–5.11)
RDW: 13.9 % (ref 11.5–15.5)
WBC: 9.6 10*3/uL (ref 4.0–10.5)
nRBC: 0 % (ref 0.0–0.2)

## 2022-03-19 LAB — COMPREHENSIVE METABOLIC PANEL
ALT: 19 U/L (ref 0–44)
AST: 29 U/L (ref 15–41)
Albumin: 3.9 g/dL (ref 3.5–5.0)
Alkaline Phosphatase: 89 U/L (ref 38–126)
Anion gap: 12 (ref 5–15)
BUN: 28 mg/dL — ABNORMAL HIGH (ref 6–20)
CO2: 24 mmol/L (ref 22–32)
Calcium: 9.4 mg/dL (ref 8.9–10.3)
Chloride: 101 mmol/L (ref 98–111)
Creatinine, Ser: 1.17 mg/dL — ABNORMAL HIGH (ref 0.44–1.00)
GFR, Estimated: >60 mL/min (ref >60)
Glucose, Bld: 84 mg/dL (ref 70–99)
Potassium: 3.9 mmol/L (ref 3.5–5.1)
Sodium: 137 mmol/L (ref 135–145)
Total Bilirubin: 0.9 mg/dL (ref 0.3–1.2)
Total Protein: 9.3 g/dL — ABNORMAL HIGH (ref 6.5–8.1)

## 2022-03-19 MED ORDER — HYDROXYZINE HCL 25 MG PO TABS: 25.0000 mg | ORAL_TABLET | Freq: Four times a day (QID) | ORAL | 0 refills | Status: DC

## 2022-03-19 MED ORDER — SODIUM CHLORIDE 0.9 % IV BOLUS
1000.0000 mL | Freq: Once | INTRAVENOUS | Status: AC
  Administered 2022-03-19: 1000 mL via INTRAVENOUS

## 2022-03-19 MED ORDER — ONDANSETRON HCL 4 MG/2ML IJ SOLN
4.0000 mg | Freq: Once | INTRAMUSCULAR | Status: AC
  Administered 2022-03-19: 4 mg via INTRAVENOUS

## 2022-03-19 MED ORDER — ONDANSETRON 8 MG PO TBDP: 8.0000 mg | ORAL_TABLET | Freq: Three times a day (TID) | ORAL | 0 refills | Status: DC | PRN

## 2022-03-19 NOTE — Discharge Instructions (Addendum)
Stop using the [facility].  8 hours as needed for nausea and vomiting.  Use the hydroxyzine, 1/2 to 1 tablet every 6 hours as needed for anxiety.  Follow-up with your primary care provider regarding any new or continued symptoms.

## 2022-03-19 NOTE — ED Triage Notes (Addendum)
Patient presents to UC.   Patient reports that she ate at [employer] Friday and since then she has felt very nausea.  She cant keep anything -- including fluids.   She was getting gas after work today and felt like she was going to faint so that's why she came here.   Patient also reports that she recently started wegovy on Saturday. She was suppose to be taking 0.25 dose but she stated the pharmacy gave her the 1.7 dose and that's what's she took.

## 2022-03-19 NOTE — ED Notes (Signed)
Ambulated to bathroom, 450 cc ivf infused

## 2022-03-19 NOTE — ED Provider Notes (Signed)
[facility]    CSN: [id number] Arrival date & time: 03/19/22  1626      History   Chief Complaint No chief complaint on file.   HPI [name] is a 41 y.o. female.   HPI  41 year old female here for evaluation of GI complaints.  Patient is here because she reports that she has been nauseous and unable to eat or drink all weekend after starting [facility].  She was prescribed the 0.25 mg dose for weight loss and states that the pharmacy gave her the 1.7 mg dose.  She reports that she took her first dose on Saturday when her symptoms started.  She denies any fever, diarrhea, blood in her vomit, or abdominal pain.  She has had 8 episodes of vomiting since Saturday when her symptoms began.  She reports that she was getting gas at the gas [employer] and she started to feel faint so she came over here for evaluation.  She has not had a syncopal event.  Past Medical History:  Diagnosis Date   Abnormal Pap smear 01-06-2010   Asthma    "out grew it" no inhaler   Dysplasia of cervix, low grade (CIN 1)    Generalized anxiety disorder 12/23/2014   H/O cystitis 06-Jan-2001   H/O pyelonephritis 01-06-01   H/O rubella    H/O varicella    History of cesarean delivery 2012-03-27   Jan 07, 2011 for breech    History of chlamydia 12/27/2000   History of induced abortion 03-27-2012   x2    History of PID 03/29/2006   Monilial vaginitis 08/2006   Obese    Pain pelvic 2001/01/06   Unexpected sudden death of infant 2012/03/27   01/07/2011, SIDS at 34mos     Vaginal bleeding in pregnancy Jan 07, 2012    Patient Active Problem List   Diagnosis Date Noted   Prediabetes 09/25/2021   Insomnia 09/25/2021   Hyperlipidemia 08/17/2015   Generalized anxiety disorder 12/23/2014   Morbid obesity (HCC)    Asthma Mar 27, 2012    Past Surgical History:  Procedure Laterality Date   bilateral tubal ligation     CESAREAN SECTION     CESAREAN SECTION  July 22, 2012   Procedure: CESAREAN SECTION;  Surgeon: Kirkland Hun, MD;  Location: [facility];   Service: Obstetrics;  Laterality: N/A;   CYSTOSCOPY  Jul 22, 2012   Procedure: CYSTOSCOPY;  Surgeon: Kirkland Hun, MD;  Location: [facility];  Service: Obstetrics;  Laterality: N/A;   headaches  01-07-2012    during pregnancy    OB History     Gravida  7   Para  4   Term  4   Preterm      AB  3   Living  3      SAB  3   IAB      Ectopic      Multiple      Live Births  3            Home Medications    Prior to Admission medications   Medication Sig Start Date End Date Taking? Authorizing Provider  aspirin EC 81 MG tablet Take 81 mg by mouth daily. Swallow whole.   Yes [provider]  Cholecalciferol (VITAMIN D3 SUPER STRENGTH PO) Take 1 capsule by mouth daily.   Yes [provider]  hydrOXYzine (ATARAX) 25 MG tablet Take 1 tablet (25 mg total) by mouth every 6 (six) hours. 03/19/22  Yes Becky Augusta, NP  Norgestimate-Ethinyl Estradiol Triphasic (TRI-LO-SPRINTEC) 0.18/0.215/0.25  MG-25 MCG tab Take 1 tablet by mouth daily. 10/20/21  Yes Lorre Munroe, NP  ondansetron (ZOFRAN-ODT) 8 MG disintegrating tablet Take 1 tablet (8 mg total) by mouth every 8 (eight) hours as needed for nausea or vomiting. 03/19/22  Yes Becky Augusta, NP  Semaglutide-Weight Management (WEGOVY) 0.25 MG/0.5ML SOAJ Inject 0.25 mg into the skin once a week. 03/16/22  Yes Baity, Salvadore Oxford, NP  Insulin Pen Needle 31G X 5 MM MISC BD Pen Needles- brand specific Inject insulin via insulin pen 6 x daily 09/25/21   Lorre Munroe, NP  phentermine 37.5 MG capsule Take 1 capsule (37.5 mg total) by mouth every morning. 01/04/22   Lorre Munroe, NP    Family History Family History  Problem Relation Age of Onset   Cancer Maternal Grandmother        cervical   Diabetes Paternal Grandmother    Heart disease Maternal Grandfather    Hyperlipidemia Maternal Grandfather    Hypertension Maternal Grandfather    Other Neg Hx     Social History Social History   Tobacco Use   Smoking status: Former    Smokeless tobacco: Never  Building services engineer Use: Never used  Substance Use Topics   Alcohol use: Not Currently    Alcohol/week: 0.0 standard drinks of alcohol   Drug use: Never     Allergies   Patient has no known allergies.   Review of Systems Review of Systems  Constitutional:  Negative for fever.  Gastrointestinal:  Positive for nausea and vomiting. Negative for abdominal pain and diarrhea.  Neurological:  Negative for syncope.  Hematological: Negative.   Psychiatric/Behavioral: Negative.       Physical Exam Triage Vital Signs ED Triage Vitals  Enc Vitals Group     BP 03/19/22 1640 135/89     Pulse Rate 03/19/22 1640 85     Resp --      Temp 03/19/22 1640 98.4 F (36.9 C)     Temp Source 03/19/22 1640 Oral     SpO2 03/19/22 1640 100 %     Weight 03/19/22 1637 250 lb (113.4 kg)     Height 03/19/22 1637 5\' 4"  (1.626 m)     Head Circumference --      Peak Flow --      Pain Score 03/19/22 1636 0     Pain Loc --      Pain Edu? --      Excl. in GC? --    No data found.  Updated Vital Signs BP 135/89 (BP Location: Left Arm)   Pulse 85   Temp 98.4 F (36.9 C) (Oral)   Ht 5\' 4"  (1.626 m)   Wt 250 lb (113.4 kg)   LMP 03/12/2022 (Approximate)   SpO2 100%   BMI 42.91 kg/m   Visual Acuity Right Eye Distance:   Left Eye Distance:   Bilateral Distance:    Right Eye Near:   Left Eye Near:    Bilateral Near:     Physical Exam Vitals and nursing note reviewed.  Constitutional:      Appearance: Normal appearance. She is not ill-appearing.  HENT:     Head: Normocephalic and atraumatic.  Cardiovascular:     Rate and Rhythm: Normal rate and regular rhythm.     Pulses: Normal pulses.     Heart sounds: Normal heart sounds. No murmur heard.    No friction rub. No gallop.  Pulmonary:     Effort: Pulmonary  effort is normal.     Breath sounds: Normal breath sounds. No wheezing, rhonchi or rales.  Abdominal:     General: Abdomen is flat.     Palpations:  Abdomen is soft.     Tenderness: There is no abdominal tenderness. There is no guarding or rebound.  Skin:    General: Skin is warm and dry.     Capillary Refill: Capillary refill takes less than 2 seconds.     Findings: No erythema or rash.  Neurological:     General: No focal deficit present.     Mental Status: She is alert and oriented to person, place, and time.  Psychiatric:        Mood and Affect: Mood normal.        Thought Content: Thought content normal.        Judgment: Judgment normal.      UC Treatments / Results  Labs (all labs ordered are listed, but only abnormal results are displayed) Labs Reviewed  COMPREHENSIVE METABOLIC PANEL - Abnormal; Notable for the following components:      Result Value   BUN 28 (*)    Creatinine, Ser 1.17 (*)    Total Protein 9.3 (*)    All other components within normal limits  CBC WITH DIFFERENTIAL/PLATELET    EKG   Radiology No results found.  Procedures Procedures (including critical care time)  Medications Ordered in UC Medications  sodium chloride 0.9 % bolus 1,000 mL (1,000 mLs Intravenous New Bag/Given 03/19/22 1731)  ondansetron (ZOFRAN) injection 4 mg (4 mg Intravenous Given 03/19/22 1731)    Initial Impression / Assessment and Plan / UC Course  I have reviewed the triage vital signs and the nursing notes.  Pertinent labs & imaging results that were available during my care of the patient were reviewed by me and considered in my medical decision making (see chart for details).  Patient is a pleasant, nontoxic-appearing 41 year old female here for evaluation of nausea, vomiting, and feeling faint.  Patient reports that she had nausea and vomiting all weekend since she took her first dose of Wegovy for weight loss.  As mentioned in the HPI above, she was prescribed the 0.25 mg and was given the 1.7 mg by pharmacy.  She states that she took the dose and then the nausea and vomiting began.  She was on her way home from  work and while getting gas she began to feel faint so she came over here for evaluation.  She has not had a syncopal event.  She does continue to have nausea and vomiting and has had approximately 8 episodes of vomiting in the past 3 days.  No blood in her emesis.  No fever.  Physical exam reveals a benign cardiopulmonary exam with S1-S2 heart sounds with regular rate and rhythm and lung sounds are clear to auscultation all fields.  Abdomen is soft and nontender.  Suspect that patient's nausea and vomiting is secondary to the high-dose of Wegovy that she began.  I have encouraged her to stop taking and contact her PCP in the morning.  I will check CBC, CMP, give 1 L of normal saline IV and 4 of Zofran IV to help with the nausea and vomiting.  CBC is unremarkable.  CMP does not show any electrolyte abnormality or transaminase elevation.  Her BUN and creatinine are both elevated at 28 and 1.17 respectively.  The next most recent CMP we have on record is from 08/03/2020 which shows normal renal  function with a BUN of 9 and a creatinine of 0.90.  I suspect that the elevated BUN and creatinine are secondary to the nausea, vomiting, and 2 days worth of poor p.o. intake.  I will continue the IV fluid hydration and reassess patient.  I advised the patient that her blood work looks good and there is no signs of systemic infection or electrolyte imbalance.  Her renal function is a little bit elevated but that is most likely secondary to the fact that she has not had anything to eat or drink over the past 2 days.  She reports that she is feeling very anxious and her anxiety is level is up.  She is requesting something for anxiety.  I have advised her that I do not have anything in the clinical I can give her for her anxiety but I can discharge her home with some hydroxyzine that she can use for anxiety.  I will also send her home with some ODT tablets to use for nausea.   Final Clinical Impressions(s) / UC Diagnoses    Final diagnoses:  Adverse effect of drug, initial encounter  Anxiety state     Discharge Instructions      Stop using the [facility].  8 hours as needed for nausea and vomiting.  Use the hydroxyzine, 1/2 to 1 tablet every 6 hours as needed for anxiety.  Follow-up with your primary care provider regarding any new or continued symptoms.     ED Prescriptions     Medication Sig Dispense Auth. Provider   ondansetron (ZOFRAN-ODT) 8 MG disintegrating tablet Take 1 tablet (8 mg total) by mouth every 8 (eight) hours as needed for nausea or vomiting. 20 tablet Becky Augusta, NP   hydrOXYzine (ATARAX) 25 MG tablet Take 1 tablet (25 mg total) by mouth every 6 (six) hours. 25 tablet Becky Augusta, NP      PDMP not reviewed this encounter.   Becky Augusta, NP 03/19/22 1849

## 2022-03-20 ENCOUNTER — Emergency Department (HOSPITAL_COMMUNITY)
Admission: EM | Admit: 2022-03-20 | Discharge: 2022-03-21 | Disposition: A | Discharge status: Home / Self Care | Payer: 59 | Attending: Emergency Medicine | Admitting: Emergency Medicine

## 2022-03-20 ENCOUNTER — Emergency Department (HOSPITAL_COMMUNITY): Payer: 59

## 2022-03-20 ENCOUNTER — Other Ambulatory Visit: Payer: Self-pay

## 2022-03-20 ENCOUNTER — Encounter (HOSPITAL_COMMUNITY): Payer: Self-pay

## 2022-03-20 DIAGNOSIS — R1012 Left upper quadrant pain: Secondary | ICD-10-CM | POA: Insufficient documentation

## 2022-03-20 DIAGNOSIS — Z79899 Other long term (current) drug therapy: Secondary | ICD-10-CM | POA: Diagnosis not present

## 2022-03-20 DIAGNOSIS — R000 Tachycardia, unspecified: Secondary | ICD-10-CM | POA: Insufficient documentation

## 2022-03-20 DIAGNOSIS — R1110 Vomiting, unspecified: Secondary | ICD-10-CM | POA: Insufficient documentation

## 2022-03-20 DIAGNOSIS — Z794 Long term (current) use of insulin: Secondary | ICD-10-CM | POA: Insufficient documentation

## 2022-03-20 DIAGNOSIS — R079 Chest pain, unspecified: Secondary | ICD-10-CM | POA: Insufficient documentation

## 2022-03-20 DIAGNOSIS — Z7982 Long term (current) use of aspirin: Secondary | ICD-10-CM | POA: Diagnosis not present

## 2022-03-20 LAB — URINALYSIS, MICROSCOPIC (REFLEX)

## 2022-03-20 LAB — URINALYSIS, ROUTINE W REFLEX MICROSCOPIC
Glucose, UA: NEGATIVE mg/dL
Ketones, ur: >80 mg/dL — AB
Leukocytes,Ua: NEGATIVE
Nitrite: NEGATIVE
Protein, ur: 100 mg/dL — AB
Specific Gravity, Urine: >1.03 — ABNORMAL HIGH (ref 1.005–1.030)
pH: 6 (ref 5.0–8.0)

## 2022-03-20 LAB — CBC
HCT: 40 % (ref 36.0–46.0)
Hemoglobin: 13 g/dL (ref 12.0–15.0)
MCH: 28 pg (ref 26.0–34.0)
MCHC: 32.5 g/dL (ref 30.0–36.0)
MCV: 86 fL (ref 80.0–100.0)
Platelets: 314 10*3/uL (ref 150–400)
RBC: 4.65 MIL/uL (ref 3.87–5.11)
RDW: 13.5 % (ref 11.5–15.5)
WBC: 8.5 10*3/uL (ref 4.0–10.5)
nRBC: 0 % (ref 0.0–0.2)

## 2022-03-20 LAB — TROPONIN I (HIGH SENSITIVITY)
Troponin I (High Sensitivity): 4 ng/L (ref <18)
Troponin I (High Sensitivity): 4 ng/L (ref <18)

## 2022-03-20 LAB — BASIC METABOLIC PANEL
Anion gap: 15 (ref 5–15)
BUN: 19 mg/dL (ref 6–20)
CO2: 17 mmol/L — ABNORMAL LOW (ref 22–32)
Calcium: 9.4 mg/dL (ref 8.9–10.3)
Chloride: 106 mmol/L (ref 98–111)
Creatinine, Ser: 0.94 mg/dL (ref 0.44–1.00)
GFR, Estimated: >60 mL/min (ref >60)
Glucose, Bld: 98 mg/dL (ref 70–99)
Potassium: 4 mmol/L (ref 3.5–5.1)
Sodium: 138 mmol/L (ref 135–145)

## 2022-03-20 LAB — HEPATIC FUNCTION PANEL
ALT: 23 U/L (ref 0–44)
AST: 37 U/L (ref 15–41)
Albumin: 3.8 g/dL (ref 3.5–5.0)
Alkaline Phosphatase: 79 U/L (ref 38–126)
Bilirubin, Direct: 0.5 mg/dL — ABNORMAL HIGH (ref 0.0–0.2)
Indirect Bilirubin: 0.7 mg/dL (ref 0.3–0.9)
Total Bilirubin: 1.2 mg/dL (ref 0.3–1.2)
Total Protein: 8.7 g/dL — ABNORMAL HIGH (ref 6.5–8.1)

## 2022-03-20 LAB — RAPID URINE DRUG SCREEN, HOSP PERFORMED
Amphetamines: NOT DETECTED
Barbiturates: NOT DETECTED
Benzodiazepines: NOT DETECTED
Cocaine: NOT DETECTED
Opiates: NOT DETECTED
Tetrahydrocannabinol: NOT DETECTED

## 2022-03-20 LAB — LIPASE, BLOOD: Lipase: 61 U/L — ABNORMAL HIGH (ref 11–51)

## 2022-03-20 LAB — I-STAT BETA HCG BLOOD, ED (MC, WL, AP ONLY): I-stat hCG, quantitative: <5 m[IU]/mL (ref <5)

## 2022-03-20 LAB — LACTIC ACID, PLASMA: Lactic Acid, Venous: 1.4 mmol/L (ref 0.5–1.9)

## 2022-03-20 MED ORDER — SODIUM CHLORIDE 0.9 % IV BOLUS
1000.0000 mL | Freq: Once | INTRAVENOUS | Status: AC
  Administered 2022-03-20: 1000 mL via INTRAVENOUS

## 2022-03-20 MED ORDER — IOHEXOL 350 MG/ML SOLN
90.0000 mL | Freq: Once | INTRAVENOUS | Status: AC | PRN
  Administered 2022-03-20: 90 mL via INTRAVENOUS

## 2022-03-20 MED ORDER — HYDROMORPHONE HCL 1 MG/ML IJ SOLN
1.0000 mg | Freq: Once | INTRAMUSCULAR | Status: AC
  Administered 2022-03-20: 1 mg via INTRAVENOUS
  Filled 2022-03-20: qty 1

## 2022-03-20 MED ORDER — HALOPERIDOL LACTATE 5 MG/ML IJ SOLN
5.0000 mg | Freq: Once | INTRAMUSCULAR | Status: AC
  Administered 2022-03-20: 5 mg via INTRAVENOUS
  Filled 2022-03-20: qty 1

## 2022-03-20 MED ORDER — METOCLOPRAMIDE HCL 5 MG/ML IJ SOLN
10.0000 mg | Freq: Once | INTRAMUSCULAR | Status: AC
Start: 2022-03-20 — End: 2022-03-20
  Administered 2022-03-20: 10 mg via INTRAVENOUS
  Filled 2022-03-20: qty 2

## 2022-03-20 NOTE — ED Triage Notes (Signed)
PER EMS: pt reports left sided chest pain that radiates to left arm, as well as left sided abd pain, nausea, vomiting, onset Sunday. LBM x 4 days ago.  324 asa given en route but vomited it up. Did not give zofran due to inability to get IV. Pt is moaning and restless.  BP- 134/98, HR-110, 98% RA, CBG-85

## 2022-03-20 NOTE — ED Provider Notes (Signed)
[facility] EMERGENCY DEPARTMENT Provider Note   CSN: [id number] Arrival date & time: 03/20/22  1800     History  Chief Complaint  Patient presents with   Chest Pain    [name] is a 41 y.o. female.  HPI 41 year old female presents with abdominal pain and chest pain.  History is primarily from the daughter as the patient is having too much pain to speak to me much.  Developed abdominal pain over the last several days its in the left upper quadrant.  Multiple episodes of vomiting.  Started getting chest pain and worsening symptoms today after starting a new medicine, which family is not sure what it was but it seems to be treating anxiety (hydroxyzine?).  Is now having left-sided chest pain.  No urinary symptoms, back pain, diarrhea.  Home Medications Prior to Admission medications   Medication Sig Start Date End Date Taking? Authorizing Provider  aspirin EC 81 MG tablet Take 81 mg by mouth daily. Swallow whole.    [provider]  Cholecalciferol (VITAMIN D3 SUPER STRENGTH PO) Take 1 capsule by mouth daily.    [provider]  hydrOXYzine (ATARAX) 25 MG tablet Take 1 tablet (25 mg total) by mouth every 6 (six) hours. 03/19/22   Becky Augusta, NP  Insulin Pen Needle 31G X 5 MM MISC BD Pen Needles- brand specific Inject insulin via insulin pen 6 x daily 09/25/21   Lorre Munroe, NP  Norgestimate-Ethinyl Estradiol Triphasic (TRI-LO-SPRINTEC) 0.18/0.215/0.25 MG-25 MCG tab Take 1 tablet by mouth daily. 10/20/21   Lorre Munroe, NP  ondansetron (ZOFRAN-ODT) 8 MG disintegrating tablet Take 1 tablet (8 mg total) by mouth every 8 (eight) hours as needed for nausea or vomiting. 03/19/22   Becky Augusta, NP  phentermine 37.5 MG capsule Take 1 capsule (37.5 mg total) by mouth every morning. 01/04/22   Lorre Munroe, NP  Semaglutide-Weight Management (WEGOVY) 0.25 MG/0.5ML SOAJ Inject 0.25 mg into the skin once a week. 03/16/22   Lorre Munroe, NP       Allergies    Patient has no known allergies.    Review of Systems   Review of Systems  Constitutional:  Negative for fever.  Cardiovascular:  Positive for chest pain.  Gastrointestinal:  Positive for abdominal pain and vomiting. Negative for diarrhea.  Genitourinary:  Negative for dysuria.  Musculoskeletal:  Negative for back pain.    Physical Exam Updated Vital Signs BP 135/86   Pulse (!) 107   Temp 98.8 F (37.1 C) (Oral)   Resp 19   Ht 5\' 4"  (1.626 m)   Wt 113.4 kg   LMP 03/12/2022 (Approximate)   SpO2 99%   BMI 42.91 kg/m  Physical Exam Vitals and nursing note reviewed.  Constitutional:      General: She is in acute distress.     Appearance: She is well-developed.     Comments: Patient is frequently moving in bed and changing positions.  HENT:     Head: Normocephalic and atraumatic.  Cardiovascular:     Rate and Rhythm: Regular rhythm. Tachycardia present.     Heart sounds: Normal heart sounds.  Pulmonary:     Effort: Pulmonary effort is normal.     Breath sounds: Normal breath sounds.  Abdominal:     Palpations: Abdomen is soft.     Tenderness: There is abdominal tenderness in the left upper quadrant. There is no guarding or rebound.  Skin:    General: Skin is warm  and dry.  Neurological:     Mental Status: She is alert.     ED Results / Procedures / Treatments   Labs (all labs ordered are listed, but only abnormal results are displayed) Labs Reviewed  BASIC METABOLIC PANEL - Abnormal; Notable for the following components:      Result Value   CO2 17 (*)    All other components within normal limits  LIPASE, BLOOD - Abnormal; Notable for the following components:   Lipase 61 (*)    All other components within normal limits  CBC  URINALYSIS, ROUTINE W REFLEX MICROSCOPIC  HEPATIC FUNCTION PANEL  I-STAT BETA HCG BLOOD, ED ([facility], [facility], AP ONLY)  TROPONIN I (HIGH SENSITIVITY)  TROPONIN I (HIGH SENSITIVITY)    EKG EKG  Interpretation  Date/Time:  Tuesday March 20 2022 18:08:49 EDT Ventricular Rate:  94 PR Interval:  184 QRS Duration: 72 QT Interval:  358 QTC Calculation: 447 R Axis:   43 Text Interpretation: Normal sinus rhythm with sinus arrhythmia no acute ST/T changes wander in v3 limits interpretation Confirmed by Pricilla Loveless [phone]) on 03/20/2022 8:35:37 PM  Radiology DG Chest 2 View  Result Date: 03/20/2022 CLINICAL DATA:  Left-sided chest pain extending into the upper extremity. EXAM: CHEST - 2 VIEW COMPARISON:  Two-view chest x-ray 04/22/2013 FINDINGS: The heart size and mediastinal contours are within normal limits. Both lungs are clear. The visualized skeletal structures are unremarkable. IMPRESSION: No active cardiopulmonary disease. Electronically Signed   By: Marin Roberts M.D.   On: 03/20/2022 20:39    Procedures Procedures    Medications Ordered in ED Medications  HYDROmorphone (DILAUDID) injection 1 mg (has no administration in time range)  metoCLOPramide (REGLAN) injection 10 mg (has no administration in time range)  sodium chloride 0.9 % bolus 1,000 mL (has no administration in time range)    ED Course/ Medical Decision Making/ A&P                           Medical Decision Making Amount and/or Complexity of Data Reviewed Labs: ordered.    Details: Mildly low bicarbonate but normal anion gap.  Normal renal function and LFTs.  Troponins normal.  Urinalysis shows ketones which goes along with dehydration but no UTI. Radiology: ordered and independent interpretation performed.    Details: Chest x-ray without pneumonia.  CT without obstruction or pancreatitis ECG/medicine tests: ordered and independent interpretation performed.    Details: No acute ischemia.  Risk Prescription drug management.   Unclear cause of her significant abdominal pain.  Lactate is normal.  Other labs are reassuring.  Troponins negative.  She was given Dilaudid and Reglan with some partial  relief but now her symptoms have come back.  Will be given IV Haldol to see if this helps relieve her symptoms.  Otherwise, CT and other work-up is reassuring.  Care transferred to Dr. Pilar Plate.        Final Clinical Impression(s) / ED Diagnoses Final diagnoses:  None    Rx / DC Orders ED Discharge Orders     None         Pricilla Loveless, MD 03/20/22 2351

## 2022-03-20 NOTE — ED Notes (Signed)
Unable to obtain XR due to inability to lay still.

## 2022-03-20 NOTE — ED Notes (Signed)
Patient transported to X-ray 

## 2022-03-20 NOTE — ED Provider Notes (Signed)
  Provider Note MRN:  [id number]  Arrival date & time: 03/21/22    ED Course and Medical Decision Making  Assumed care from Dr. Criss Alvine at shift change.  LUQ pain, suspect gastroparesis or GERD, reassuring workup will need reassessment after haldol.  On reassessment patient is feeling much better, resting comfortably, benign abdomen.  Normal vital signs.  Work-up is reassuring, appropriate for discharge.  Procedures  Final Clinical Impressions(s) / ED Diagnoses     ICD-10-CM   1. LUQ abdominal pain  R10.12       ED Discharge Orders          Ordered    ondansetron (ZOFRAN-ODT) 4 MG disintegrating tablet  Every 8 hours PRN        03/21/22 0121    dicyclomine (BENTYL) 20 MG tablet  2 times daily        03/21/22 0121    omeprazole (PRILOSEC) 20 MG capsule  Daily        03/21/22 0121              Discharge Instructions      You were evaluated in the Emergency Department and after careful evaluation, we did not find any emergent condition requiring admission or further testing in the hospital.  Your exam/testing today was overall reassuring.  Symptoms may be due to acid reflux.  Take the omeprazole daily to prevent discomfort.  Use the Zofran for nausea as needed, use the Bentyl as needed for crampy abdominal pain.  Please return to the Emergency Department if you experience any worsening of your condition.  Thank you for allowing [place] to be a part of your care.       Elmer Sow. Pilar Plate, MD [facility] Emergency Medicine [facility] [email or web address]    Sabas Sous, MD 03/21/22 [phone]

## 2022-03-20 NOTE — ED Notes (Signed)
Patient transported to CT 

## 2022-03-21 MED ORDER — OMEPRAZOLE 20 MG PO CPDR: 20.0000 mg | DELAYED_RELEASE_CAPSULE | Freq: Every day | ORAL | 1 refills | Status: DC

## 2022-03-21 MED ORDER — DICYCLOMINE HCL 20 MG PO TABS: 20.0000 mg | ORAL_TABLET | Freq: Two times a day (BID) | ORAL | 0 refills | Status: DC

## 2022-03-21 MED ORDER — ONDANSETRON 4 MG PO TBDP: 4.0000 mg | ORAL_TABLET | Freq: Three times a day (TID) | ORAL | 0 refills | Status: DC | PRN

## 2022-03-21 NOTE — Discharge Instructions (Signed)
You were evaluated in the Emergency Department and after careful evaluation, we did not find any emergent condition requiring admission or further testing in the hospital.  Your exam/testing today was overall reassuring.  Symptoms may be due to acid reflux.  Take the omeprazole daily to prevent discomfort.  Use the Zofran for nausea as needed, use the Bentyl as needed for crampy abdominal pain.  Please return to the Emergency Department if you experience any worsening of your condition.  Thank you for allowing [place] to be a part of your care.

## 2022-03-24 ENCOUNTER — Other Ambulatory Visit: Payer: Self-pay

## 2022-03-24 ENCOUNTER — Ambulatory Visit (HOSPITAL_COMMUNITY)
Admission: EM | Admit: 2022-03-24 | Discharge: 2022-03-26 | Disposition: A | Discharge status: Other Acute Inpatient Hospital | Payer: 59 | Attending: Urology | Admitting: Urology

## 2022-03-24 DIAGNOSIS — R45851 Suicidal ideations: Secondary | ICD-10-CM | POA: Diagnosis not present

## 2022-03-24 DIAGNOSIS — F332 Major depressive disorder, recurrent severe without psychotic features: Secondary | ICD-10-CM | POA: Diagnosis present

## 2022-03-24 DIAGNOSIS — F333 Major depressive disorder, recurrent, severe with psychotic symptoms: Secondary | ICD-10-CM | POA: Insufficient documentation

## 2022-03-24 DIAGNOSIS — Z20822 Contact with and (suspected) exposure to covid-19: Secondary | ICD-10-CM | POA: Diagnosis not present

## 2022-03-24 LAB — CBC WITH DIFFERENTIAL/PLATELET
Abs Immature Granulocytes: 0.05 10*3/uL (ref 0.00–0.07)
Basophils Absolute: 0 10*3/uL (ref 0.0–0.1)
Basophils Relative: 0 %
Eosinophils Absolute: 0.1 10*3/uL (ref 0.0–0.5)
Eosinophils Relative: 0 %
HCT: 42.3 % (ref 36.0–46.0)
Hemoglobin: 14 g/dL (ref 12.0–15.0)
Immature Granulocytes: 0 %
Lymphocytes Relative: 32 %
Lymphs Abs: 4.7 10*3/uL — ABNORMAL HIGH (ref 0.7–4.0)
MCH: 28.2 pg (ref 26.0–34.0)
MCHC: 33.1 g/dL (ref 30.0–36.0)
MCV: 85.3 fL (ref 80.0–100.0)
Monocytes Absolute: 0.8 10*3/uL (ref 0.1–1.0)
Monocytes Relative: 6 %
Neutro Abs: 9 10*3/uL — ABNORMAL HIGH (ref 1.7–7.7)
Neutrophils Relative %: 62 %
Platelets: 325 10*3/uL (ref 150–400)
RBC: 4.96 MIL/uL (ref 3.87–5.11)
RDW: 13.3 % (ref 11.5–15.5)
WBC: 14.7 10*3/uL — ABNORMAL HIGH (ref 4.0–10.5)
nRBC: 0 % (ref 0.0–0.2)

## 2022-03-24 LAB — COMPREHENSIVE METABOLIC PANEL
ALT: 61 U/L — ABNORMAL HIGH (ref 0–44)
AST: 71 U/L — ABNORMAL HIGH (ref 15–41)
Albumin: 3.8 g/dL (ref 3.5–5.0)
Alkaline Phosphatase: 78 U/L (ref 38–126)
Anion gap: 17 — ABNORMAL HIGH (ref 5–15)
BUN: 20 mg/dL (ref 6–20)
CO2: 20 mmol/L — ABNORMAL LOW (ref 22–32)
Calcium: 9.5 mg/dL (ref 8.9–10.3)
Chloride: 98 mmol/L (ref 98–111)
Creatinine, Ser: 0.94 mg/dL (ref 0.44–1.00)
GFR, Estimated: >60 mL/min (ref >60)
Glucose, Bld: 78 mg/dL (ref 70–99)
Potassium: 3.6 mmol/L (ref 3.5–5.1)
Sodium: 135 mmol/L (ref 135–145)
Total Bilirubin: 1.1 mg/dL (ref 0.3–1.2)
Total Protein: 8.9 g/dL — ABNORMAL HIGH (ref 6.5–8.1)

## 2022-03-24 LAB — RESP PANEL BY RT-PCR (FLU A&B, COVID) ARPGX2
Influenza A by PCR: NEGATIVE
Influenza B by PCR: NEGATIVE
SARS Coronavirus 2 by RT PCR: NEGATIVE

## 2022-03-24 LAB — LIPID PANEL
Cholesterol: 297 mg/dL — ABNORMAL HIGH (ref 0–200)
HDL: 66 mg/dL (ref >40)
LDL Cholesterol: 208 mg/dL — ABNORMAL HIGH (ref 0–99)
Total CHOL/HDL Ratio: 4.5 RATIO
Triglycerides: 116 mg/dL (ref <150)
VLDL: 23 mg/dL (ref 0–40)

## 2022-03-24 LAB — TSH: TSH: 2.107 u[IU]/mL (ref 0.350–4.500)

## 2022-03-24 LAB — HEMOGLOBIN A1C
Hgb A1c MFr Bld: 5.2 % (ref 4.8–5.6)
Mean Plasma Glucose: 102.54 mg/dL

## 2022-03-24 MED ORDER — HYDROXYZINE HCL 25 MG PO TABS
25.0000 mg | ORAL_TABLET | Freq: Three times a day (TID) | ORAL | Status: DC | PRN
  Administered 2022-03-24 – 2022-03-25 (×3): 25 mg via ORAL
  Filled 2022-03-24 (×3): qty 1

## 2022-03-24 MED ORDER — ALUM & MAG HYDROXIDE-SIMETH 200-200-20 MG/5ML PO SUSP: 30.0000 mL | ORAL | Status: DC | PRN

## 2022-03-24 MED ORDER — LORAZEPAM 1 MG PO TABS
1.0000 mg | ORAL_TABLET | Freq: Once | ORAL | Status: AC
  Administered 2022-03-24: 1 mg via SUBLINGUAL
  Filled 2022-03-24: qty 1

## 2022-03-24 MED ORDER — OLANZAPINE 5 MG PO TBDP
5.0000 mg | ORAL_TABLET | Freq: Once | ORAL | Status: AC
  Administered 2022-03-24: 5 mg via ORAL
  Filled 2022-03-24: qty 1

## 2022-03-24 MED ORDER — ACETAMINOPHEN 325 MG PO TABS: 650.0000 mg | ORAL_TABLET | Freq: Four times a day (QID) | ORAL | Status: DC | PRN

## 2022-03-24 MED ORDER — PANTOPRAZOLE SODIUM 40 MG PO TBEC
40.0000 mg | DELAYED_RELEASE_TABLET | Freq: Every day | ORAL | Status: DC
Start: 2022-03-24 — End: 2022-03-26
  Administered 2022-03-24 – 2022-03-26 (×3): 40 mg via ORAL
  Filled 2022-03-24 (×3): qty 1

## 2022-03-24 MED ORDER — MAGNESIUM HYDROXIDE 400 MG/5ML PO SUSP: 30.0000 mL | Freq: Every day | ORAL | Status: DC | PRN

## 2022-03-24 MED ORDER — ONDANSETRON 4 MG PO TBDP: 4.0000 mg | ORAL_TABLET | Freq: Three times a day (TID) | ORAL | Status: DC | PRN

## 2022-03-24 MED ORDER — LORAZEPAM 1 MG PO TABS
1.0000 mg | ORAL_TABLET | Freq: Once | ORAL | Status: AC
  Administered 2022-03-24: 1 mg via ORAL
  Filled 2022-03-24: qty 1

## 2022-03-24 MED ORDER — DICYCLOMINE HCL 20 MG PO TABS
20.0000 mg | ORAL_TABLET | Freq: Two times a day (BID) | ORAL | Status: DC
  Administered 2022-03-24 – 2022-03-26 (×5): 20 mg via ORAL
  Filled 2022-03-24 (×5): qty 1

## 2022-03-24 MED ORDER — TRAZODONE HCL 50 MG PO TABS
50.0000 mg | ORAL_TABLET | Freq: Every evening | ORAL | Status: DC | PRN
  Administered 2022-03-25: 50 mg via ORAL
  Filled 2022-03-24: qty 1

## 2022-03-24 MED ORDER — LORAZEPAM 1 MG PO TABS: 1.0000 mg | ORAL_TABLET | Freq: Once | ORAL | Status: DC

## 2022-03-24 NOTE — ED Notes (Signed)
Provider notified of LUQ pain and BP

## 2022-03-24 NOTE — Discharge Instructions (Addendum)
You are being transferred for inpatient admission.

## 2022-03-24 NOTE — ED Notes (Signed)
Patient is laying in bed resting. Pt was given Ativan 1mg  earlier this morning but after an hour, she stated it did not help her anxiety. Atarax 25mg  given at 0756. Pt is still noted to be moaning and crying out. She denies any pain and states its just her anxiety. Advised pt to try to get some rest and give the Atarax time to work. Denies current SI/HI. Denies AVH. Provider aware.

## 2022-03-24 NOTE — ED Notes (Signed)
Attempted to call report again to [facility] x3 with no success.

## 2022-03-24 NOTE — BH Assessment (Signed)
Comprehensive Clinical Assessment (CCA) Note  03/24/2022 [name] [id number]  DISPOSITION: Completed CCA accompanied by Cecilio Asper, NP who completed MSE and determined Pt meets criteria for inpatient psychiatric treatment. [facility] does not have an available bed and other facilities will be contacted for placement.  The patient demonstrates the following risk factors for suicide: Chronic risk factors for suicide include: psychiatric disorder of major depressive disorder . Acute risk factors for suicide include:  illness . Protective factors for this patient include: positive social support and responsibility to others (children, family). Considering these factors, the overall suicide risk at this point appears to be moderate. Patient is not appropriate for outpatient follow up.  Pt is a 41 year old female who presents to [facility] accompanied by her mother, Davelyn Gwinn [phone], who participated in assessment at Pt's request. Pt states she has not eaten food in the past week, that she feel nauseated and has abdominal pain. She reports very little fluid intake over the past three days. She also reports over the past week she has been unable to remain still. Pt has sores on her elbows and knees from moving around on her bed. She says she has little to no sleep over the past several days. She reports suicidal ideation, stating she has thought of waiting until her children are asleep and cutting her wrists. She says they would be better off without her. She denies any history of suicide attempts. She describes here mood as extremely anxious and depressed. Pt acknowledges symptoms including crying spells, social withdrawal, loss of interest in usual pleasures, fatigue, irritability, decreased concentration, decreased sleep, decreased appetite and feelings of guilt, worthlessness and hopelessness. She denies current homicidal ideation or history of violence. She denies current auditory or visual  hallucinations but says she recently thought she saw a baby that was not actually there. Pt's mother states Pt lost a baby to crib death approximately 6 years ago. Pt denies alcohol or other substance use.  Pt identifies her physical and mental health symptoms as her primary stressor. She was evaluated and medically cleared at [facility] 03/20/2022. Per Cecilio Asper, NP, Pt has been taking a higher dose of Wegovy than recommended. Pt lives with 103 year old daughter, 47 year old son, and 36-year-old son. 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 She denies history of abuse. She denies legal problems. She denies access to firearms.  Pt says she has no mental health providers. Pt's mother says Pt has had mental health episodes similar to this years ago. Pt was psychiatrically hospitalized in 2007 at [facility] for depressive symptoms.   Pt is casually dressed, alert and oriented x4. Pt speaks in a clear tone, at moderate volume and normal pace. Motor behavior appears very restless and Pt is unable to sit still. Eye contact is good and Pt is tearful at times. Pt's mood is depressed and anxious, affect is congruent with mood. Thought process is coherent and relevant. There is no indication Pt is currently responding to internal stimuli or experiencing delusional thought content. She is cooperative and willing to sign voluntarily into a psychiatric facility.   Chief Complaint: No chief complaint on file.  Visit Diagnosis: F33.2 Major depressive disorder, Recurrent episode, Severe   CCA Screening, Triage and Referral (STR)  Patient Reported Information How did you hear about [place]? Family/Friend  What Is the Reason for Your Visit/Call Today? Pt states she cannot sit still and "cannot control  my nerves." She says she has not eaten food in a week, has not been drinking fluids, and is experiencing nausea and abdominal pain. She reports suicidal  ideation with plan to cut her wrist after her children go to sleep, staying they will be better off without her.  How Long Has This Been Causing You Problems? <Week  What Do You Feel Would Help You the Most Today? Treatment for Depression or other mood problem; Medication(s)   Have You Recently Had Any Thoughts About Hurting Yourself? Yes  Are You Planning to Commit Suicide/Harm Yourself At This time? Yes   Have you Recently Had Thoughts About Hurting Someone Karolee Ohs? No  Are You Planning to Harm Someone at This Time? No  Explanation: No data recorded  Have You Used Any Alcohol or Drugs in the Past 24 Hours? No  How Long Ago Did You Use Drugs or Alcohol? No data recorded What Did You Use and How Much? No data recorded  Do You Currently Have a Therapist/Psychiatrist? No  Name of Therapist/Psychiatrist: No data recorded  Have You Been Recently Discharged From Any Office Practice or Programs? No  Explanation of Discharge From Practice/Program: No data recorded    CCA Screening Triage Referral Assessment Type of Contact: Face-to-Face  Telemedicine Service Delivery:   Is this Initial or Reassessment? No data recorded Date Telepsych consult ordered in [facility]:  No data recorded Time Telepsych consult ordered in [facility]:  No data recorded Location of Assessment: [facility] Services  Provider Location: [facility] Assessment Services   Collateral Involvement: Pt's mother: Lilliona Blakeney [phone]   Does Patient Have a Court Appointed Legal Guardian? No data recorded Name and Contact of Legal Guardian: No data recorded If Minor and Not Living with Parent(s), Who has Custody? NA  Is CPS involved or ever been involved? Never  Is APS involved or ever been involved? Never   Patient Determined To Be At Risk for Harm To Self or Others Based on Review of Patient Reported Information or Presenting Complaint? Yes, for Self-Harm  Method: No data recorded Availability of Means: No data  recorded Intent: No data recorded Notification Required: No data recorded Additional Information for Danger to Others Potential: No data recorded Additional Comments for Danger to Others Potential: No data recorded Are There Guns or Other Weapons in Your Home? No data recorded Types of Guns/Weapons: No data recorded Are These Weapons Safely Secured?                            No data recorded Who Could Verify You Are Able To Have These Secured: No data recorded Do You Have any Outstanding Charges, Pending Court Dates, Parole/Probation? No data recorded Contacted To Inform of Risk of Harm To Self or Others: Family/Significant Other:    Does Patient Present under Involuntary Commitment? No  IVC Papers Initial File Date: No data recorded  [place] of Residence: [place]   Patient Currently Receiving the Following Services: Not Receiving Services   Determination of Need: Urgent (48 hours)   Options For Referral: Inpatient Hospitalization     CCA Biopsychosocial Patient Reported Schizophrenia/Schizoaffective Diagnosis in Past: No   Strengths: Pt is motivated for treatment and has family support   Mental Health Symptoms Depression:   Change in energy/activity; Difficulty Concentrating; Fatigue; Hopelessness; Irritability; Sleep (too much or little); Tearfulness; Weight gain/loss; Worthlessness   Duration of Depressive symptoms:  Duration of Depressive Symptoms: Less than two weeks  Mania:   None   Anxiety:    Difficulty concentrating; Fatigue; Irritability; Restlessness; Sleep; Worrying; Tension   Psychosis:   None   Duration of Psychotic symptoms:    Trauma:   None   Obsessions:   None   Compulsions:   None   Inattention:   N/A   Hyperactivity/Impulsivity:   N/A   Oppositional/Defiant Behaviors:   N/A   Emotional Irregularity:   None   Other Mood/Personality Symptoms:   None    Mental Status Exam Appearance and self-care  Stature:    Average   Weight:   Obese   Clothing:   Casual   Grooming:   Normal   Cosmetic use:   Age appropriate   Posture/gait:   Tense   Motor activity:   Restless   Sensorium  Attention:   Normal   Concentration:   Anxiety interferes   Orientation:   X5   Recall/memory:   Normal   Affect and Mood  Affect:   Anxious; Depressed; Tearful   Mood:   Worthless; Anxious; Depressed; Hopeless   Relating  Eye contact:   Normal   Facial expression:   Anxious; Depressed   Attitude toward examiner:   Cooperative   Thought and Language  Speech flow:  Normal   Thought content:   Appropriate to Mood and Circumstances   Preoccupation:   Somatic   Hallucinations:   None   Organization:  No data recorded  [employer] of Knowledge:   Average   Intelligence:   Average   Abstraction:   Normal   Judgement:   Fair   Reality Testing:   Adequate   Insight:   Gaps   Decision Making:   Impulsive; Normal; Vacilates   Social Functioning  Social Maturity:   Responsible   Social Judgement:   Normal   Stress  Stressors:   Illness   Coping Ability:   Exhausted; Overwhelmed   Skill Deficits:   None   Supports:   Family     Religion: Religion/Spirituality Are You A Religious Person?: Yes What is Your Religious Affiliation?: Christian How Might This Affect Treatment?: NA  Leisure/Recreation: Leisure / Recreation Do You Have Hobbies?: Yes Leisure and Hobbies: Watching television  Exercise/Diet: Exercise/Diet Do You Exercise?: Yes What Type of Exercise Do You Do?: Other (Comment) (Mowing lawn) How Many Times a Week Do You Exercise?: 1-3 times a week Have You Gained or Lost A Significant Amount of Weight in the Past Six Months?: Yes-Lost Number of Pounds Lost?:  (Unknown) Do You Follow a Special Diet?: No Do You Have Any Trouble Sleeping?: Yes Explanation of Sleeping Difficulties: Pt reports she has not slept in  days   CCA Employment/Education Employment/Work Situation: Employment / Work Situation Employment Situation: Employed Work Stressors: Pt works as a Teacher, adult education Job has Been Impacted by Current Illness: Yes Describe how Patient's Job has Been Impacted: Pt has been unable to work for the past week Has Patient ever Been in the [employer]?: No  Education: Education Is Patient Currently Attending School?: No Did You Product manager?: Yes What Type of College Degree Do you Have?: Pt has a bachelor's degree Did You Have An Individualized Education Program (IIEP): No Did You Have Any Difficulty At [employer]?: No Patient's Education Has Been Impacted by Current Illness: No   CCA Family/Childhood History Family and Relationship History: Family history Marital status: Single Does patient have children?: Yes How many children?: 3 How is patient's relationship  with their children?: Pt reports good relationship with her 65 year old daughter, 48 year old son, and 57-year-old son  Childhood History:  Childhood History By whom was/is the patient raised?: Mother Did patient suffer any verbal/emotional/physical/sexual abuse as a child?: No Did patient suffer from severe childhood neglect?: No Has patient ever been sexually abused/assaulted/raped as an adolescent or adult?: No Was the patient ever a victim of a crime or a disaster?: No Witnessed domestic violence?: No Has patient been affected by domestic violence as an adult?: No  Child/Adolescent Assessment:     CCA Substance Use Alcohol/Drug Use: Alcohol / Drug Use Pain Medications: Denies abuse Prescriptions: Denies abuse Over the Counter: Denies abuse History of alcohol / drug use?: No history of alcohol / drug abuse Longest period of sobriety (when/how long): NA                         ASAM's:  Six Dimensions of Multidimensional Assessment  Dimension 1:  Acute Intoxication and/or Withdrawal  Potential:      Dimension 2:  Biomedical Conditions and Complications:      Dimension 3:  Emotional, Behavioral, or Cognitive Conditions and Complications:     Dimension 4:  Readiness to Change:     Dimension 5:  Relapse, Continued use, or Continued Problem Potential:     Dimension 6:  Recovery/Living Environment:     ASAM Severity Score:    ASAM Recommended Level of Treatment:     Substance use Disorder (SUD)    Recommendations for Services/Supports/Treatments:    Discharge Disposition: Discharge Disposition Medical Exam completed: Yes  DSM5 Diagnoses: Patient Active Problem List   Diagnosis Date Noted   Prediabetes 09/25/2021   Insomnia 09/25/2021   Hyperlipidemia 08/17/2015   Generalized anxiety disorder 12/23/2014   Morbid obesity (HCC)    Asthma 04/02/2012     Referrals to Alternative Service(s): Referred to Alternative Service(s):   Place:   Date:   Time:    Referred to Alternative Service(s):   Place:   Date:   Time:    Referred to Alternative Service(s):   Place:   Date:   Time:    Referred to Alternative Service(s):   Place:   Date:   Time:     Pamalee Leyden, [facility]

## 2022-03-24 NOTE — Progress Notes (Signed)
Per [facility] admissions, pt has been accepted to [facility] main campus. Accepting provider is Dr. Jola Babinski. Patient can arrive anytime. Number for report is [phone] or [phone]. Acceptance is pending negative COVID result.    [name], MSW, LCSW-A Phone: [phone] Disposition/TOC

## 2022-03-24 NOTE — ED Notes (Signed)
Attempted to call report again to [facility] no answer to either number provided.

## 2022-03-24 NOTE — ED Notes (Signed)
Pt has been asleep since 1400, resting comfortably. Pt stated she was appreciative to get some sleep. Attempted to call report x2 again to [facility], no success.   Also attempted to return husbands phone call per pt request but he did not answer.

## 2022-03-24 NOTE — ED Triage Notes (Signed)
Pt presents to [facility] voluntarily, accompanied by her mother. Pt states " my nerves are bad and I can't sleep". Pt is visibly upset, crying and reports this causing her problems for about a week. Pt' s mom reports that pt was seen at [facility] on Wednesday for abdominal pains. Pt is currently having stomach pains and states " I'm tired of putting my family through this". Pt reports that her 3 children are suppportive, but sometimes she just wants it to all be over. Pt denies SI, HI, AVH and substance/alcohol use.

## 2022-03-24 NOTE — Progress Notes (Signed)
CSW re-faxed COVID result to [facility] as requested for review.   Crissie Reese, MSW, Lenice Pressman Phone: [phone] Disposition/TOC

## 2022-03-24 NOTE — ED Notes (Signed)
Again no answer at [facility] tried twice with out success

## 2022-03-24 NOTE — ED Notes (Signed)
Pt is very anxious, she cant sit still, she is moaning out loudly and consistently asking for Zyprexa. It has been explained to her that Ativan was ordered for her by the NP and she has to give it time to work. Pt just looked at Clinical research associate with a blank stare

## 2022-03-24 NOTE — Progress Notes (Signed)
Per Cecilio Asper, NP, patient meets criteria for inpatient treatment. There are no available beds at [facility] today. CSW faxed referrals to the following facilities for review:  [facility]  Pending - Request Sent N/A [address] [place] [address] [phone] --  [facility]  Pending - Request Sent N/A [address], [place] [address] [phone] --  [facility]  Pending - Request Sent N/A [address] [place] [address] [phone] --  [facility]  Pending - Request Sent N/A [address] [place] [address] [phone] --  [facility]  Pending - Request Sent N/A [address] [place] [address] [phone] --  [facility]  Pending - Request Sent N/A [address] [place], [place] [address] [phone] --  [facility]  Pending - Request Sent N/A [address] [place] [address] [phone] --  [facility]  Pending - Request Sent N/A [address], [place] [address] [phone] --  [facility]  Pending - Request Sent N/A [address], [place] [address] [phone] --  [facility]  Pending - Request Sent N/A [address] [place] [address] [phone] --  [facility]  Pending - Request Sent N/A [address], [place] [address] [phone] --  [facility]  Pending - Request Sent N/A [address] [place] [address] [phone] --  [facility]  Pending - Request Sent N/A [address] [place] [address] [phone] --  [facility]  Pending - Request Sent N/A [address], [place] [address] [phone] --  [facility]  Pending - Request Sent N/A [address], [place] [address] [phone] --  [facility]  Pending - Request Sent N/A [address] [place] [address] [phone] --   TTS will continue to seek bed placement.  Crissie Reese, MSW, Lenice Pressman Phone: [phone] Disposition/TOC

## 2022-03-24 NOTE — ED Provider Notes (Signed)
FBC/OBS ASAP Discharge Summary  Date and Time: 03/24/2022 8:08 PM  Name: [name]  MRN:  [id number]   Discharge Diagnoses:  Final diagnoses:  Suicidal ideation  Severe episode of recurrent major depressive disorder, with psychotic features (HCC)   Subjective:  Pt reassessed by nurse practitioner today.  Pt reports feeling anxious, depressed since last 2022/12/30 03/17/22. Reports on Friday, 03/16/22 she was given [facility] 1.75mg , when she is supposed to be on Wegovy 0.25mg . Reports since then, she has been experiencing LUQ pain. Pt was evaluated in the ED on 03/19/22 and 03/20/22 and medically cleared.   Labwork reviewed w/ Dr. Particia Nearing, EDP, overall reassuring given recent labwork and ED workup.   Pt reports experiencing active SI, w/ a plan to "slit my wrists when my kids are sleeping". Reports children are currently 20 y/o, 26 y/o, and 41 y/o. She reports she feels like a burden as she has been unable to work. 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  She denies current AVH, paranoia.  Pt denies hx of NSSI, SA. She denies family psychiatric hx. She denies she is connected w/ counseling or psychiatric medication management. She denies substance use.  Pt appears restless, agitated states this is from anxiety, denies from pain. She reports experiencing similar anxiety when she was at [facility]. Per chart review, she has had 5 admissions to [facility], with last admission in 29-Dec-2005. She states she remembers taking zyprexa which was helpful for her anxiety at the time. 1 time dose of zyprexa 5mg  ordered.  Pt continues to meet criteria for inpatient admission. She has been accepted to [facility] for inpatient admission.  Pt is a&ox3. She appears appropriate for environment. Eye contact is good. Speech is clear and coherent, w/ nml volume. Reported mood is depressed, anxious. Affect is congruent. TP is coherent. Description of associations is intact. TC is logical. There is no evidence of responding to  internal stimuli. Pt appears restless, agitated. Non-aggressive. Pleasant and cooperative.  Stay Summary:  Pt is a 41 y/o female presenting to [facility] on 03/24/22. Pt recommended for inpatient admission. Has been accepted to [facility].  Total Time spent with patient: 20 minutes  Past Psychiatric History: Hx of generalized anxiety disorder Past Medical History:  Past Medical History:  Diagnosis Date   Abnormal Pap smear 12/29/2009   Asthma    "out grew it" no inhaler   Dysplasia of cervix, low grade (CIN 1)    Generalized anxiety disorder 12/23/2014   H/O cystitis 12-29-2000   H/O pyelonephritis 12-29-2000   H/O rubella    H/O varicella    History of cesarean delivery 03-29-12   12-30-2010 for breech    History of chlamydia 12/27/2000   History of induced abortion 2012-03-29   x2    History of PID 03/29/2006   Monilial vaginitis 08/2006   Obese    Pain pelvic 2000-12-29   Unexpected sudden death of infant Mar 29, 2012   12-30-2010, SIDS at 78mos     Vaginal bleeding in pregnancy 12/30/11    Past Surgical History:  Procedure Laterality Date   bilateral tubal ligation     CESAREAN SECTION     CESAREAN SECTION  07/08/2012   Procedure: CESAREAN SECTION;  Surgeon: 07/11/2012, MD;  Location: [facility];  Service: Obstetrics;  Laterality: N/A;   CYSTOSCOPY  06/26/2012   Procedure: CYSTOSCOPY;  Surgeon: 07/11/2012, MD;  Location: [facility];  Service: Obstetrics;  Laterality: N/A;   headaches  30-Dec-2011  during pregnancy   Family History:  Family History  Problem Relation Age of Onset   Cancer Maternal Grandmother        cervical   Diabetes Paternal Grandmother    Heart disease Maternal Grandfather    Hyperlipidemia Maternal Grandfather    Hypertension Maternal Grandfather    Other Neg Hx    Family Psychiatric History: Denies family psychiatric hx Social History:  Social History   Substance and Sexual Activity  Alcohol Use Not Currently   Alcohol/week: 0.0 standard drinks of alcohol     Social History    Substance and Sexual Activity  Drug Use Never    Social History   Socioeconomic History   Marital status: Single    Spouse name: Not on file   Number of children: Not on file   Years of education: Not on file   Highest education level: Not on file  Occupational History   Not on file  Tobacco Use   Smoking status: Former   Smokeless tobacco: Never  Vaping Use   Vaping Use: Never used  Substance and Sexual Activity   Alcohol use: Not Currently    Alcohol/week: 0.0 standard drinks of alcohol   Drug use: Never   Sexual activity: Not Currently    Birth control/protection: Pill  Other Topics Concern   Not on file  Social History Narrative   Not on file   Social Determinants of Health   Financial Resource Strain: Not on file  Food Insecurity: Not on file  Transportation Needs: Not on file  Physical Activity: Not on file  Stress: Not on file  Social Connections: Not on file   SDOH:  SDOH Screenings   Alcohol Screen: Low Risk  (01/24/2021)   Alcohol Screen    Last Alcohol Screening Score (AUDIT): 0  Depression (PHQ2-9): Low Risk  (09/25/2021)   Depression (PHQ2-9)    PHQ-2 Score: 0  Financial Resource Strain: Not on file  Food Insecurity: Not on file  Housing: Not on file  Physical Activity: Not on file  Social Connections: Not on file  Stress: Not on file  Tobacco Use: Medium Risk (03/20/2022)   Patient History    Smoking Tobacco Use: Former    Smokeless Tobacco Use: Never    Passive Exposure: Not on file  Transportation Needs: Not on file    Tobacco Cessation:  N/A, patient does not currently use tobacco products  Current Medications:  Current Facility-Administered Medications  Medication Dose Route Frequency Provider Last Rate Last Admin   acetaminophen (TYLENOL) tablet 650 mg  650 mg Oral Q6H PRN Ajibola, Ene A, NP       alum & mag hydroxide-simeth (MAALOX/MYLANTA) 200-200-20 MG/5ML suspension 30 mL  30 mL Oral Q4H PRN Ajibola, Ene A, NP        dicyclomine (BENTYL) tablet 20 mg  20 mg Oral BID Ajibola, Ene A, NP   20 mg at 03/24/22 4854   hydrOXYzine (ATARAX) tablet 25 mg  25 mg Oral TID PRN Ajibola, Ene A, NP   25 mg at 03/24/22 1319   magnesium hydroxide (MILK OF MAGNESIA) suspension 30 mL  30 mL Oral Daily PRN Ajibola, Ene A, NP       ondansetron (ZOFRAN-ODT) disintegrating tablet 4 mg  4 mg Oral Q8H PRN Ajibola, Ene A, NP       pantoprazole (PROTONIX) EC tablet 40 mg  40 mg Oral Daily Ajibola, Ene A, NP   40 mg at 03/24/22 0921   traZODone (DESYREL)  tablet 50 mg  50 mg Oral QHS PRN Ajibola, Ene A, NP       Current Outpatient Medications  Medication Sig Dispense Refill   aspirin EC 81 MG tablet Take 81 mg by mouth daily. Swallow whole.     Cholecalciferol (VITAMIN D3 SUPER STRENGTH PO) Take 1 capsule by mouth daily.     dicyclomine (BENTYL) 20 MG tablet Take 1 tablet (20 mg total) by mouth 2 (two) times daily. 20 tablet 0   hydrOXYzine (ATARAX) 25 MG tablet Take 1 tablet (25 mg total) by mouth every 6 (six) hours. 25 tablet 0   Norgestimate-Ethinyl Estradiol Triphasic (TRI-LO-SPRINTEC) 0.18/0.215/0.25 MG-25 MCG tab Take 1 tablet by mouth daily. 28 tablet 5   omeprazole (PRILOSEC) 20 MG capsule Take 1 capsule (20 mg total) by mouth daily. 30 capsule 1   ondansetron (ZOFRAN-ODT) 4 MG disintegrating tablet Take 1 tablet (4 mg total) by mouth every 8 (eight) hours as needed for nausea or vomiting. 20 tablet 0   phentermine 37.5 MG capsule Take 1 capsule (37.5 mg total) by mouth every morning. 30 capsule 2   WEGOVY 1.7 MG/0.75ML SOAJ Inject 1.7 mg into the skin every 7 (seven) days.      PTA Medications: (Not in a hospital admission)      09/25/2021    8:52 AM 01/24/2021    2:15 PM  Depression screen PHQ 2/9  Decreased Interest 0 0  Down, Depressed, Hopeless 0 1  PHQ - 2 Score 0 1  Altered sleeping 0 1  Tired, decreased energy 0 0  Change in appetite 0 1  Feeling bad or failure about yourself  0 0  Trouble concentrating 0 0   Moving slowly or fidgety/restless 0 0  Suicidal thoughts 0 0  PHQ-9 Score 0 3  Difficult doing work/chores Not difficult at all Not difficult at all    [facility] ED from 03/24/2022 in [facility] ED from 03/20/2022 in [facility] EMERGENCY DEPARTMENT ED from 03/19/2022 in [facility] [place]   C-SSRS RISK CATEGORY Moderate Risk No Risk No Risk       Musculoskeletal  Strength & Muscle Tone: within normal limits Gait & Station: normal Patient leans: N/A  Psychiatric Specialty Exam  Presentation  General Appearance: Appropriate for Environment  Eye Contact:Good  Speech:Clear and Coherent  Speech Volume:Normal  Handedness:Right   Mood and Affect  Mood:Depressed; Anxious  Affect:Congruent   Thought Process  Thought Processes:Coherent  Descriptions of Associations:Intact  Orientation:Full (Time, Place and Person)  Thought Content:Logical  Diagnosis of Schizophrenia or Schizoaffective disorder in past: No    Hallucinations:Hallucinations: None  Ideas of Reference:None  Suicidal Thoughts:Suicidal Thoughts: Yes, Active SI Active Intent and/or Plan: With Plan  Homicidal Thoughts:Homicidal Thoughts: No   Sensorium  Memory:Immediate Good; Recent Good  Judgment:Fair  Insight:Good   Executive Functions  Concentration:Fair  Attention Span:Fair  Recall:Good  Fund of Knowledge:Good  Language:Good   Psychomotor Activity  Psychomotor Activity:Psychomotor Activity: Restlessness   Assets  Assets:Communication Skills; Desire for Improvement; Financial Resources/Insurance; Housing; Social Support   Sleep  Sleep:Sleep: Poor   No data recorded  Physical Exam  Physical Exam Pulmonary:     Effort: Pulmonary effort is normal.  Neurological:     Mental Status: She is alert and oriented to person, place, and time.  Psychiatric:        Attention and Perception: Attention and perception  normal.        Mood  and Affect: Mood is anxious and depressed. Affect is flat.        Speech: Speech normal.        Behavior: Behavior normal. Behavior is cooperative.        Thought Content: Thought content includes suicidal ideation. Thought content includes suicidal plan.    Review of Systems  Constitutional:  Negative for chills and fever.  Respiratory:  Negative for shortness of breath.   Cardiovascular:  Negative for chest pain and palpitations.  Gastrointestinal:  Negative for abdominal pain.  Neurological:  Negative for dizziness and headaches.  Psychiatric/Behavioral:  Positive for depression and suicidal ideas. The patient is nervous/anxious.    Blood pressure 116/81, pulse 89, temperature 99 F (37.2 C), temperature source Oral, resp. rate 17, last menstrual period 03/12/2022, SpO2 100 %. There is no height or weight on file to calculate BMI.  Demographic Factors:  NA  Loss Factors: NA  Historical Factors: Prior suicide attempts  Risk Reduction Factors:   Responsible for children under 65 years of age, Sense of responsibility to family, Employed, Living with another person, especially a relative, and Positive social support  Continued Clinical Symptoms:  Previous Psychiatric Diagnoses and Treatments  Cognitive Features That Contribute To Risk:  None    Suicide Risk:  Extreme:  Frequent, intense, and enduring suicidal ideation, specific plans, clear subjective and objective intent, impaired self-control, severe dysphoria/symptomatology, many risk factors and no protective factors.  Plan Of Care/Follow-up recommendations:  Inpatient admission  Disposition:  Inpatient admission  Lauree Chandler, NP 03/24/2022, 8:08 PM

## 2022-03-24 NOTE — ED Notes (Signed)
Pt sleeping@this time. Breathing even and unlabored. Will continue to monitor for safety 

## 2022-03-24 NOTE — ED Notes (Signed)
Attempted to call report to [facility], again. No success.

## 2022-03-24 NOTE — ED Provider Notes (Addendum)
[facility]gent [facility] Continuous Assessment Admission H&P  Date: 03/24/22 Patient Name: [name] MRN: [id number] Chief Complaint:  Chief Complaint  Patient presents with   Anxiety      Diagnoses:  Final diagnoses:  Suicidal ideation  Severe episode of recurrent major depressive disorder, with psychotic features (HCC)    HPI: [name] is a 41 year old female with reported psychiatric history of depression and anxiety.  Patient presented voluntarily to [facility] for a walk-in assessment.  Patient is accompanied by her mother, Leilanee Righetti who remain present during assessment with consent from patient.  Patient was seen face to face and her chart was reviewed by this nurse practitioner.  On assessment, patient is observed in assessment room with her mother.  She is noted to be tearful, restless, anxious, and unable to sit still. Her speech is clear and coherent, she has trouble completing assessment due to anxiety. She gave verbal consent for her mother to provide assessment information.   Per patient and her mother, patient has been feeling restless for the past several days. Mother says patient is constantly crawling on bed and other surfaces because she is anxious and unable to sit. Patient is noted with abrasions to her knees and elbow. She reports that she has not slept since Monday 03/19/22. She is also complaining of abdominal pain and nausea. She says she has not been eating due to fear of nausea and vomiting. Per chart review patient has been to the ED and urgent care with similar complaint and CT abdomen on 03/20/22 was negative.   Also patient was recently started on Wegovy. Per chart and patient, she is suppose to be on Wegovy 0.25mg /week subQ however patient reports she was given [facility] 1.75mg  SubQ 03/16/22. Possible side effects of Wegovy includes GI discomfort, pancreatitis, gallstones, depression, and suicidal ideation. It may take several weeks for [facility] to be completely of out her  system.   Patient reports that she feels she is a burden on her family due to ongoing symptoms. She says has not been able to work and has been feeling depressed and suicidal. She is endorsing depressive symptoms of hopelessness, worthlessness, racing thoughts, anxiety, poor focus, fatigue, difficulty sleeping. She states "I just want to give up, I want to end it all I am a burden on my family."  She reports suicidal ideation with plan to " slit my wrist when my kids are sleeping."  She denies homicidal ideation, paranoia, and substance abuse.  She reports experiencing visual hallucination of "seeing a baby." She says her infant child died 5 years ago due to SID.      PHQ 2-9:  Flowsheet Row Office Visit from 09/25/2021 in [facility] Office Visit from 01/24/2021 in [facility]  Thoughts that you would be better off dead, or of hurting yourself in some way Not at all Not at all  PHQ-9 Total Score 0 3       Flowsheet Row ED from 03/24/2022 in [facility] ED from 03/20/2022 in [facility] EMERGENCY DEPARTMENT ED from 03/19/2022 in [facility] at [facility]   C-SSRS RISK CATEGORY Moderate Risk No Risk No Risk        Total Time spent with patient: 45 minutes  Musculoskeletal  Strength & Muscle Tone: within normal limits Gait & Station: normal Patient leans: Right  Psychiatric Specialty Exam  Presentation General Appearance: Appropriate for Environment  Eye Contact:Good  Speech:Clear and Coherent  Speech Volume:Normal  Handedness:Right   Mood and Affect  Mood:Depressed; Anxious  Affect:Congruent   Thought Process  Thought Processes:Coherent  Descriptions of Associations:Intact  Orientation:No data recorded Thought Content:WDL    Hallucinations:Hallucinations: None  Ideas of Reference:None  Suicidal Thoughts:Suicidal Thoughts: Yes, Active SI Active Intent and/or Plan: With  Plan  Homicidal Thoughts:Homicidal Thoughts: No   Sensorium  Memory:Immediate Good; Recent Good; Remote Fair  Judgment:Fair  Insight:Good   Executive Functions  Concentration:Fair  Attention Span:Good  Recall:Good  Fund of Knowledge:Good  Language:Good   Psychomotor Activity  Psychomotor Activity:Psychomotor Activity: Normal   Assets  Assets:Communication Skills; Desire for Improvement; Housing; Social Support; Vocational/Educational; Transportation   Sleep  Sleep:Sleep: Poor   No data recorded  Physical Exam Vitals and nursing note reviewed.  Constitutional:      General: She is not in acute distress.    Appearance: She is well-developed. She is not ill-appearing.  HENT:     Head: Normocephalic and atraumatic.  Eyes:     Conjunctiva/sclera: Conjunctivae normal.  Cardiovascular:     Rate and Rhythm: Tachycardia present.  Pulmonary:     Effort: Pulmonary effort is normal. No respiratory distress.  Abdominal:     Palpations: Abdomen is soft.     Tenderness: There is no abdominal tenderness.  Musculoskeletal:        General: No swelling.     Cervical back: Neck supple.  Skin:    General: Skin is warm.  Neurological:     Mental Status: She is alert and oriented to person, place, and time.  Psychiatric:        Attention and Perception: Attention and perception normal.        Mood and Affect: Mood is anxious and depressed. Affect is tearful.        Speech: Speech normal.        Behavior: Behavior normal. Behavior is cooperative.        Thought Content: Thought content includes suicidal ideation. Thought content includes suicidal plan.    Review of Systems  Constitutional: Negative.   HENT: Negative.    Eyes: Negative.   Respiratory: Negative.    Cardiovascular: Negative.   Gastrointestinal: Negative.   Genitourinary: Negative.   Musculoskeletal: Negative.   Skin: Negative.   Neurological: Negative.   Endo/Heme/Allergies: Negative.    Psychiatric/Behavioral:  Positive for depression, hallucinations and suicidal ideas. The patient is nervous/anxious.     Blood pressure (!) 137/104, pulse (!) 103, temperature 98.1 F (36.7 C), temperature source Oral, resp. rate 20, last menstrual period 03/12/2022, SpO2 97 %. There is no height or weight on file to calculate BMI.  Past Psychiatric History:    Is the patient at risk to self? Yes  Has the patient been a risk to self in the past 6 months? No .    Has the patient been a risk to self within the distant past? No   Is the patient a risk to others? No   Has the patient been a risk to others in the past 6 months? No   Has the patient been a risk to others within the distant past? No   Past Medical History:  Past Medical History:  Diagnosis Date   Abnormal Pap smear 2011   Asthma    "out grew it" no inhaler   Dysplasia of cervix, low grade (CIN 1)    Generalized anxiety disorder 12/23/2014   H/O cystitis 2002   H/O pyelonephritis 2002   H/O rubella  H/O varicella    History of cesarean delivery 04/05/2012   2012 for breech    History of chlamydia 12/27/2000   History of induced abortion 04/05/2012   x2    History of PID 03/29/2006   Monilial vaginitis 08/2006   Obese    Pain pelvic 2002   Unexpected sudden death of infant 03/13/2012   2012, SIDS at 3mos     Vaginal bleeding in pregnancy 2013    Past Surgical History:  Procedure Laterality Date   bilateral tubal ligation     CESAREAN SECTION     CESAREAN SECTION  06/22/2012   Procedure: CESAREAN SECTION;  Surgeon: Kirkland HunArthur Stringer, MD;  Location: [facility];  Service: Obstetrics;  Laterality: N/A;   CYSTOSCOPY  06/19/2012   Procedure: CYSTOSCOPY;  Surgeon: Kirkland HunArthur Stringer, MD;  Location: [facility];  Service: Obstetrics;  Laterality: N/A;   headaches  2013    during pregnancy    Family History:  Family History  Problem Relation Age of Onset   Cancer Maternal Grandmother        cervical   Diabetes Paternal  Grandmother    Heart disease Maternal Grandfather    Hyperlipidemia Maternal Grandfather    Hypertension Maternal Grandfather    Other Neg Hx     Social History:  Social History   Socioeconomic History   Marital status: Single    Spouse name: Not on file   Number of children: Not on file   Years of education: Not on file   Highest education level: Not on file  Occupational History   Not on file  Tobacco Use   Smoking status: Former   Smokeless tobacco: Never  Vaping Use   Vaping Use: Never used  Substance and Sexual Activity   Alcohol use: Not Currently    Alcohol/week: 0.0 standard drinks of alcohol   Drug use: Never   Sexual activity: Not Currently    Birth control/protection: Pill  Other Topics Concern   Not on file  Social History Narrative   Not on file   Social Determinants of Health   Financial Resource Strain: Not on file  Food Insecurity: Not on file  Transportation Needs: Not on file  Physical Activity: Not on file  Stress: Not on file  Social Connections: Not on file  Intimate Partner Violence: Not on file    SDOH:  SDOH Screenings   Alcohol Screen: Low Risk  (01/24/2021)   Alcohol Screen    Last Alcohol Screening Score (AUDIT): 0  Depression (PHQ2-9): Low Risk  (09/25/2021)   Depression (PHQ2-9)    PHQ-2 Score: 0  Financial Resource Strain: Not on file  Food Insecurity: Not on file  Housing: Not on file  Physical Activity: Not on file  Social Connections: Not on file  Stress: Not on file  Tobacco Use: Medium Risk (03/20/2022)   Patient History    Smoking Tobacco Use: Former    Smokeless Tobacco Use: Never    Passive Exposure: Not on file  Transportation Needs: Not on file    Last Labs:  Admission on 03/20/2022, Discharged on 03/21/2022  Component Date Value Ref Range Status   Sodium 03/20/2022 138  135 - 145 mmol/L Final   Potassium 03/20/2022 4.0  3.5 - 5.1 mmol/L Final   Chloride 03/20/2022 106  98 - 111 mmol/L Final   CO2  03/20/2022 17 (L)  22 - 32 mmol/L Final   Glucose, Bld 03/20/2022 98  70 - 99 mg/dL Final   Glucose  reference range applies only to samples taken after fasting for at least 8 hours.   BUN 03/20/2022 19  6 - 20 mg/dL Final   Creatinine, Ser 03/20/2022 0.94  0.44 - 1.00 mg/dL Final   Calcium 48/88/9169 9.4  8.9 - 10.3 mg/dL Final   GFR, Estimated 03/20/2022 >60  >60 mL/min Final   Comment: (NOTE) Calculated using the CKD-EPI Creatinine Equation (2021)    Anion gap 03/20/2022 15  5 - 15 Final   Performed at [facility], [address], [place], [place] [address]   WBC 03/20/2022 8.5  4.0 - 10.5 K/uL Final   RBC 03/20/2022 4.65  3.87 - 5.11 MIL/uL Final   Hemoglobin 03/20/2022 13.0  12.0 - 15.0 g/dL Final   HCT 88/28/0034 40.0  36.0 - 46.0 % Final   MCV 03/20/2022 86.0  80.0 - 100.0 fL Final   MCH 03/20/2022 28.0  26.0 - 34.0 pg Final   MCHC 03/20/2022 32.5  30.0 - 36.0 g/dL Final   RDW 91/79/1505 13.5  11.5 - 15.5 % Final   Platelets 03/20/2022 314  150 - 400 K/uL Final   nRBC 03/20/2022 0.0  0.0 - 0.2 % Final   Performed at [facility], [address] [place], [place] [address]   Troponin I (High Sensitivity) 03/20/2022 4  <18 ng/L Final   Comment: (NOTE) Elevated high sensitivity troponin I (hsTnI) values and significant  changes across serial measurements may suggest ACS but many other  chronic and acute conditions are known to elevate hsTnI results.  Refer to the "Links" section for chest pain algorithms and additional  guidance. Performed at [facility], [address], [place], [place] [address]    I-stat hCG, quantitative 03/20/2022 <5.0  <5 mIU/mL Final   Comment 3 03/20/2022          Final   Comment:   GEST. AGE      CONC.  (mIU/mL)   <=1 WEEK        5 - 50     2 WEEKS       50 - 500     3 WEEKS       100 - 10,000     4 WEEKS     1,000 - 30,000        FEMALE AND NON-PREGNANT FEMALE:     LESS THAN 5 mIU/mL    Lipase 03/20/2022 61 (H)  11 - 51 U/L  Final   Performed at [facility], [address], [place], [place] [address]   Color, Urine 03/20/2022 YELLOW  YELLOW Final   APPearance 03/20/2022 HAZY (A)  CLEAR Final   Specific Gravity, Urine 03/20/2022 >1.030 (H)  1.005 - 1.030 Final   pH 03/20/2022 6.0  5.0 - 8.0 Final   Glucose, UA 03/20/2022 NEGATIVE  NEGATIVE mg/dL Final   Hgb urine dipstick 03/20/2022 LARGE (A)  NEGATIVE Final   Bilirubin Urine 03/20/2022 SMALL (A)  NEGATIVE Final   Ketones, ur 03/20/2022 >80 (A)  NEGATIVE mg/dL Final   Protein, ur 27/03/8674 100 (A)  NEGATIVE mg/dL Final   Nitrite 44/92/0100 NEGATIVE  NEGATIVE Final   Leukocytes,Ua 03/20/2022 NEGATIVE  NEGATIVE Final   Performed at [facility], [address] [place], [place] [address]   Troponin I (High Sensitivity) 03/20/2022 4  <18 ng/L Final   Comment: (NOTE) Elevated high sensitivity troponin I (hsTnI) values and significant  changes across serial measurements may suggest ACS  but many other  chronic and acute conditions are known to elevate hsTnI results.  Refer to the "Links" section for chest pain algorithms and additional  guidance. Performed at [facility], [address], [place], [place] [address]    Total Protein 03/20/2022 8.7 (H)  6.5 - 8.1 g/dL Final   Albumin 36/64/4034 3.8  3.5 - 5.0 g/dL Final   AST 74/25/9563 37  15 - 41 U/L Final   ALT 03/20/2022 23  0 - 44 U/L Final   Alkaline Phosphatase 03/20/2022 79  38 - 126 U/L Final   Total Bilirubin 03/20/2022 1.2  0.3 - 1.2 mg/dL Final   Bilirubin, Direct 03/20/2022 0.5 (H)  0.0 - 0.2 mg/dL Final   Indirect Bilirubin 03/20/2022 0.7  0.3 - 0.9 mg/dL Final   Performed at [facility], [address], [place], [place] [address]   Opiates 03/20/2022 NONE DETECTED  NONE DETECTED Final   Cocaine 03/20/2022 NONE DETECTED  NONE DETECTED Final   Benzodiazepines 03/20/2022 NONE DETECTED  NONE DETECTED Final   Amphetamines 03/20/2022 NONE DETECTED  NONE DETECTED Final    Tetrahydrocannabinol 03/20/2022 NONE DETECTED  NONE DETECTED Final   Barbiturates 03/20/2022 NONE DETECTED  NONE DETECTED Final   Comment: (NOTE) DRUG SCREEN FOR MEDICAL PURPOSES ONLY.  IF CONFIRMATION IS NEEDED FOR ANY PURPOSE, NOTIFY LAB WITHIN 5 DAYS.  LOWEST DETECTABLE LIMITS FOR URINE DRUG SCREEN Drug Class                     Cutoff (ng/mL) Amphetamine and metabolites    1000 Barbiturate and metabolites    200 Benzodiazepine                 200 Tricyclics and metabolites     300 Opiates and metabolites        300 Cocaine and metabolites        300 THC                            50 Performed at [facility], [address] [place], [place] [address]    Lactic Acid, Venous 03/20/2022 1.4  0.5 - 1.9 mmol/L Final   Performed at [facility], [address] [place], [place] [address]   RBC / HPF 03/20/2022 11-20  0 - 5 RBC/hpf Final   WBC, UA 03/20/2022 0-5  0 - 5 WBC/hpf Final   Bacteria, UA 03/20/2022 FEW (A)  NONE SEEN Final   Squamous Epithelial / LPF 03/20/2022 0-5  0 - 5 Final   Mucus 03/20/2022 PRESENT   Final   Performed at [facility], [address], [place], [place] [address]  Admission on 03/19/2022, Discharged on 03/19/2022  Component Date Value Ref Range Status   WBC 03/19/2022 9.6  4.0 - 10.5 K/uL Final   RBC 03/19/2022 4.69  3.87 - 5.11 MIL/uL Final   Hemoglobin 03/19/2022 13.1  12.0 - 15.0 g/dL Final   HCT 01/60/1093 40.4  36.0 - 46.0 % Final   MCV 03/19/2022 86.1  80.0 - 100.0 fL Final   MCH 03/19/2022 27.9  26.0 - 34.0 pg Final   MCHC 03/19/2022 32.4  30.0 - 36.0 g/dL Final   RDW 23/55/7322 13.9  11.5 - 15.5 % Final   Platelets 03/19/2022 311  150 - 400 K/uL Final   nRBC 03/19/2022 0.0  0.0 - 0.2 % Final   Neutrophils  Relative % 03/19/2022 66  % Final   Neutro Abs 03/19/2022 6.3  1.7 - 7.7 K/uL Final   Lymphocytes Relative 03/19/2022 29  % Final   Lymphs Abs 03/19/2022 2.8  0.7 - 4.0 K/uL Final   Monocytes Relative 03/19/2022 5  %  Final   Monocytes Absolute 03/19/2022 0.5  0.1 - 1.0 K/uL Final   Eosinophils Relative 03/19/2022 0  % Final   Eosinophils Absolute 03/19/2022 0.0  0.0 - 0.5 K/uL Final   Basophils Relative 03/19/2022 0  % Final   Basophils Absolute 03/19/2022 0.0  0.0 - 0.1 K/uL Final   Immature Granulocytes 03/19/2022 0  % Final   Abs Immature Granulocytes 03/19/2022 0.02  0.00 - 0.07 K/uL Final   Performed at [facility], [address], [place], [place] [address]   Sodium 03/19/2022 137  135 - 145 mmol/L Final   Potassium 03/19/2022 3.9  3.5 - 5.1 mmol/L Final   Chloride 03/19/2022 101  98 - 111 mmol/L Final   CO2 03/19/2022 24  22 - 32 mmol/L Final   Glucose, Bld 03/19/2022 84  70 - 99 mg/dL Final   Glucose reference range applies only to samples taken after fasting for at least 8 hours.   BUN 03/19/2022 28 (H)  6 - 20 mg/dL Final   Creatinine, Ser 03/19/2022 1.17 (H)  0.44 - 1.00 mg/dL Final   Calcium 47/42/5956 9.4  8.9 - 10.3 mg/dL Final   Total Protein 38/75/6433 9.3 (H)  6.5 - 8.1 g/dL Final   Albumin 29/51/8841 3.9  3.5 - 5.0 g/dL Final   AST 66/02/3015 29  15 - 41 U/L Final   ALT 03/19/2022 19  0 - 44 U/L Final   Alkaline Phosphatase 03/19/2022 89  38 - 126 U/L Final   Total Bilirubin 03/19/2022 0.9  0.3 - 1.2 mg/dL Final   GFR, Estimated 03/19/2022 >60  >60 mL/min Final   Comment: (NOTE) Calculated using the CKD-EPI Creatinine Equation (2021)    Anion gap 03/19/2022 12  5 - 15 Final   Performed at [facility], [address] [place], [place] [address]    Allergies: Patient has no known allergies.  PTA Medications: (Not in a hospital admission)   Medical Decision Making  Patient recommended for inpatient psychiatric treatment. She will be admitted to [facility] for continuous assessment; final disposition pending medical clearance labs.   Lab Orders         Resp Panel by RT-PCR (Flu A&B, Covid) Anterior Nasal Swab         CBC with Differential/Platelet          Comprehensive metabolic panel         Hemoglobin A1c         TSH         Lipid panel    -Reviewed available lab results  -one time dose of Ativan 1mg  Sublingual for anxiety and nausea -start Lexapro 10mg /mg     Recommendations  Patient recently had CT abd and unremarkable labs in the ED. Patient will be admitted to [facility] pending repeat lab result. Patient will be transfer to the ED if labs are unstable or condition worsens   , NP 03/24/22  6:58 AM

## 2022-03-24 NOTE — ED Notes (Signed)
Pt was very drowsy when talking with her. She was calm and  cooperative. She was talking very low. Will continue to monitor for safety

## 2022-03-24 NOTE — ED Notes (Signed)
Multiple attempts have been made to call [facility] with no answer. [phone] and [phone]

## 2022-03-24 NOTE — ED Notes (Signed)
Attempted to call report again x2.

## 2022-03-24 NOTE — ED Notes (Signed)
Pt refused lunch

## 2022-03-25 MED ORDER — OLANZAPINE 5 MG PO TBDP
5.0000 mg | ORAL_TABLET | Freq: Once | ORAL | Status: AC
  Administered 2022-03-25: 5 mg via ORAL
  Filled 2022-03-25: qty 1

## 2022-03-25 NOTE — Progress Notes (Signed)
CSW faxed COVID results to accepting facility, [facility].    Crissie Reese, MSW, Lenice Pressman Phone: [phone] Disposition/TOC

## 2022-03-25 NOTE — ED Notes (Signed)
Provider notified of patients request for xyprexa since it seemed to help her stay more still - new order received

## 2022-03-25 NOTE — ED Notes (Signed)
Pt sleeping@this time. Breathing even and unlabored. Will continue to monitor for safety 

## 2022-03-25 NOTE — Progress Notes (Signed)
CSW followed up with [facility] intake department to inquire about a number for call nurse to nurse report for tomorrow. It was reported that nurse can call [phone].   Crissie Reese, MSW, Lenice Pressman Phone: [phone] Disposition/TOC

## 2022-03-25 NOTE — ED Notes (Signed)
Pt requested items for a shower.  She also requested staff assist in her shower.  When asked why she needed staff's help pt stated she needed assistance while standing in shower area.   Pt stated she could attend to her own adl's with the assistance of a shower chair present to sit in, shower chair obtained. .  Breathing is even and unlabored.  Will continue to monitor for safety.

## 2022-03-25 NOTE — Progress Notes (Signed)
CSW spoke with [facility] from [facility] admissions. It was reported that acceptance will have to delayed until 03/26/2022 after 9:00am. CSW notified treatment team.   Crissie Reese, MSW, LCSW-A, LCAS Phone: [phone] Disposition/TOC

## 2022-03-25 NOTE — ED Notes (Signed)
No pain or discomfort noted/ reported. Pt alert, oriented, and ambulatory.  Denies SI, HI, and AVH. Breathing is even and  unlabored.  Will continue to monitor for safety. Pt pending transfer to [facility] for inpatient care.

## 2022-03-25 NOTE — ED Notes (Signed)
Pt awake at this hour. No apparent distress. RR even and unlabored. Monitored for safety.  

## 2022-03-25 NOTE — ED Provider Notes (Addendum)
Pt initially accepted for inpatient admission at [facility] on 03/24/22. Nursing unable to reach [facility] for report. Notified today by SW that inpatient admission to [facility] has been delayed until 03/26/22 after 9:00AM.  Pt assessed face to face by nurse practitioner. Discussed w/ pt that she has been accepted to [facility] for inpatient admission for tomorrow 03/26/22. Pt verbalized understanding.  Pt notes she feels "a little better" today compared to yesterday, which she attributes to zyprexa. She continues to endorse SI, w/ plan to cut herself when her children are sleeping. She denies VI/HI, AVH, paranoia. She denies abd pain, n/v. Pt states her previous psychiatric hospitalizations were due to this similar presentation, anxiety, restlessness, SI. Pt continues to meet criteria for inpatient admission. She will be transferred to [facility]  Pt is a&xo3. She appears appropriate for environment. Eye contact is good. Speech is clear and coherent, w/ nml volume. Reported mood is depressed, anxious. Affect is congruent. TP is coherent. Description of associations is intact. TC is logical. There is no evidence of internal preoccupation. No delusions or paranoia elicited. Pt is cooperative, pleasant. She appears restless, although improved from yesterday's presentation.

## 2022-03-25 NOTE — ED Notes (Signed)
No pain or discomfort noted/ reported. Pt alert, oriented, and ambulatory. Currently using facility phone.  Breathing is even and  unlabored.  Will continue to monitor for safety.

## 2022-03-26 NOTE — ED Notes (Signed)
Pt aox4 sitting on side of bed calm, interacting with others on the unit.  Pt denies SI, HI, pain, and AVH today.  Pt reports she feels better today then she has previously, states "I think the medication you are giving me is helping."  Informed pt she would be going to [facility] today.  Pt reports she is unsure of going to inpatient facility.  Provider notified.  Breathing is even and unlabored.  Will continue to monitor for safety.

## 2022-03-26 NOTE — ED Notes (Signed)
Pt asleep in bed. Respirations even and unlabored. Will continue to monitor for safety. ?

## 2022-03-26 NOTE — ED Notes (Signed)
Patient discharged.

## 2022-04-05 ENCOUNTER — Telehealth (HOSPITAL_COMMUNITY): Payer: Self-pay | Admitting: Student in an Organized Health Care Education/Training Program

## 2022-04-05 MED ORDER — TRAZODONE HCL 100 MG PO TABS: 100.0000 mg | ORAL_TABLET | Freq: Every day | ORAL | 0 refills | Status: DC

## 2022-04-05 MED ORDER — SERTRALINE HCL 50 MG PO TABS: 50.0000 mg | ORAL_TABLET | Freq: Every day | ORAL | 0 refills | Status: DC

## 2022-04-05 NOTE — Telephone Encounter (Signed)
Patient recently discharged from [facility] and unable to be seen until after her prescriptions run out.  She has appointment scheduled with Dr. Lolly Mustache on Aug 2.  Will send in 2 weeks refills so she can continue her medication until that appointment.  Sent: -Zoloft 50 mg daily. 14 tablets with 0 refills. -Trazodone 100 mg QHS. 14 tablets with 0 refills.    Arna Snipe MD Resident

## 2022-04-11 ENCOUNTER — Ambulatory Visit (HOSPITAL_BASED_OUTPATIENT_CLINIC_OR_DEPARTMENT_OTHER): Payer: 59 | Admitting: Psychiatry

## 2022-04-11 ENCOUNTER — Encounter (HOSPITAL_COMMUNITY): Payer: Self-pay | Admitting: Psychiatry

## 2022-04-11 VITALS — Wt 230.0 lb

## 2022-04-11 DIAGNOSIS — F1994 Other psychoactive substance use, unspecified with psychoactive substance-induced mood disorder: Secondary | ICD-10-CM

## 2022-04-11 DIAGNOSIS — F331 Major depressive disorder, recurrent, moderate: Secondary | ICD-10-CM | POA: Diagnosis not present

## 2022-04-11 DIAGNOSIS — F419 Anxiety disorder, unspecified: Secondary | ICD-10-CM | POA: Diagnosis not present

## 2022-04-11 MED ORDER — SERTRALINE HCL 50 MG PO TABS: 50.0000 mg | ORAL_TABLET | Freq: Every day | ORAL | 0 refills | Status: DC

## 2022-04-11 MED ORDER — TRAZODONE HCL 100 MG PO TABS: 100.0000 mg | ORAL_TABLET | Freq: Every day | ORAL | 0 refills | Status: DC

## 2022-04-11 NOTE — Progress Notes (Signed)
[facility] Initial Assessment Note  Patient Location: Home Provider Location: Home Office   I connected with [name] by video and verified that I am talking with correct person using two identifiers.   I discussed the limitations, risks, security and privacy concerns of performing an evaluation and management service virtually and the availability of in person appointments. I also discussed with the patient that there may be a patient responsible charge related to this service. The patient expressed understanding and agreed to proceed.  [name] [id number] 41 y.o.  04/11/2022 8:57 AM  MDD (major depressive disorder), recurrent episode, moderate (HCC) - Plan: sertraline (ZOLOFT) 50 MG tablet, traZODone (DESYREL) 100 MG tablet  Anxiety - Plan: sertraline (ZOLOFT) 50 MG tablet, traZODone (DESYREL) 100 MG tablet  Mood disorder, drug-induced (HCC) - Plan: sertraline (ZOLOFT) 50 MG tablet   Chief Complaint:  I was admitted at psych hospital.  History of Present Illness:  Patient is 41 year old African-American female who is referred from recent hospitalization.  Patient was initially seen in the emergency room at: Hospital because of restlessness, severe anxiety, unable to sit and having suicidal thoughts to end her life and to cut her wrist.  Children's are sleeping.  She was having severe nausea, abdominal pain, excessive tremors, rolling out of the bed, unable to sit, visual hallucination, unable to eat, social withdrawal with decreased interest, energy, attention and not able to function at work and at home.  Apparently patient was given high dose of Wegovy injection at local urgent care in [place] and she started to have these symptoms.  Prior to injection she was only taking birth control, vitamins and aspirin.  In the Emergency Room she continued to have suicidal thoughts and decision was made to admit inpatient at [facility].  Patient stayed 8 days at  [facility].  She was given trazodone, Zoloft, gabapentin, Requip and primidone.  She noticed some improvement as there are no more suicidal thoughts and visual hallucination.  But she is still very anxious and nervous and depressed about the current situation.  She is frustrated that not able to go back to work.  During the session her hands are constantly moving.  She also reported some weakness on her right side has not able to hold or open the bottle.  She has gait issues and needed to support the wall for walking.  Her speech is nonfluent but her comprehension is good.  She works as a Psychologist, educationaltrainer for phlebotomy and she is concerned that under the circumstances she cannot work.  Her 41 year old daughter is driving her for the doctor's appointment.  Patient is so scared from the medication that she had stopped birth control, acid reflux medicine, vitamin, aspirin, hydroxyzine.  She only wants to take the medicine which is necessary to help her anxiety, depression, tremors.  Patient told that Dr. Had reassured that slowly and gradually effects of Wegovy going may fades out.  She is sleeping better with trazodone.  She denies any panic attack.  She admitted sometime feeling of hopelessness or worthlessness but she also understand it will take some time.  She denies any anhedonia but had lost weight because of excessive nausea.  She still have residual symptoms of decreased energy and feels fatigue.  She denies any mania, impulsive behavior, delusions or grandiosity.  Patient also reported prior to this incident she was thinking to see a therapist because her job is stressful.  She has to travel 28 sites for  training and that sometimes overwhelming.  Patient has bachelor's in health science and she was looking for a better job but so far has not been successful.  Patient lives with her 69 year old daughter, 60 and 40-year-old son.  Her parents lives close by who are also very supportive.  Patient has children  from different relationship and their father does come and check on them.  Patient denies drinking or using any illegal substances.  Currently patient is out of work since July 10 and not able to go back to work.  Patient primary care doctor is Kelli Hope.  She has not seen neurology recently.  Past Psychiatric History: Patient has history of drinking and drug use.  She was inpatient in January 03, 2006 for above reason but never had a follow-up with psychiatrist.  She stopped drinking and using drugs 4 years ago and required inpatient rehab but she do not remember the details.  Patient denies any history of suicidal attempt,.  In July 2023 she was seen in the emergency room after given higher dose of weight loss medication.  At that time she was having hallucination, suicidal thoughts, restlessness, tremors.  She required inpatient at [facility].  She was discharged on Requip, gabapentin, primidone, Zoloft and trazodone  Family History  Problem Relation Age of Onset   Cancer Maternal Grandmother        cervical   Diabetes Paternal Grandmother    Heart disease Maternal Grandfather    Hyperlipidemia Maternal Grandfather    Hypertension Maternal Grandfather    Other Neg Hx       Past Medical History:  Diagnosis Date   Abnormal Pap smear 01/03/10   Asthma    "out grew it" no inhaler   Dysplasia of cervix, low grade (CIN 1)    Generalized anxiety disorder 12/23/2014   H/O cystitis 01-03-2001   H/O pyelonephritis 01/03/2001   H/O rubella    H/O varicella    History of cesarean delivery 2012-04-02   01/04/2011 for breech    History of chlamydia 12/27/2000   History of induced abortion April 02, 2012   x2    History of PID 03/29/2006   Monilial vaginitis 08/2006   Obese    Pain pelvic 2001/01/03   Unexpected sudden death of infant 04-02-2012   01/04/2011, SIDS at 45mos     Vaginal bleeding in pregnancy 2012/01/04     Traumatic Head Injury: Patient denies any history of traumatic brain injury.  Work History; Patient started working  since age 25.  For past 12 years she is working for [employer].  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  Psychosocial History; Patient lives with her 3 children who are 32 year old daughter, 42 and 26-year-old son.  Her parent lives close by.  Legal History; Patient denies any legal issues.  History Of Abuse; Patient denies any history of abuse.  Substance Abuse History; History of substance use and rehabilitation.  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  Neurologic: Headache: No Seizure: No Paresthesias: No   Outpatient Encounter Medications as of 04/11/2022  Medication Sig   aspirin EC 81 MG tablet Take 81 mg by mouth daily. Swallow whole.   Cholecalciferol (VITAMIN D3 SUPER STRENGTH PO) Take 1 capsule by mouth daily.   dicyclomine (BENTYL) 20 MG tablet Take 1 tablet (20 mg total) by mouth 2 (two) times daily.   hydrOXYzine (ATARAX) 25 MG tablet Take 1 tablet (25 mg total) by mouth every  6 (six) hours.   Norgestimate-Ethinyl Estradiol Triphasic (TRI-LO-SPRINTEC) 0.18/0.215/0.25 MG-25 MCG tab Take 1 tablet by mouth daily.   omeprazole (PRILOSEC) 20 MG capsule Take 1 capsule (20 mg total) by mouth daily.   ondansetron (ZOFRAN-ODT) 4 MG disintegrating tablet Take 1 tablet (4 mg total) by mouth every 8 (eight) hours as needed for nausea or vomiting.   sertraline (ZOLOFT) 50 MG tablet Take 1 tablet (50 mg total) by mouth daily.   traZODone (DESYREL) 100 MG tablet Take 1 tablet (100 mg total) by mouth at bedtime.   WEGOVY 1.7 MG/0.75ML SOAJ Inject 1.7 mg into the skin every 7 (seven) days.   No facility-administered encounter medications on file as of 04/11/2022.    Recent Results (from the past 2160 hour(s))  CBC with Differential     Status: None   Collection Time: 03/19/22  5:08 PM  Result Value Ref Range   WBC 9.6 4.0 - 10.5 K/uL   RBC 4.69 3.87 - 5.11 MIL/uL   Hemoglobin 13.1 12.0 - 15.0 g/dL   HCT 16.1 09.6 - 04.5 %   MCV 86.1 80.0  - 100.0 fL   MCH 27.9 26.0 - 34.0 pg   MCHC 32.4 30.0 - 36.0 g/dL   RDW 40.9 81.1 - 91.4 %   Platelets 311 150 - 400 K/uL   nRBC 0.0 0.0 - 0.2 %   Neutrophils Relative % 66 %   Neutro Abs 6.3 1.7 - 7.7 K/uL   Lymphocytes Relative 29 %   Lymphs Abs 2.8 0.7 - 4.0 K/uL   Monocytes Relative 5 %   Monocytes Absolute 0.5 0.1 - 1.0 K/uL   Eosinophils Relative 0 %   Eosinophils Absolute 0.0 0.0 - 0.5 K/uL   Basophils Relative 0 %   Basophils Absolute 0.0 0.0 - 0.1 K/uL   Immature Granulocytes 0 %   Abs Immature Granulocytes 0.02 0.00 - 0.07 K/uL    Comment: Performed at [facility], [address], [place], [place] [address]  Comprehensive metabolic panel     Status: Abnormal   Collection Time: 03/19/22  5:08 PM  Result Value Ref Range   Sodium 137 135 - 145 mmol/L   Potassium 3.9 3.5 - 5.1 mmol/L   Chloride 101 98 - 111 mmol/L   CO2 24 22 - 32 mmol/L   Glucose, Bld 84 70 - 99 mg/dL    Comment: Glucose reference range applies only to samples taken after fasting for at least 8 hours.   BUN 28 (H) 6 - 20 mg/dL   Creatinine, Ser 6.21 (H) 0.44 - 1.00 mg/dL   Calcium 9.4 8.9 - 30.8 mg/dL   Total Protein 9.3 (H) 6.5 - 8.1 g/dL   Albumin 3.9 3.5 - 5.0 g/dL   AST 29 15 - 41 U/L   ALT 19 0 - 44 U/L   Alkaline Phosphatase 89 38 - 126 U/L   Total Bilirubin 0.9 0.3 - 1.2 mg/dL   GFR, Estimated >65 >78 mL/min    Comment: (NOTE) Calculated using the CKD-EPI Creatinine Equation (2021)    Anion gap 12 5 - 15    Comment: Performed at [facility], [address] [place], [place] [address]  Basic metabolic panel     Status: Abnormal   Collection Time: 03/20/22  6:13 PM  Result Value Ref Range   Sodium 138 135 - 145 mmol/L   Potassium 4.0 3.5 - 5.1 mmol/L   Chloride 106 98 - 111 mmol/L  CO2 17 (L) 22 - 32 mmol/L   Glucose, Bld 98 70 - 99 mg/dL    Comment: Glucose reference range applies only to samples taken after fasting for at least 8 hours.   BUN 19 6 - 20  mg/dL   Creatinine, Ser 1.61 0.44 - 1.00 mg/dL   Calcium 9.4 8.9 - 09.6 mg/dL   GFR, Estimated >04 >54 mL/min    Comment: (NOTE) Calculated using the CKD-EPI Creatinine Equation (2021)    Anion gap 15 5 - 15    Comment: Performed at [facility], [address], [place], [place] [address]  CBC     Status: None   Collection Time: 03/20/22  6:13 PM  Result Value Ref Range   WBC 8.5 4.0 - 10.5 K/uL   RBC 4.65 3.87 - 5.11 MIL/uL   Hemoglobin 13.0 12.0 - 15.0 g/dL   HCT 91.4 78.2 - 95.6 %   MCV 86.0 80.0 - 100.0 fL   MCH 28.0 26.0 - 34.0 pg   MCHC 32.5 30.0 - 36.0 g/dL   RDW 21.3 08.6 - 57.8 %   Platelets 314 150 - 400 K/uL   nRBC 0.0 0.0 - 0.2 %    Comment: Performed at [facility], [address] [place], [place] [address]  Troponin I (High Sensitivity)     Status: None   Collection Time: 03/20/22  6:13 PM  Result Value Ref Range   Troponin I (High Sensitivity) 4 <18 ng/L    Comment: (NOTE) Elevated high sensitivity troponin I (hsTnI) values and significant  changes across serial measurements may suggest ACS but many other  chronic and acute conditions are known to elevate hsTnI results.  Refer to the "Links" section for chest pain algorithms and additional  guidance. Performed at [facility], [address], [place], [place] [address]   Lipase, blood     Status: Abnormal   Collection Time: 03/20/22  6:13 PM  Result Value Ref Range   Lipase 61 (H) 11 - 51 U/L    Comment: Performed at [facility], [address], [place], [place] [address]  I-Stat beta hCG blood, ED     Status: None   Collection Time: 03/20/22  6:26 PM  Result Value Ref Range   I-stat hCG, quantitative <5.0 <5 mIU/mL   Comment 3            Comment:   GEST. AGE      CONC.  (mIU/mL)   <=1 WEEK        5 - 50     2 WEEKS       50 - 500     3 WEEKS       100 - 10,000     4 WEEKS     1,000 - 30,000        FEMALE AND NON-PREGNANT FEMALE:     LESS THAN 5 mIU/mL   Troponin I (High  Sensitivity)     Status: None   Collection Time: 03/20/22  8:27 PM  Result Value Ref Range   Troponin I (High Sensitivity) 4 <18 ng/L    Comment: (NOTE) Elevated high sensitivity troponin I (hsTnI) values and significant  changes across serial measurements may suggest ACS but many other  chronic and acute conditions are known to elevate hsTnI results.  Refer to the "Links" section for chest pain algorithms and additional  guidance. Performed at [facility], [address] [place], [place] [address]   Hepatic function panel     Status: Abnormal   Collection Time: 03/20/22  8:27 PM  Result Value Ref Range   Total Protein 8.7 (H) 6.5 - 8.1 g/dL   Albumin 3.8 3.5 - 5.0 g/dL   AST 37 15 - 41 U/L   ALT 23 0 - 44 U/L   Alkaline Phosphatase 79 38 - 126 U/L   Total Bilirubin 1.2 0.3 - 1.2 mg/dL   Bilirubin, Direct 0.5 (H) 0.0 - 0.2 mg/dL   Indirect Bilirubin 0.7 0.3 - 0.9 mg/dL    Comment: Performed at [facility], [address], [place], [place] [address]  Urinalysis, Routine w reflex microscopic Urine, Clean Catch     Status: Abnormal   Collection Time: 03/20/22  8:42 PM  Result Value Ref Range   Color, Urine YELLOW YELLOW   APPearance HAZY (A) CLEAR   Specific Gravity, Urine >1.030 (H) 1.005 - 1.030   pH 6.0 5.0 - 8.0   Glucose, UA NEGATIVE NEGATIVE mg/dL   Hgb urine dipstick LARGE (A) NEGATIVE   Bilirubin Urine SMALL (A) NEGATIVE   Ketones, ur >80 (A) NEGATIVE mg/dL   Protein, ur 277 (A) NEGATIVE mg/dL   Nitrite NEGATIVE NEGATIVE   Leukocytes,Ua NEGATIVE NEGATIVE    Comment: Performed at [facility], [address], [place], [place] [address]  Urine rapid drug screen (hosp performed)     Status: None   Collection Time: 03/20/22  8:42 PM  Result Value Ref Range   Opiates NONE DETECTED NONE DETECTED   Cocaine NONE DETECTED NONE DETECTED   Benzodiazepines NONE DETECTED NONE DETECTED   Amphetamines NONE DETECTED NONE DETECTED   Tetrahydrocannabinol NONE  DETECTED NONE DETECTED   Barbiturates NONE DETECTED NONE DETECTED    Comment: (NOTE) DRUG SCREEN FOR MEDICAL PURPOSES ONLY.  IF CONFIRMATION IS NEEDED FOR ANY PURPOSE, NOTIFY LAB WITHIN 5 DAYS.  LOWEST DETECTABLE LIMITS FOR URINE DRUG SCREEN Drug Class                     Cutoff (ng/mL) Amphetamine and metabolites    1000 Barbiturate and metabolites    200 Benzodiazepine                 200 Tricyclics and metabolites     300 Opiates and metabolites        300 Cocaine and metabolites        300 THC                            50 Performed at [facility], [address] [place], [place] [address]   Urinalysis, Microscopic (reflex)     Status: Abnormal   Collection Time: 03/20/22  8:42 PM  Result Value Ref Range   RBC / HPF 11-20 0 - 5 RBC/hpf   WBC, UA 0-5 0 - 5 WBC/hpf   Bacteria, UA FEW (A) NONE SEEN   Squamous Epithelial / LPF 0-5 0 - 5   Mucus PRESENT     Comment: Performed at [facility], [address] [place], [place] [address]  Lactic acid, plasma     Status: None   Collection Time: 03/20/22 10:36 PM  Result Value Ref Range   Lactic Acid, Venous 1.4 0.5 - 1.9 mmol/L    Comment: Performed at [facility], [address], [place], [place] [address]  Resp Panel by RT-PCR (Flu A&B, Covid) Anterior Nasal Swab     Status: None   Collection Time: 03/24/22  6:29 AM   Specimen: Anterior Nasal Swab  Result Value Ref Range   SARS Coronavirus 2 by RT PCR NEGATIVE NEGATIVE    Comment: (NOTE) SARS-CoV-2 target nucleic acids are NOT DETECTED.  The SARS-CoV-2 RNA is generally detectable in upper respiratory specimens during the acute phase of infection. The lowest concentration of SARS-CoV-2 viral copies this assay can detect is 138 copies/mL. A negative result does not preclude SARS-Cov-2 infection and should not be used as the sole basis for treatment or other patient management decisions. A negative result may occur with  improper specimen  collection/handling, submission of specimen other than nasopharyngeal swab, presence of viral mutation(s) within the areas targeted by this assay, and inadequate number of viral copies(<138 copies/mL). A negative result must be combined with clinical observations, patient history, and epidemiological information. The expected result is Negative.  Fact Sheet for Patients:  BloggerCourse.com  Fact Sheet for Healthcare Providers:  SeriousBroker.it  This test is no t yet approved or cleared by the [place] FDA and  has been authorized for detection and/or diagnosis of SARS-CoV-2 by FDA under an Emergency Use Authorization (EUA). This EUA will remain  in effect (meaning this test can be used) for the duration of the COVID-19 declaration under Section 564(b)(1) of the Act, 21 U.S.C.section 360bbb-3(b)(1), unless the authorization is terminated  or revoked sooner.       Influenza A by PCR NEGATIVE NEGATIVE   Influenza B by PCR NEGATIVE NEGATIVE    Comment: (NOTE) The Xpert Xpress SARS-CoV-2/FLU/RSV plus assay is intended as an aid in the diagnosis of influenza from Nasopharyngeal swab specimens and should not be used as a sole basis for treatment. Nasal washings and aspirates are unacceptable for Xpert Xpress SARS-CoV-2/FLU/RSV testing.  Fact Sheet for Patients: BloggerCourse.com  Fact Sheet for Healthcare Providers: SeriousBroker.it  This test is not yet approved or cleared by the [place] FDA and has been authorized for detection and/or diagnosis of SARS-CoV-2 by FDA under an Emergency Use Authorization (EUA). This EUA will remain in effect (meaning this test can be used) for the duration of the COVID-19 declaration under Section 564(b)(1) of the Act, 21 U.S.C. section 360bbb-3(b)(1), unless the authorization is terminated or revoked.  Performed at [facility]  Lab, [address] [place], [place] [address]   CBC with Differential/Platelet     Status: Abnormal   Collection Time: 03/24/22  6:29 AM  Result Value Ref Range   WBC 14.7 (H) 4.0 - 10.5 K/uL   RBC 4.96 3.87 - 5.11 MIL/uL   Hemoglobin 14.0 12.0 - 15.0 g/dL   HCT 63.8 93.7 - 34.2 %   MCV 85.3 80.0 - 100.0 fL   MCH 28.2 26.0 - 34.0 pg   MCHC 33.1 30.0 - 36.0 g/dL   RDW 87.6 81.1 - 57.2 %   Platelets 325 150 - 400 K/uL   nRBC 0.0 0.0 - 0.2 %   Neutrophils Relative % 62 %   Neutro Abs 9.0 (H) 1.7 - 7.7 K/uL   Lymphocytes Relative 32 %   Lymphs Abs 4.7 (H) 0.7 - 4.0 K/uL   Monocytes Relative 6 %   Monocytes Absolute 0.8 0.1 - 1.0 K/uL   Eosinophils Relative 0 %   Eosinophils Absolute 0.1 0.0 - 0.5 K/uL   Basophils Relative 0 %   Basophils Absolute 0.0 0.0 - 0.1 K/uL  Immature Granulocytes 0 %   Abs Immature Granulocytes 0.05 0.00 - 0.07 K/uL    Comment: Performed at [facility], [address] [place], [place] [address]  Comprehensive metabolic panel     Status: Abnormal   Collection Time: 03/24/22  6:29 AM  Result Value Ref Range   Sodium 135 135 - 145 mmol/L   Potassium 3.6 3.5 - 5.1 mmol/L   Chloride 98 98 - 111 mmol/L   CO2 20 (L) 22 - 32 mmol/L   Glucose, Bld 78 70 - 99 mg/dL    Comment: Glucose reference range applies only to samples taken after fasting for at least 8 hours.   BUN 20 6 - 20 mg/dL   Creatinine, Ser 6.04 0.44 - 1.00 mg/dL   Calcium 9.5 8.9 - 54.0 mg/dL   Total Protein 8.9 (H) 6.5 - 8.1 g/dL   Albumin 3.8 3.5 - 5.0 g/dL   AST 71 (H) 15 - 41 U/L   ALT 61 (H) 0 - 44 U/L   Alkaline Phosphatase 78 38 - 126 U/L   Total Bilirubin 1.1 0.3 - 1.2 mg/dL   GFR, Estimated >98 >11 mL/min    Comment: (NOTE) Calculated using the CKD-EPI Creatinine Equation (2021)    Anion gap 17 (H) 5 - 15    Comment: Performed at [facility], [address] [place], [place] [address]  Hemoglobin A1c     Status: None   Collection Time: 03/24/22  6:29 AM  Result  Value Ref Range   Hgb A1c MFr Bld 5.2 4.8 - 5.6 %    Comment: (NOTE) Pre diabetes:          5.7%-6.4%  Diabetes:              >6.4%  Glycemic control for   <7.0% adults with diabetes    Mean Plasma Glucose 102.54 mg/dL    Comment: Performed at [facility], [address], [place], [place] [address]  TSH     Status: None   Collection Time: 03/24/22  6:29 AM  Result Value Ref Range   TSH 2.107 0.350 - 4.500 uIU/mL    Comment: Performed by a 3rd Generation assay with a functional sensitivity of <=0.01 uIU/mL. Performed at [facility], [address] [place], [place] [address]   Lipid panel     Status: Abnormal   Collection Time: 03/24/22  6:29 AM  Result Value Ref Range   Cholesterol 297 (H) 0 - 200 mg/dL   Triglycerides 578 <469 mg/dL   HDL 66 >62 mg/dL   Total CHOL/HDL Ratio 4.5 RATIO   VLDL 23 0 - 40 mg/dL   LDL Cholesterol 952 (H) 0 - 99 mg/dL    Comment:        Total Cholesterol/HDL:CHD Risk Coronary Heart Disease Risk Table                     Men   Women  1/2 Average Risk   3.4   3.3  Average Risk       5.0   4.4  2 X Average Risk   9.6   7.1  3 X Average Risk  23.4   11.0        Use the calculated Patient Ratio above and the CHD Risk Table to determine the patient's CHD Risk.        ATP III CLASSIFICATION (LDL):  <100     mg/dL  Optimal  100-129  mg/dL   Near or Above                    Optimal  130-159  mg/dL   Borderline  161-096  mg/dL   High  >045     mg/dL   Very High Performed at [facility], [address], [place], [place] [address]       Constitutional:  LMP 03/12/2022 (Approximate)    Musculoskeletal: Strength & Muscle Tone: decreased and weakness and right hand Gait & Station: unsteady, walk with the help of wall to support Patient leans: N/A  Psychiatric Specialty Exam: Physical Exam  ROS  Last menstrual period 03/12/2022.There is no height or weight on file to calculate BMI.  General Appearance: Casual  Eye  Contact:  Fair  Speech:   Not fluent.  Volume:  Decreased  Mood:  Anxious and Dysphoric  Affect:  Constricted  Thought Process:  Goal Directed  Orientation:  Full (Time, Place, and Person)  Thought Content:  Rumination  Suicidal Thoughts:  No  Homicidal Thoughts:  No  Memory:  Immediate;   Good Recent;   Good Remote;   Good  Judgement:  Intact  Insight:  Present  Psychomotor Activity:  Restlessness and Tremor  Concentration:  Concentration: Fair and Attention Span: Fair  Recall:  Good  Fund of Knowledge:  Good  Language:  Good  Akathisia:  No  Handed:  Right  AIMS (if indicated):     Assets:  Communication Skills Desire for Improvement Housing Social Support Talents/Skills  ADL's:  Intact  Cognition:  WNL  Sleep:   ok     Assessment/Plan:  Patient is 41 year old American female who was recently discharged from [facility].  Her current medicines are trazodone 100 mg at bedtime, Requip 0.5 mg daily, gabapentin 300 mg 3 times a day, Zoloft 50 mg daily and primidone 100 mg at bedtime.  I reviewed notes, history, collateral, blood work results.  I do believe patient need a neurology opinion as still having tremors, coordination, restlessness, gait problems and weakness in her right hand.  This could be due to side effects of weight loss medication.  Overall she feels depression and anxiety is slowly and gradually getting better.  She like sertraline and trazodone.  I encouraged should consider seeing a neurology and patient has appointment coming up tomorrow to see her primary care provider.  Patient at this time not able to go back to work due to a lot of physical symptoms and psychiatric symptoms.  Her job requires traveling and at this time she is not able to drive and her daughter taking her to the doctor's appointment.  She has some difficulty in fine motor skills as her hand keeps shaking a lot.  She also need to see a therapist to help her coping skills.  We will refer  her to see a therapist.  I will continue trazodone 100 mg at bedtime, Zoloft 50 mg daily.  She need to see a neurologist or primary care physician for the continuation of primidone, Requip and gabapentin.  I explained that we will consider slowly and gradually lowering the dose of medication in the future once her symptoms continue to improve.  This multiple psychotropic medication can cause serotonin and worsening of tremors.  Discussed safety concerns and anytime having active suicidal thoughts or homicidal halogen need to call 911 or go to local emergency room.  Follow-up in 3 weeks.  Patient  will contact our office once she had more information about FMLA/short-term disability.  I also encouraged should consider PCP/neurology for recommendation about going back to work.    Cleotis Nipper, MD 04/11/2022    Follow Up Instructions: I discussed the assessment and treatment plan with the patient. The patient was provided an opportunity to ask questions and all were answered. The patient agreed with the plan and demonstrated an understanding of the instructions.   The patient was advised to call back or seek an in-person evaluation if the symptoms worsen or if the condition fails to improve as anticipated.   Collaboration of Care: Primary Care Provider AEB notes are available in epic to review.   Patient/Guardian was advised Release of Information must be obtained prior to any record release in order to collaborate their care with an outside provider. Patient/Guardian was advised if they have not already done so to contact the registration department to sign all necessary forms in order for [place] to release information regarding their care.    Consent: Patient/Guardian gives verbal consent for treatment and assignment of benefits for services provided during this visit. Patient/Guardian expressed understanding and agreed to proceed.     I provided 78 minutes of non-face-to-face time during this  encounter.

## 2022-04-12 ENCOUNTER — Telehealth (INDEPENDENT_AMBULATORY_CARE_PROVIDER_SITE_OTHER): Payer: 59 | Admitting: Internal Medicine

## 2022-04-12 ENCOUNTER — Encounter: Payer: Self-pay | Admitting: Internal Medicine

## 2022-04-12 VITALS — BP 128/82 | HR 94 | Temp 96.9°F | Wt 234.0 lb

## 2022-04-12 DIAGNOSIS — M533 Sacrococcygeal disorders, not elsewhere classified: Secondary | ICD-10-CM | POA: Diagnosis not present

## 2022-04-12 DIAGNOSIS — F419 Anxiety disorder, unspecified: Secondary | ICD-10-CM | POA: Diagnosis not present

## 2022-04-12 DIAGNOSIS — T50905A Adverse effect of unspecified drugs, medicaments and biological substances, initial encounter: Secondary | ICD-10-CM

## 2022-04-12 DIAGNOSIS — R4701 Aphasia: Secondary | ICD-10-CM

## 2022-04-12 DIAGNOSIS — Z0289 Encounter for other administrative examinations: Secondary | ICD-10-CM

## 2022-04-12 DIAGNOSIS — R45851 Suicidal ideations: Secondary | ICD-10-CM

## 2022-04-12 DIAGNOSIS — F32A Depression, unspecified: Secondary | ICD-10-CM

## 2022-04-12 NOTE — Progress Notes (Signed)
Subjective:    Patient ID: [name], female    DOB: 1981-01-14, 41 y.o.   MRN: [id number]  HPI  Patient presents to the clinic today for multiple ER visits and hospital follow-up.  She initially presented to urgent care 7/10 with complaint of nausea, vomiting and poor appetite after being given the wrong dose of Wegovy by the pharmacy.  Labs at that time were remarkable for a creatinine of 1.17.  She was given a liter of fluids and Zofran with some improvement in symptoms.  She did report anxiety related to her physical symptoms and so she was discharged with prescriptions for Hydroxyzine and Zofran.   She then presented to the ER 7/11 with complaint of left upper quadrant pain and chest pain.  Her lipase was slightly elevated.  Her ECG was normal. Her chest x-ray was normal.  Her CT abdomen/pelvis was negative for any acute findings.  She was treated with IV fluids, Dilaudid and Reglan with improvement in her symptoms.  She was discharged with Rx for Bentyl, Omeprazole and Zofran and advised to follow-up with her PCP.  She presented back to urgent care 7/15 with complaint of anxiety, depression and SI.  She had reported that she was going to "cut her wrist" while her children were asleep.  She was obviously restless and agitated.  She ended up being admitted to [facility] behavioral from 7/17 to 7/24.  She was treated with Sertraline, Trazodone, Gabapentin, RopinirolePrimidone.  Since discharge, she did see psychiatry yesterday.  She did report that her anxiety and depression were getting better but she seemed frustrated by the fact that she was unable to get back to work due to persistent symptoms of weakness, restlessness, tremors and aphasia.  Psychiatry reach out to me recommending neurology referral.  At this time, patient reports she is sleeping well.  She feels rested when she wakes up and she is not napping during the day.  She denies headaches, dizziness but has seen some floaters.   Her biggest concern is her difficulty with speech.  She reports she is not having difficulty thinking but it is not coming out appropriately.  Her appetite is good.  She denies nausea, vomiting, constipation, diarrhea.  She denies any weakness or numbness but does have some intermittent tingling in her hand.  She denies tremor, confusion.  She reports she has not had any additional SI or HI.  She reports she is managing things at home by herself, with some help from her children.  She reports her ex-husband is checking in on the kids from time to time.   Review of Systems     Past Medical History:  Diagnosis Date   Abnormal Pap smear Jan 27, 2010   Asthma    "out grew it" no inhaler   Dysplasia of cervix, low grade (CIN 1)    Generalized anxiety disorder 12/23/2014   H/O cystitis 01-27-01   H/O pyelonephritis 01/27/01   H/O rubella    H/O varicella    History of cesarean delivery April 10, 2012   01/28/2011 for breech    History of chlamydia 12/27/2000   History of induced abortion April 10, 2012   x2    History of PID 03/29/2006   Monilial vaginitis 08/2006   Obese    Pain pelvic 01-27-2001   Unexpected sudden death of infant 04/10/2012   01-28-2011, SIDS at 30mos     Vaginal bleeding in pregnancy 28-Jan-2012    Current Outpatient Medications  Medication Sig Dispense Refill  aspirin EC 81 MG tablet Take 81 mg by mouth daily. Swallow whole.     Cholecalciferol (VITAMIN D3 SUPER STRENGTH PO) Take 1 capsule by mouth daily.     dicyclomine (BENTYL) 20 MG tablet Take 1 tablet (20 mg total) by mouth 2 (two) times daily. 20 tablet 0   gabapentin (NEURONTIN) 300 MG capsule Take 300 mg by mouth 3 (three) times daily.     Norgestimate-Ethinyl Estradiol Triphasic (TRI-LO-SPRINTEC) 0.18/0.215/0.25 MG-25 MCG tab Take 1 tablet by mouth daily. 28 tablet 5   primidone (MYSOLINE) 50 MG tablet Take 100 mg by mouth at bedtime.     rOPINIRole (REQUIP) 0.5 MG tablet Take 0.5 mg by mouth 2 (two) times daily.     sertraline (ZOLOFT) 50 MG tablet Take 1  tablet (50 mg total) by mouth daily. 30 tablet 0   traZODone (DESYREL) 100 MG tablet Take 1 tablet (100 mg total) by mouth at bedtime. 30 tablet 0   hydrOXYzine (ATARAX) 25 MG tablet Take 1 tablet (25 mg total) by mouth every 6 (six) hours. (Patient not taking: Reported on 04/12/2022) 25 tablet 0   No current facility-administered medications for this visit.    No Known Allergies  Family History  Problem Relation Age of Onset   Cancer Maternal Grandmother        cervical   Diabetes Paternal Grandmother    Heart disease Maternal Grandfather    Hyperlipidemia Maternal Grandfather    Hypertension Maternal Grandfather    Other Neg Hx     Social History   Socioeconomic History   Marital status: Single    Spouse name: Not on file   Number of children: Not on file   Years of education: Not on file   Highest education level: Not on file  Occupational History   Not on file  Tobacco Use   Smoking status: Former   Smokeless tobacco: Never  Vaping Use   Vaping Use: Never used  Substance and Sexual Activity   Alcohol use: Not Currently    Alcohol/week: 0.0 standard drinks of alcohol   Drug use: Never   Sexual activity: Not Currently    Birth control/protection: Pill  Other Topics Concern   Not on file  Social History Narrative   Not on file   Social Determinants of Health   Financial Resource Strain: Not on file  Food Insecurity: Not on file  Transportation Needs: Not on file  Physical Activity: Not on file  Stress: Not on file  Social Connections: Not on file  Intimate Partner Violence: Not on file     Constitutional: Denies fever, malaise, fatigue, headache or abrupt weight changes.  HEENT: Patient reports seeing floaters.  Denies eye pain, eye redness, ear pain, ringing in the ears, wax buildup, runny nose, nasal congestion, bloody nose, or sore throat. Respiratory: Denies difficulty breathing, shortness of breath, cough or sputum production.   Cardiovascular: Denies  chest pain, chest tightness, palpitations or swelling in the hands or feet.  Gastrointestinal: Denies abdominal pain, bloating, constipation, diarrhea or blood in the stool.  GU: Denies urgency, frequency, pain with urination, burning sensation, blood in urine, odor or discharge. Musculoskeletal: Denies decrease in range of motion, difficulty with gait, muscle pain or joint pain and swelling.  Skin: Denies redness, rashes, lesions or ulcercations.  Neurological: Patient reports intermittent tingling in hands.  Denies dizziness, difficulty with memory, difficulty with speech or problems with balance and coordination.  Psych: Patient reports anxiety and depression.  Denies SI/HI.  No other specific complaints in a complete review of systems (except as listed in HPI above).  Objective:   Physical Exam   BP 128/82 (BP Location: Right Arm, Patient Position: Sitting, Cuff Size: Normal)   Pulse 94   Temp (!) 96.9 F (36.1 C) (Temporal)   Wt 234 lb (106.1 kg)   LMP 03/12/2022 (Approximate)   SpO2 100%   BMI 40.17 kg/m  Wt Readings from Last 3 Encounters:  04/12/22 234 lb (106.1 kg)  04/11/22 230 lb (104.3 kg)  03/20/22 250 lb (113.4 kg)    General: Appears her stated age, obese, in NAD. Skin: Warm, dry and intact.  No bruising or abrasions noted. HEENT: Head: normal shape and size; Eyes: sclera white, no icterus, conjunctiva pink, PERRLA and EOMs intact;  Neck:  Neck supple, trachea midline. No masses, lumps or thyromegaly present.  Cardiovascular: Normal rate and rhythm. S1,S2 noted.  No murmur, rubs or gallops noted. No JVD or BLE edema. Pulmonary/Chest: Normal effort and positive vesicular breath sounds. No respiratory distress. No wheezes, rales or ronchi noted.  Musculoskeletal: Strength 5/5 BUE/BLE.  Able to tandem walk.  No difficulty with gait.  Neurological: Alert and oriented with obvious expressive aphasia. Cranial nerves II-XII grossly intact.  Gross coordination normal.  Fine  motor coordination slightly slowed. Psychiatric: Her affect is flat.  Her behavior appears normal.  She seems obsessed with getting her FMLA forms completed so that we will pay her so that she can take care of her bills.  No evidence of racing or abnormal thoughts.   BMET    Component Value Date/Time   NA 135 03/24/2022 0629   K 3.6 03/24/2022 0629   CL 98 03/24/2022 0629   CO2 20 (L) 03/24/2022 0629   GLUCOSE 78 03/24/2022 0629   BUN 20 03/24/2022 0629   CREATININE 0.94 03/24/2022 0629   CREATININE 0.95 08/26/2018 1126   CALCIUM 9.5 03/24/2022 0629   GFRNONAA >60 03/24/2022 0629   GFRNONAA 77 08/26/2018 1126   GFRAA 89 08/26/2018 1126    Lipid Panel     Component Value Date/Time   CHOL 297 (H) 03/24/2022 0629   TRIG 116 03/24/2022 0629   HDL 66 03/24/2022 0629   CHOLHDL 4.5 03/24/2022 0629   VLDL 23 03/24/2022 0629   LDLCALC 208 (H) 03/24/2022 0629   LDLCALC 146 (H) 08/26/2018 1126    CBC    Component Value Date/Time   WBC 14.7 (H) 03/24/2022 0629   RBC 4.96 03/24/2022 0629   HGB 14.0 03/24/2022 0629   HCT 42.3 03/24/2022 0629   PLT 325 03/24/2022 0629   MCV 85.3 03/24/2022 0629   MCH 28.2 03/24/2022 0629   MCHC 33.1 03/24/2022 0629   RDW 13.3 03/24/2022 0629   LYMPHSABS 4.7 (H) 03/24/2022 0629   MONOABS 0.8 03/24/2022 0629   EOSABS 0.1 03/24/2022 0629   BASOSABS 0.0 03/24/2022 0629    Hgb A1C Lab Results  Component Value Date   HGBA1C 5.2 03/24/2022        Assessment & Plan:   Hospital Follow Up for Medication Reaction, Anxiety, Depression, SI, Encounter for Form Completion with Patient:  ER/Hospital notes, labs and imaging reviewed Psychiatry notes reviewed MRI brain ordered Referral to neurology for further evaluation Referral to speech therapy for further evaluation Encouraged her to continue to see psychiatry She asked me about which meds she should be taking.  My recommendation is that she take the sertraline and the trazodone and do not  take anything  else that she does not need.  I would recommend that she stop primidone, ropinirole and gabapentin at this time FMLA forms completed  RTC in 3 months, follow-up chronic conditions Nicki Reaper, NP

## 2022-04-18 ENCOUNTER — Ambulatory Visit: Payer: 59 | Attending: Internal Medicine

## 2022-04-18 DIAGNOSIS — R482 Apraxia: Secondary | ICD-10-CM | POA: Diagnosis present

## 2022-04-18 DIAGNOSIS — R4701 Aphasia: Secondary | ICD-10-CM | POA: Diagnosis present

## 2022-04-18 DIAGNOSIS — R41841 Cognitive communication deficit: Secondary | ICD-10-CM | POA: Insufficient documentation

## 2022-04-18 NOTE — Therapy (Unsigned)
OUTPATIENT SPEECH LANGUAGE PATHOLOGY EVALUATION   Patient Name: [name] MRN: [id number] DOB:June 03, 1981, 41 y.o., female Today's Date: 04/18/2022  PCP: Lorre Munroe, NP  REFERRING PROVIDER: Lorre Munroe, NP    End of Session - 04/18/22 1539     Visit Number 1    Number of Visits 13    Date for SLP Re-Evaluation 06/01/22    Authorization Type [employer]    SLP Start Time 1101    SLP Stop Time  1148    SLP Time Calculation (min) 47 min    Activity Tolerance Patient tolerated treatment well             Past Medical History:  Diagnosis Date   Abnormal Pap smear 01-13-10   Asthma    "out grew it" no inhaler   Dysplasia of cervix, low grade (CIN 1)    Generalized anxiety disorder 12/23/2014   H/O cystitis 2001/01/13   H/O pyelonephritis 13-Jan-2001   H/O rubella    H/O varicella    History of cesarean delivery 2012-04-12   Jan 14, 2011 for breech    History of chlamydia 12/27/2000   History of induced abortion 04/12/12   x2    History of PID 03/29/2006   Monilial vaginitis 08/2006   Obese    Pain pelvic 2001/01/13   Unexpected sudden death of infant 12-Apr-2012   Jan 14, 2011, SIDS at 30mos     Vaginal bleeding in pregnancy 2012-01-14   Past Surgical History:  Procedure Laterality Date   bilateral tubal ligation     CESAREAN SECTION     CESAREAN SECTION  Aug 07, 2012   Procedure: CESAREAN SECTION;  Surgeon: Kirkland Hun, MD;  Location: [facility];  Service: Obstetrics;  Laterality: N/A;   CYSTOSCOPY  08/07/12   Procedure: CYSTOSCOPY;  Surgeon: Kirkland Hun, MD;  Location: [facility];  Service: Obstetrics;  Laterality: N/A;   headaches  2012/01/14    during pregnancy   Patient Active Problem List   Diagnosis Date Noted   Prediabetes 09/25/2021   Insomnia 09/25/2021   Hyperlipidemia 08/17/2015   Generalized anxiety disorder 12/23/2014   Morbid obesity (HCC)    Asthma 12-Apr-2012    ONSET DATE: July 2023   REFERRING DIAG:  R47.01 (ICD-10-CM) - Aphasia  T50.905A (ICD-10-CM) - Adverse effect of drug, initial  encounter   THERAPY DIAG:  Aphasia  Verbal apraxia  Rationale for Evaluation and Treatment Rehabilitation  SUBJECTIVE:   SUBJECTIVE STATEMENT: "It's hard for me to get it out of here (points at head)"  Pt accompanied by: self  PERTINENT HISTORY: Anxiety, depression  PAIN: Are you having pain? No  FALLS: Has patient fallen in last 6 months?  No  LIVING ENVIRONMENT: Lives with: lives with their family Lives in: House/apartment  PLOF:  Level of assistance: Independent with ADLs, Independent with IADLs Employment: Full-time employment   PATIENT GOALS to improve communication and return to work   OBJECTIVE:   COGNITION: Overall cognitive status: Impaired Areas of impairment:  Memory: Impaired: Short term Functional deficits: Pt reported changes in thinking, including delayed processing, forgetting usual items, and not feeling as sharp.   AUDITORY COMPREHENSION: Overall auditory comprehension: Appears intact YES/NO questions: Impaired: complex Following directions: Appears intact Conversation: Moderately Complex Interfering components: anxiety, processing speed, and working memory Effective technique: repetition/stressing words  READING COMPREHENSION: Intact  EXPRESSION: verbal  VERBAL EXPRESSION: Level of generative/spontaneous verbalization: conversation Automatic speech: WFL  Repetition: Appears intact  Naming: Confrontation: 51-75% Pragmatics: Appears intact Interfering components: attention and anxiety Effective  technique: phonemic cues Non-verbal means of communication: N/A Comments: Pt presents with halting speech impacting fluency, intermittent groping for words, and some reduced articulatory precision. Pt c/o anomia and dysnomia, which was noted at least x1 in conversation. Overt frustration exhibited, which increased dsyfluency of speech. Increased naming difficulty exhibited during structured tasks, which could be in part contributed to performance  pressure.   WRITTEN EXPRESSION: Dominant hand: right  Written expression: Appears intact  MOTOR SPEECH: Overall motor speech: impaired Level of impairment: Conversation Respiration: thoracic breathing Phonation: normal Resonance: WFL Articulation: Impaired: conversation Intelligibility: Intelligibility reduced Motor planning: Appears intact Motor speech errors: inconsistent Interfering components:  possibly impacted by anxiety  Effective technique: slow rate and pacing  ORAL MOTOR EXAMINATION Overall status: WFL  STANDARDIZED ASSESSMENTS: QAB: Moderate (7.34)   PATIENT REPORTED OUTCOME MEASURES (PROM): Communication Participation Item Bank: 3 "very much" for all categories except trying to persuade someone    TODAY'S TREATMENT:  ***   PATIENT EDUCATION: Education details: see above Person educated: Patient Education method: Explanation, Demonstration, and Handouts Education comprehension: verbalized understanding, returned demonstration, and needs further education     GOALS: Goals reviewed with patient? Yes  SHORT TERM GOALS: Target date: 05/16/2022    Pt will utilize word retrieval strategies in structured speech tasks given occasional min A over 2 sessions Baseline: Goal status: INITIAL  2.  Pt will utilize speech strategies to optimize speech fluency in short structured conversation given occasional min A over 2 sessions Baseline:  Goal status: INITIAL  3.  Pt will demonstrate behavioral management of emotions to aid verbal expression given occasional min A over 2 sessions  Baseline:  Goal status: INITIAL  4.  Pt will utilize cognitive compensations to aid recall of pertinent information given occasional min A over 2 sessions  Baseline:  Goal status: INITIAL   LONG TERM GOALS: Target date: 05/30/2022    *** Baseline:  Goal status: {GOALSTATUS:25110}  2.  *** Baseline:  Goal status: {GOALSTATUS:25110}  3.  *** Baseline:  Goal status:  {GOALSTATUS:25110}  4.  *** Baseline:  Goal status: {GOALSTATUS:25110}  5.  *** Baseline:  Goal status: {GOALSTATUS:25110}  6.  *** Baseline:  Goal status: {GOALSTATUS:25110}  ASSESSMENT:  CLINICAL IMPRESSION: Patient is a 41 y.o. female who was seen today for aphasia. Pt reportedly hospitalized in [place] for adverse drug reaction resulting in aphasia. PCP recommended SLP evaluation due to overt changes in speech. Pt denied any prior or current neurological changes (awaiting MRI). Today, pt presents with usual halting and pausing resulting in dysfluent speech, inconsistent groping for words, and some reduced articulatory precision. Overt frustration exhibited with pt demonstrating emotional impact of speech changes, which worsened patient's ability to communicate. Connected speech tasks revealed intermittent vague language, empty speech, and inconsistent grammar. Dysnomia exhibited x`. Anomia only appreciated during standardized testing today, with pt able to describe items x2 but unable to name with semantic or first letter cues. Max phonemic cues required.   OBJECTIVE IMPAIRMENTS include expressive language. These impairments are limiting patient from effectively communicating at home and in community. Factors affecting potential to achieve goals and functional outcome are  unknown etiology . Patient will benefit from skilled SLP services to address above impairments and improve overall function.  REHAB POTENTIAL: Good  PLAN: SLP FREQUENCY: 2x/week  SLP DURATION: 6 weeks  PLANNED INTERVENTIONS: Language facilitation, Cueing hierachy, Internal/external aids, Functional tasks, Multimodal communication approach, SLP instruction and feedback, Compensatory strategies, and Patient/family education    Gracy Racer, CCC-SLP 04/18/2022,  3:39 PM

## 2022-04-18 NOTE — Patient Instructions (Signed)
When you have trouble saying the word you want to say:  1)  Describe it! Describe the size, color, shape, function, composition (what it's made of), and/or location to be able to have the word come sooner, or to have your listener help you out  2) "talk around the word" (say it a totally different way) -get your point out using different words than the one/ones you can't think of  3) Use a synonym - think of another word that means the exact same thing  4) DRAW! You can draw some things you want to say in order to give your listener a hint about what you're talking about  5)  Gesture- make motions to help your listener understand what you are trying to communicate  6) Write down the word, if you can - or the first letter or letters, to help you say the word or to give your listener a hint about what you're trying to say    Describing words  What group does it belong to?  What do I use it for?  Where can I find it?  What does it LOOK like?  What other words go with it?  What is the 1st sound of the word?   Many Ways to Communicate  Describe it Write it Draw it Gesture it Use related words  Resources  [facility] [place] with Dr. Cynda Familia at [facility] - email [email or web address]  Talk[employer] Therapy app by Sherron Flemings on [facility] website National Aphasia Association - naa.aphasia.org Aphasia Recovery Connection - aphasiarecoveryconnection.org Tactus therapy apps [employer]  Podcasts and IG accounts:  [employer] Recovery After Stroke Podcast Stroke Recovery Association Stroke Onward

## 2022-04-22 ENCOUNTER — Ambulatory Visit (HOSPITAL_COMMUNITY)
Admission: RE | Admit: 2022-04-22 | Discharge: 2022-04-22 | Disposition: A | Discharge status: Home / Self Care | Payer: 59 | Source: Ambulatory Visit | Attending: Internal Medicine | Admitting: Internal Medicine

## 2022-04-22 DIAGNOSIS — R4701 Aphasia: Secondary | ICD-10-CM | POA: Diagnosis not present

## 2022-04-22 DIAGNOSIS — T50905A Adverse effect of unspecified drugs, medicaments and biological substances, initial encounter: Secondary | ICD-10-CM | POA: Diagnosis present

## 2022-04-22 MED ORDER — GADOBUTROL 1 MMOL/ML IV SOLN
10.0000 mL | Freq: Once | INTRAVENOUS | Status: AC | PRN
  Administered 2022-04-22: 10 mL via INTRAVENOUS

## 2022-04-25 ENCOUNTER — Encounter: Payer: 59 | Admitting: Speech Pathology

## 2022-05-02 ENCOUNTER — Encounter (HOSPITAL_COMMUNITY): Payer: Self-pay | Admitting: Psychiatry

## 2022-05-02 ENCOUNTER — Telehealth (HOSPITAL_BASED_OUTPATIENT_CLINIC_OR_DEPARTMENT_OTHER): Payer: 59 | Admitting: Psychiatry

## 2022-05-02 DIAGNOSIS — F419 Anxiety disorder, unspecified: Secondary | ICD-10-CM | POA: Diagnosis not present

## 2022-05-02 DIAGNOSIS — F331 Major depressive disorder, recurrent, moderate: Secondary | ICD-10-CM | POA: Diagnosis not present

## 2022-05-02 DIAGNOSIS — F1994 Other psychoactive substance use, unspecified with psychoactive substance-induced mood disorder: Secondary | ICD-10-CM | POA: Diagnosis not present

## 2022-05-02 MED ORDER — TRAZODONE HCL 100 MG PO TABS: 100.0000 mg | ORAL_TABLET | Freq: Every day | ORAL | 1 refills | Status: DC

## 2022-05-02 MED ORDER — SERTRALINE HCL 50 MG PO TABS: 50.0000 mg | ORAL_TABLET | Freq: Every day | ORAL | 1 refills | Status: DC

## 2022-05-02 NOTE — Progress Notes (Signed)
Virtual Visit via Video Note  I connected with [name] on 05/02/22 at  3:00 PM EDT by a video enabled telemedicine application and verified that I am speaking with the correct person using two identifiers.  Location: Patient: In Car Provider: Home Office   I discussed the limitations of evaluation and management by telemedicine and the availability of in person appointments. The patient expressed understanding and agreed to proceed.  History of Present Illness: Patient is evaluated by video session.  She is a 41 year old African-American female who was referred from inpatient [facility] where she was admitted for psychosis, bizarre behavior, extreme restlessness and socially withdrawn.  She was given a higher dose of the goal being injection for weight loss earlier in the emergency room at [facility].  She was discharged on multiple psychotropic medication from [facility] which include hydroxyzine, primidone, Requip, Zoloft and trazodone.  She had stopped the primidone, Requip and hydroxyzine but we have recommended to continue Zoloft and trazodone.  Patient has a lot of physiological symptoms of neurological origin which could be the side effects of higher dose of the [place].  However she feels slowly and gradually the symptoms are better.  She still have speech issues and not fluent but her comprehension is better.  She is scared to drive at night.  She never drives now by herself because she is scared.  Her attention concentration is still an issue.  We have recommended to see neurology and recently she had an MRI and scheduled to see neurology next week.  She also referred to see a speech therapist.  Her hand tremors, gait is improved from the past.  She is not having any more psychosis, hallucination, paranoia.  She still have issues with her coordination and sometimes she gets confused.  Her job requires a lot of traveling and she is not comfortable at this time.  Currently  she is on disability.  She denies any crying spells or any feeling of hopelessness.  She feels slowly and gradually her physiological condition is getting better.  Her motor skills are slowly improving but she is not back to her 100% function.  She denies any panic attack or any crying spells.  She denies any nightmares or flashback.  She is sleeping better with increased trazodone.  Is only taking trazodone and Zoloft and recently started taking aspirin.  Her appetite is okay.  Her weight is stable.  She feels more motivated with the help of medication.  She is not drinking or using any illegal substances.    Past Psychiatric History: H/O ETOH and drug use.  H/O inpatient in 2007 for above reason but no follow-up. Stopped drinking and using drugs 4 years ago after required inpatient rehab. In July 2023 seen in ER after given higher dose of weight loss medication and experienced hallucination, suicidal thoughts, restlessness, tremors and required inpatient at [facility].  She was discharged on Requip, gabapentin, primidone, Zoloft and trazodone. No h/o suicidal attempt.   Psychiatric Specialty Exam: Physical Exam  Review of Systems  Weight 238 lb (108 kg).Body mass index is 40.17 kg/m.  General Appearance: Casual  Eye Contact:  Fair  Speech:   not fluent but better   Volume:  Decreased  Mood:  Anxious  Affect:  Congruent  Thought Process:  Descriptions of Associations: Intact  Orientation:  Full (Time, Place, and Person)  Thought Content:  Rumination  Suicidal Thoughts:  No  Homicidal Thoughts:  No  Memory:  Immediate;   Good Recent;   Fair Remote;   Fair  Judgement:  Intact  Insight:  Present  Psychomotor Activity:  Restlessness  Concentration:  Concentration: Fair and Attention Span: Fair  Recall:  [employer] of Knowledge:  Fair  Language:  Good  Akathisia:  No  Handed:  Right  AIMS (if indicated):     Assets:  Communication Skills Desire for  Improvement Housing Resilience Social Support  ADL's:  Intact  Cognition:  WNL  Sleep:   better      Assessment and Plan: Mood disorder, drug-induced.  Anxiety.  Major depressive disorder, recurrent.  Patient doing better and much improved since the last visit.  She still have residual functional impairment and having not fully motor skills back normal.  She is scared to drive at night.  She is her restlessness but better than before.  Her sleep is improved.  I encouraged to continue the current medication for now and we will consider lower the dose on her next appointment if she continues to do better.  Patient agreed with the plan.  She is going to see neurology next week.  She is not back to work at this time and will stay on FMLA until she is able to comfortably drive.  Continue Zoloft 50 mg daily and trazodone 100 mg at bedtime.  Recommended to call us back if she has any question or any concern.  Encouraged to continue speech therapy.  Follow-up in 2 months.  Follow Up Instructions:    I discussed the assessment and treatment plan with the patient. The patient was provided an opportunity to ask questions and all were answered. The patient agreed with the plan and demonstrated an understanding of the instructions.   The patient was advised to call back or seek an in-person evaluation if the symptoms worsen or if the condition fails to improve as anticipated.  Collaboration of Care: Other provider involved in patient's care AEB notes are available in epic to review.  Patient/Guardian was advised Release of Information must be obtained prior to any record release in order to collaborate their care with an outside provider. Patient/Guardian was advised if they have not already done so to contact the registration department to sign all necessary forms in order for [place] to release information regarding their care.   Consent: Patient/Guardian gives verbal consent for treatment and assignment  of benefits for services provided during this visit. Patient/Guardian expressed understanding and agreed to proceed.    I provided 30 minutes of non-face-to-face time during this encounter.   Cleotis Nipper, MD

## 2022-05-04 ENCOUNTER — Ambulatory Visit: Payer: 59 | Admitting: Speech Pathology

## 2022-05-04 DIAGNOSIS — R4701 Aphasia: Secondary | ICD-10-CM

## 2022-05-04 DIAGNOSIS — R482 Apraxia: Secondary | ICD-10-CM

## 2022-05-04 DIAGNOSIS — R41841 Cognitive communication deficit: Secondary | ICD-10-CM

## 2022-05-04 NOTE — Therapy (Signed)
OUTPATIENT SPEECH LANGUAGE PATHOLOGY TREATMENT   Patient Name: [name] MRN: [id number] DOB:07-19-1981, 41 y.o., female Today's Date: 05/04/2022  PCP: Lorre Munroe, NP  REFERRING PROVIDER: Lorre Munroe, NP    End of Session - 05/04/22 1440     Visit Number 2    Number of Visits 13    Date for SLP Re-Evaluation 06/01/22    Authorization Type [employer]    SLP Start Time 1440    SLP Stop Time  1525    SLP Time Calculation (min) 45 min    Activity Tolerance Patient tolerated treatment well              Past Medical History:  Diagnosis Date   Abnormal Pap smear 31-Dec-2009   Asthma    "out grew it" no inhaler   Dysplasia of cervix, low grade (CIN 1)    Generalized anxiety disorder 12/23/2014   H/O cystitis December 31, 2000   H/O pyelonephritis 2000-12-31   H/O rubella    H/O varicella    History of cesarean delivery 03/31/2012   01/01/2011 for breech    History of chlamydia 12/27/2000   History of induced abortion 03/31/12   x2    History of PID 03/29/2006   Monilial vaginitis 08/2006   Obese    Pain pelvic 12-31-00   Unexpected sudden death of infant 31-Mar-2012   01/01/11, SIDS at 81mos     Vaginal bleeding in pregnancy 01/01/2012   Past Surgical History:  Procedure Laterality Date   bilateral tubal ligation     CESAREAN SECTION     CESAREAN SECTION  07/26/12   Procedure: CESAREAN SECTION;  Surgeon: Kirkland Hun, MD;  Location: [facility];  Service: Obstetrics;  Laterality: N/A;   CYSTOSCOPY  26-Jul-2012   Procedure: CYSTOSCOPY;  Surgeon: Kirkland Hun, MD;  Location: [facility];  Service: Obstetrics;  Laterality: N/A;   headaches  Jan 01, 2012    during pregnancy   Patient Active Problem List   Diagnosis Date Noted   Prediabetes 09/25/2021   Insomnia 09/25/2021   Hyperlipidemia 08/17/2015   Generalized anxiety disorder 12/23/2014   Morbid obesity (HCC)    Asthma 2012-03-31    ONSET DATE: July 2023   REFERRING DIAG:  R47.01 (ICD-10-CM) - Aphasia  T50.905A (ICD-10-CM) - Adverse effect of drug,  initial encounter   THERAPY DIAG:  Aphasia  Verbal apraxia  Cognitive communication deficit  Rationale for Evaluation and Treatment Rehabilitation  SUBJECTIVE:   SUBJECTIVE STATEMENT: "I'm getting better"   PAIN: Are you having pain? No   OBJECTIVE:   TODAY'S TREATMENT:  05-04-22: Pt with usual dysfluency in conversational sample and structured speech tasks. Anomia, paraphasias, and agrammatism observed throughout session. Pt is reporting improvement through implementation of strategies provided at initial evaluation, specifically describing when anomia occurs. Pt primarily using physical strategy to aid in regulation of stress to improve communication efficacy. SLP provided suggestion of mental strategy that pt could use as well. SLP led pt through moderately complex naming tasks. Provided with x3 items from a category, pt able to ID target category in 3/5 trials with usual mod-A. Given category, pt able to name x10, x9, x8 across trials with occasional min-A and usual extended time and SLP encouragement. Benefiting from phonemic and semantic cues this date. Updated HEP.   04-18-22: Educated patient on clinical observations, recommendations for management of emotion increasing communication difficulties, and compensatory strategies to address word retrieval deficits and dysfluency. Handout and brief instruction of anomia instructions provided.  PATIENT EDUCATION: Education details: see above Person educated: Patient Education method: Explanation, Demonstration, and Handouts Education comprehension: verbalized understanding, returned demonstration, and needs further education   GOALS: Goals reviewed with patient? Yes  SHORT TERM GOALS: Target date: 05/16/2022    Pt will utilize word retrieval strategies in structured speech tasks given occasional min A over 2 sessions Baseline: Goal status: IN PROGRESS  2.  Pt will utilize speech strategies to optimize speech fluency in short  structured conversation given occasional min A over 2 sessions Baseline:  Goal status: IN PROGRESS  3.  Pt will demonstrate behavioral management of emotions to aid verbal expression given occasional min A over 2 sessions  Baseline:  Goal status: IN PROGRESS  4.  Pt will utilize cognitive compensations to aid recall of pertinent information given occasional min A over 2 sessions  Baseline:  Goal status: IN PROGRESS   LONG TERM GOALS: Target date: 06/01/2022    Pt will utilize word retrieval strategies in extended conversation given rare min A over 2 sessions Baseline:  Goal status: IN PROGRESS  2.  Pt will utilize speech strategies to optimize speech fluency in extended conversation given rare min A over 2 sessions Baseline:  Goal status: IN PROGRESS  3.  Pt will utilize cognitive compensations to aid recall of pertinent information given rare min A over 2 sessions  Baseline:  Goal status: IN PROGRESS  4.  Pt will report improved communication effectiveness via PROM by 2 points at last ST session  Baseline: CPIB=3 Goal status: IN PROGRESS   ASSESSMENT:  CLINICAL IMPRESSION: Patient is a 41 y.o. female who was seen today for aphasia. Pt reportedly hospitalized in [place] for adverse drug reaction resulting in aphasia. PCP recommended SLP evaluation due to overt changes in speech. Pt denied any prior or current neurological changes (awaiting MRI). 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 Overt frustration exhibited when pt detailing emotional impact of speech changes, which worsened patient's ability to verbally communicate. Fluency of speech did mildly improve with cued relaxation techniques. Connected speech tasks revealed intermittent vague language, usual empty speech, and inconsistent  grammar. Dysnomia exhibited x1 in session today. Anomia only appreciated during standardized testing today, with pt able to describe items x2 but unable to name item given semantic or first letter cues. Max phonemic cues required. Some mild memory changes endorsed, including delayed processing and difficulty with recall. Pt would benefit from course of ST intervention to educate and train compensatory strategies to aid verbal expression and reduce patient frustration.   OBJECTIVE IMPAIRMENTS include expressive language. These impairments are limiting patient from effectively communicating at home and in community. Factors affecting potential to achieve goals and functional outcome are  unknown etiology . Patient will benefit from skilled SLP services to address above impairments and improve overall function.  REHAB POTENTIAL: Good  PLAN: SLP FREQUENCY: 2x/week  SLP DURATION: 6 weeks  PLANNED INTERVENTIONS: Language facilitation, Cueing hierachy, Internal/external aids, Functional tasks, Multimodal communication approach, SLP instruction and feedback, Compensatory strategies, and Patient/family education    Maia Breslow, CCC-SLP 05/04/2022, 2:41 PM

## 2022-05-04 NOTE — Patient Instructions (Signed)
"  Be patient with me, I need to take my time when talking"   "I will let you know if I need help"   You can have a non-verbal cue that lets your family members know you need help   Noticing un-helping thoughts (these are those thoughts where you're "beating yourself up")   Naming the thought -- it separates the thought from you  Re-frame unhelpful

## 2022-05-07 ENCOUNTER — Ambulatory Visit: Payer: 59 | Admitting: Speech Pathology

## 2022-05-07 ENCOUNTER — Ambulatory Visit (INDEPENDENT_AMBULATORY_CARE_PROVIDER_SITE_OTHER): Payer: 59 | Admitting: Neurology

## 2022-05-07 ENCOUNTER — Encounter: Payer: Self-pay | Admitting: Neurology

## 2022-05-07 VITALS — BP 112/78 | HR 86 | Ht 64.0 in | Wt 237.5 lb

## 2022-05-07 DIAGNOSIS — F444 Conversion disorder with motor symptom or deficit: Secondary | ICD-10-CM | POA: Diagnosis not present

## 2022-05-07 NOTE — Patient Instructions (Addendum)
Continue with speech therapy  Follow up with psychiatry Follow up with you PCP  Return as needed

## 2022-05-07 NOTE — Progress Notes (Signed)
[facility]  PATIENT: [name] DOB: Aug 20, 1981  REQUESTING CLINICIAN: Lorre Munroe, NP HISTORY FROM: Patient  REASON FOR VISIT: Abnormal speech    HISTORICAL  CHIEF COMPLAINT:  Chief Complaint  Patient presents with   New Patient (Initial Visit)    Rm 12. Alone. NP/internal referral for aphasia.    HISTORY OF PRESENT ILLNESS:  This is a 41 year old woman with no reported past medical history who is presenting for abnormal speech. Patient reports she was doing well, initially she was not taking any medications.  In the month of July she was given [facility] due to elevated hemoglobin A1c.  Per chart review her hemoglobin A1c was 5.2 (7/15).  She reported having a bad side effect of Wegovy, she felt like she was given a higher dose than required, she had vomiting, fatigue, insomnia, she was very scared and thought she was dying so she presented to the hospital and was admitted.  Patient said at discharge she was diagnosed with depression and was put on a lot of medications including dicyclomine, gabapentin, hydroxyzine, ropinirole, sertraline and trazodone.  Patient stated at discharge, she started having speech problem described as difficulty getting the words out.  She did follow-up with his PCP and some of the medications including dicyclomine, gabapentin, hydroxyzine, primidone and ropinirole was discontinued.  Patient reports she further self discontinued the medications, she says she only takes the trazodone as needed for sleep and the Sertraline, she is not taking.  She is following up with speech therapy and reports some improvement in her speech.  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   OTHER MEDICAL CONDITIONS: Depression    REVIEW OF SYSTEMS: Full 14 system review of systems performed and negative with exception of: As noted in the HPI   ALLERGIES: No  Known Allergies  HOME MEDICATIONS: Outpatient Medications Prior to Visit  Medication Sig Dispense Refill   aspirin EC 81 MG tablet Take 81 mg by mouth daily. Swallow whole.     Norgestimate-Ethinyl Estradiol Triphasic (TRI-LO-SPRINTEC) 0.18/0.215/0.25 MG-25 MCG tab Take 1 tablet by mouth daily. 28 tablet 5   sertraline (ZOLOFT) 50 MG tablet Take 1 tablet (50 mg total) by mouth daily. 30 tablet 1   traZODone (DESYREL) 100 MG tablet Take 1 tablet (100 mg total) by mouth at bedtime. 30 tablet 1   Cholecalciferol (VITAMIN D3 SUPER STRENGTH PO) Take 1 capsule by mouth daily.     dicyclomine (BENTYL) 20 MG tablet Take 1 tablet (20 mg total) by mouth 2 (two) times daily. 20 tablet 0   gabapentin (NEURONTIN) 300 MG capsule Take 300 mg by mouth 3 (three) times daily. (Patient not taking: Reported on 05/02/2022)     hydrOXYzine (ATARAX) 25 MG tablet Take 1 tablet (25 mg total) by mouth every 6 (six) hours. (Patient not taking: Reported on 04/12/2022) 25 tablet 0   primidone (MYSOLINE) 50 MG tablet Take 100 mg by mouth at bedtime. (Patient not taking: Reported on 05/02/2022)     rOPINIRole (REQUIP) 0.5 MG tablet Take 0.5 mg by mouth 2 (two) times daily. (Patient not taking: Reported on 05/02/2022)     No facility-administered medications prior to visit.    PAST MEDICAL HISTORY: Past Medical History:  Diagnosis Date   Abnormal Pap smear 2011   Asthma    "out grew it" no inhaler   Dysplasia of cervix, low  grade (CIN 1)    Generalized anxiety disorder 12/23/2014   H/O cystitis 2001-01-03   H/O pyelonephritis 01/03/2001   H/O rubella    H/O varicella    History of cesarean delivery 2012/03/24   January 04, 2011 for breech    History of chlamydia 12/27/2000   History of induced abortion 03-24-2012   x2    History of PID 03/29/2006   Monilial vaginitis 08/2006   Obese    Pain pelvic 2001-01-03   Unexpected sudden death of infant 24-Mar-2012   January 04, 2011, SIDS at 28mos     Vaginal bleeding in pregnancy 01/04/2012    PAST SURGICAL HISTORY: Past  Surgical History:  Procedure Laterality Date   bilateral tubal ligation     CESAREAN SECTION     CESAREAN SECTION  07/06/2012   Procedure: CESAREAN SECTION;  Surgeon: Kirkland Hun, MD;  Location: [facility];  Service: Obstetrics;  Laterality: N/A;   CYSTOSCOPY  06/28/2012   Procedure: CYSTOSCOPY;  Surgeon: Kirkland Hun, MD;  Location: [facility];  Service: Obstetrics;  Laterality: N/A;   headaches  2012-01-04    during pregnancy    FAMILY HISTORY: Family History  Problem Relation Age of Onset   Cancer Maternal Grandmother        cervical   Diabetes Paternal Grandmother    Heart disease Maternal Grandfather    Hyperlipidemia Maternal Grandfather    Hypertension Maternal Grandfather    Other Neg Hx     SOCIAL HISTORY: Social History   Socioeconomic History   Marital status: Single    Spouse name: Not on file   Number of children: Not on file   Years of education: Not on file   Highest education level: Not on file  Occupational History   Not on file  Tobacco Use   Smoking status: Former   Smokeless tobacco: Never  Vaping Use   Vaping Use: Never used  Substance and Sexual Activity   Alcohol use: Not Currently    Alcohol/week: 0.0 standard drinks of alcohol   Drug use: Never   Sexual activity: Not Currently    Birth control/protection: Pill  Other Topics Concern   Not on file  Social History Narrative   Not on file   Social Determinants of Health   Financial Resource Strain: Not on file  Food Insecurity: Not on file  Transportation Needs: Not on file  Physical Activity: Not on file  Stress: Not on file  Social Connections: Not on file  Intimate Partner Violence: Not on file     PHYSICAL EXAM  GENERAL EXAM/CONSTITUTIONAL: Vitals:  Vitals:   05/07/22 0855  BP: 112/78  Pulse: 86  Weight: 237 lb 8 oz (107.7 kg)  Height: 5\' 4"  (1.626 m)   Body mass index is 40.77 kg/m. Wt Readings from Last 3 Encounters:  05/07/22 237 lb 8 oz (107.7 kg)  04/12/22 234 lb  (106.1 kg)  03/20/22 250 lb (113.4 kg)   Patient is in no distress; well developed, nourished and groomed; neck is supple  EYES: Pupils round and reactive to light, Visual fields full to confrontation, Extraocular movements intacts,   MUSCULOSKELETAL: Gait, strength, tone, movements noted in Neurologic exam below  NEUROLOGIC: MENTAL STATUS:      No data to display         awake, alert, oriented to person, place and time recent and remote memory intact normal attention and concentration Speech with fluent with some pauses. She is able to name, repeat and comprehend. I did not catch  any paraphrasic errors. At time, there are pauses in her speech, change in tone but when distracted and talking about her job and kids, speech is normal.  fund of knowledge appropriate  CRANIAL NERVE:  2nd, 3rd, 4th, 6th - pupils equal and reactive to light, visual fields full to confrontation, extraocular muscles intact, no nystagmus 5th - facial sensation symmetric 7th - facial strength symmetric 8th - hearing intact 9th - palate elevates symmetrically, uvula midline 11th - shoulder shrug symmetric 12th - tongue protrusion midline  MOTOR:  normal bulk and tone, full strength in the BUE, BLE  SENSORY:  normal and symmetric to light touch  COORDINATION:  finger-nose-finger, fine finger movements normal  REFLEXES:  deep tendon reflexes present and symmetric  GAIT/STATION:  normal   DIAGNOSTIC DATA (LABS, IMAGING, TESTING) - I reviewed patient records, labs, notes, testing and imaging myself where available.  Lab Results  Component Value Date   WBC 14.7 (H) 03/24/2022   HGB 14.0 03/24/2022   HCT 42.3 03/24/2022   MCV 85.3 03/24/2022   PLT 325 03/24/2022      Component Value Date/Time   NA 135 03/24/2022 0629   K 3.6 03/24/2022 0629   CL 98 03/24/2022 0629   CO2 20 (L) 03/24/2022 0629   GLUCOSE 78 03/24/2022 0629   BUN 20 03/24/2022 0629   CREATININE 0.94 03/24/2022 0629    CREATININE 0.95 08/26/2018 1126   CALCIUM 9.5 03/24/2022 0629   PROT 8.9 (H) 03/24/2022 0629   ALBUMIN 3.8 03/24/2022 0629   AST 71 (H) 03/24/2022 0629   ALT 61 (H) 03/24/2022 0629   ALKPHOS 78 03/24/2022 0629   BILITOT 1.1 03/24/2022 0629   GFRNONAA >60 03/24/2022 0629   GFRNONAA 77 08/26/2018 1126   GFRAA 89 08/26/2018 1126   Lab Results  Component Value Date   CHOL 297 (H) 03/24/2022   HDL 66 03/24/2022   LDLCALC 208 (H) 03/24/2022   TRIG 116 03/24/2022   CHOLHDL 4.5 03/24/2022   Lab Results  Component Value Date   HGBA1C 5.2 03/24/2022   Lab Results  Component Value Date   VITAMINB12 560 08/26/2018   Lab Results  Component Value Date   TSH 2.107 03/24/2022    MRI Brain 04/24/2022 No acute intracranial pathology or other finding to explain the patient's symptoms.   ASSESSMENT AND PLAN  41 y.o. year old female with no reported past medical history who is presenting with changes in her speech after having adverse reaction to [facility], and being admitted to the hospital.  She had a brain MRI which was normal, no signs of stroke or tumor that can explain her symptoms.  On exam today she has pauses in her speech and change in tone but when distracted her speech is normal. She is able to name, comprehend and repeat. I informed patient that her speech disorder is most likely a form of somatic disorder/functional speech.  I informed her there are no organic causes of her speech and with speech therapy she should improve.  She voices understanding.  Continue to follow-up with PCP and return as needed.  I also advised her to continue following up with psychiatry regarding the medications that she self discontinued.  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  I provided a letter to patient.   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   No orders of the defined types were placed in this encounter.   Return if symptoms worsen or fail to improve.  I have spent a total of 50 minutes dedicated to this patient today, preparing to see patient, performing a medically appropriate examination and evaluation, ordering tests and/or medications and procedures, and counseling and educating the patient/family/caregiver; independently interpreting result and communicating results to the family/patient/caregiver; and documenting clinical information in the electronic medical record.    Windell Norfolk, MD 05/07/2022, 1:53 PM  [facility] [address], [address] [place], [place] [address] [phone]

## 2022-05-07 NOTE — Therapy (Deleted)
OUTPATIENT SPEECH LANGUAGE PATHOLOGY TREATMENT   Patient Name: [name] MRN: [id number] DOB:1981/01/24, 41 y.o., female Today's Date: 05/07/2022  PCP: Lorre Munroe, NP  REFERRING PROVIDER: Lorre Munroe, NP       Past Medical History:  Diagnosis Date   Abnormal Pap smear 12-28-2009   Asthma    "out grew it" no inhaler   Dysplasia of cervix, low grade (CIN 1)    Generalized anxiety disorder 12/23/2014   H/O cystitis 12/28/00   H/O pyelonephritis 12/28/2000   H/O rubella    H/O varicella    History of cesarean delivery March 28, 2012   29-Dec-2010 for breech    History of chlamydia 12/27/2000   History of induced abortion March 28, 2012   x2    History of PID 03/29/2006   Monilial vaginitis 08/2006   Obese    Pain pelvic 2000/12/28   Unexpected sudden death of infant 03/28/2012   Dec 29, 2010, SIDS at 40mos     Vaginal bleeding in pregnancy Dec 29, 2011   Past Surgical History:  Procedure Laterality Date   bilateral tubal ligation     CESAREAN SECTION     CESAREAN SECTION  07/23/12   Procedure: CESAREAN SECTION;  Surgeon: Kirkland Hun, MD;  Location: [facility];  Service: Obstetrics;  Laterality: N/A;   CYSTOSCOPY  2012/07/23   Procedure: CYSTOSCOPY;  Surgeon: Kirkland Hun, MD;  Location: [facility];  Service: Obstetrics;  Laterality: N/A;   headaches  Dec 29, 2011    during pregnancy   Patient Active Problem List   Diagnosis Date Noted   Prediabetes 09/25/2021   Insomnia 09/25/2021   Hyperlipidemia 08/17/2015   Generalized anxiety disorder 12/23/2014   Morbid obesity (HCC)    Asthma March 28, 2012    ONSET DATE: July 2023   REFERRING DIAG:  R47.01 (ICD-10-CM) - Aphasia  T50.905A (ICD-10-CM) - Adverse effect of drug, initial encounter   THERAPY DIAG:  No diagnosis found.  Rationale for Evaluation and Treatment Rehabilitation  SUBJECTIVE:   SUBJECTIVE STATEMENT: "I'm getting better"   PAIN: Are you having pain? No   OBJECTIVE:   TODAY'S TREATMENT:  05-07-22: Pt reports completion of HEP, ***. Target  imrpoving lexical connections, and ability to use describe it strategy in conversational speech, SLP led pt through [employer]. Following model and systematic instruction, pt successfully described ***Dec 29, 2022, ***12-29-2022, ***12-29-2022 features for x3 target words (***) with *** ***-A.   05-04-22: Pt with usual dysfluency in conversational sample and structured speech tasks. Anomia, paraphasias, and agrammatism observed throughout session. Pt is reporting improvement through implementation of strategies provided at initial evaluation, specifically describing when anomia occurs. Pt primarily using physical strategy to aid in regulation of stress to improve communication efficacy. SLP provided suggestion of mental strategy that pt could use as well. SLP led pt through moderately complex naming tasks. Provided with x3 items from a category, pt able to ID target category in 3/5 trials with usual mod-A. Given category, pt able to name x10, x9, x8 across trials with occasional min-A and usual extended time and SLP encouragement. Benefiting from phonemic and semantic cues this date. Updated HEP.   04-18-22: Educated patient on clinical observations, recommendations for management of emotion increasing communication difficulties, and compensatory strategies to address word retrieval deficits and dysfluency. Handout and brief instruction of anomia instructions provided.    PATIENT EDUCATION: Education details: see above Person educated: Patient Education method: Explanation, Demonstration, and Handouts Education comprehension: verbalized understanding, returned demonstration, and needs further education   GOALS: Goals reviewed with patient?  Yes  SHORT TERM GOALS: Target date: 05/16/2022    Pt will utilize word retrieval strategies in structured speech tasks given occasional min A over 2 sessions Baseline: Goal status: IN PROGRESS  2.  Pt will utilize speech strategies to optimize speech fluency in short  structured conversation given occasional min A over 2 sessions Baseline:  Goal status: IN PROGRESS  3.  Pt will demonstrate behavioral management of emotions to aid verbal expression given occasional min A over 2 sessions  Baseline:  Goal status: IN PROGRESS  4.  Pt will utilize cognitive compensations to aid recall of pertinent information given occasional min A over 2 sessions  Baseline:  Goal status: IN PROGRESS   LONG TERM GOALS: Target date: 06/01/2022    Pt will utilize word retrieval strategies in extended conversation given rare min A over 2 sessions Baseline:  Goal status: IN PROGRESS  2.  Pt will utilize speech strategies to optimize speech fluency in extended conversation given rare min A over 2 sessions Baseline:  Goal status: IN PROGRESS  3.  Pt will utilize cognitive compensations to aid recall of pertinent information given rare min A over 2 sessions  Baseline:  Goal status: IN PROGRESS  4.  Pt will report improved communication effectiveness via PROM by 2 points at last ST session  Baseline: CPIB=3 Goal status: IN PROGRESS   ASSESSMENT:  CLINICAL IMPRESSION: Patient is a 41 y.o. female who was seen today for aphasia. Pt reportedly hospitalized in [place] for adverse drug reaction resulting in aphasia. PCP recommended SLP evaluation due to overt changes in speech. Pt denied any prior or current neurological changes (awaiting MRI). 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 Overt frustration exhibited when pt detailing emotional impact of speech changes, which worsened patient's ability to verbally communicate. Fluency of speech did mildly improve with cued relaxation techniques. Connected speech tasks revealed intermittent vague language, usual empty speech, and inconsistent  grammar. Dysnomia exhibited x1 in session today. Anomia only appreciated during standardized testing today, with pt able to describe items x2 but unable to name item given semantic or first letter cues. Max phonemic cues required. Some mild memory changes endorsed, including delayed processing and difficulty with recall. Pt would benefit from course of ST intervention to educate and train compensatory strategies to aid verbal expression and reduce patient frustration.   OBJECTIVE IMPAIRMENTS include expressive language. These impairments are limiting patient from effectively communicating at home and in community. Factors affecting potential to achieve goals and functional outcome are  unknown etiology . Patient will benefit from skilled SLP services to address above impairments and improve overall function.  REHAB POTENTIAL: Good  PLAN: SLP FREQUENCY: 2x/week  SLP DURATION: 6 weeks  PLANNED INTERVENTIONS: Language facilitation, Cueing hierachy, Internal/external aids, Functional tasks, Multimodal communication approach, SLP instruction and feedback, Compensatory strategies, and Patient/family education    Maia Breslow, CCC-SLP 05/07/2022, 8:37 AM

## 2022-05-10 ENCOUNTER — Ambulatory Visit: Payer: 59 | Admitting: Speech Pathology

## 2022-05-10 ENCOUNTER — Encounter: Payer: Self-pay | Admitting: Internal Medicine

## 2022-05-10 DIAGNOSIS — R4701 Aphasia: Secondary | ICD-10-CM | POA: Diagnosis not present

## 2022-05-10 DIAGNOSIS — R41841 Cognitive communication deficit: Secondary | ICD-10-CM

## 2022-05-10 DIAGNOSIS — R482 Apraxia: Secondary | ICD-10-CM

## 2022-05-10 NOTE — Therapy (Signed)
OUTPATIENT SPEECH LANGUAGE PATHOLOGY TREATMENT   Patient Name: [name] MRN: [id number] DOB:1980/11/08, 41 y.o., female Today's Date: 05/10/2022  PCP: Lorre Munroe, NP  REFERRING PROVIDER: Lorre Munroe, NP    End of Session - 05/10/22 1446     Visit Number 3    Number of Visits 13    Date for SLP Re-Evaluation 06/01/22    Authorization Type [employer]    SLP Start Time 1446    SLP Stop Time  1527    SLP Time Calculation (min) 41 min    Activity Tolerance Patient tolerated treatment well               Past Medical History:  Diagnosis Date   Abnormal Pap smear 12/28/09   Asthma    "out grew it" no inhaler   Dysplasia of cervix, low grade (CIN 1)    Generalized anxiety disorder 12/23/2014   H/O cystitis December 28, 2000   H/O pyelonephritis 12/28/2000   H/O rubella    H/O varicella    History of cesarean delivery 03/28/2012   29-Dec-2010 for breech    History of chlamydia 12/27/2000   History of induced abortion 03-28-2012   x2    History of PID 03/29/2006   Monilial vaginitis 08/2006   Obese    Pain pelvic 28-Dec-2000   Unexpected sudden death of infant 03/28/12   12/29/2010, SIDS at 73mos     Vaginal bleeding in pregnancy December 29, 2011   Past Surgical History:  Procedure Laterality Date   bilateral tubal ligation     CESAREAN SECTION     CESAREAN SECTION  06/29/2012   Procedure: CESAREAN SECTION;  Surgeon: Kirkland Hun, MD;  Location: [facility];  Service: Obstetrics;  Laterality: N/A;   CYSTOSCOPY  06/13/2012   Procedure: CYSTOSCOPY;  Surgeon: Kirkland Hun, MD;  Location: [facility];  Service: Obstetrics;  Laterality: N/A;   headaches  12/29/2011    during pregnancy   Patient Active Problem List   Diagnosis Date Noted   Prediabetes 09/25/2021   Insomnia 09/25/2021   Hyperlipidemia 08/17/2015   Generalized anxiety disorder 12/23/2014   Morbid obesity (HCC)    Asthma Mar 28, 2012    ONSET DATE: July 2023   REFERRING DIAG:  R47.01 (ICD-10-CM) - Aphasia  T50.905A (ICD-10-CM) - Adverse effect of drug,  initial encounter   THERAPY DIAG:  Aphasia  Verbal apraxia  Cognitive communication deficit  Rationale for Evaluation and Treatment Rehabilitation  SUBJECTIVE:   SUBJECTIVE STATEMENT: "My head is hurting"  PAIN: Are you having pain? Yes: NPRS scale: 6/10 Pain location: headache   OBJECTIVE:   TODAY'S TREATMENT:  05-10-22: Pt reports completion of HEP, pt reporting she struggled more than she thought she would. Does report to having increased fluency since 12/29/22. SLP led pt through discourse level activity, in which pt was asked to tell general story about her favorite cartoon. Pt watched 4.5 minute video clip, following summarized demonstrating only typical disfluency. Pt rates her overall fluency and ability to verbally relay her intended message as 5 on 1-5 scale. Pt reports she had appointments yesterday, some word finding difficulty, but overall feel confident in how she verbally expressed herself. Given homographs, pt able to generate x2 sentences demonstrating differing meanings in 7/10 trials with occasional min-A.   05-04-22: Pt with usual dysfluency in conversational sample and structured speech tasks. Anomia, paraphasias, and agrammatism observed throughout session. Pt is reporting improvement through implementation of strategies provided at initial evaluation, specifically describing when anomia occurs. Pt primarily  using physical strategy to aid in regulation of stress to improve communication efficacy. SLP provided suggestion of mental strategy that pt could use as well. SLP led pt through moderately complex naming tasks. Provided with x3 items from a category, pt able to ID target category in 3/5 trials with usual mod-A. Given category, pt able to name x10, x9, x8 across trials with occasional min-A and usual extended time and SLP encouragement. Benefiting from phonemic and semantic cues this date. Updated HEP.   04-18-22: Educated patient on clinical observations,  recommendations for management of emotion increasing communication difficulties, and compensatory strategies to address word retrieval deficits and dysfluency. Handout and brief instruction of anomia instructions provided.    PATIENT EDUCATION: Education details: see above Person educated: Patient Education method: Explanation, Demonstration, and Handouts Education comprehension: verbalized understanding, returned demonstration, and needs further education   GOALS: Goals reviewed with patient? Yes  SHORT TERM GOALS: Target date: 05/16/2022    Pt will utilize word retrieval strategies in structured speech tasks given occasional min A over 2 sessions Baseline: Goal status: IN PROGRESS  2.  Pt will utilize speech strategies to optimize speech fluency in short structured conversation given occasional min A over 2 sessions Baseline:  Goal status: IN PROGRESS  3.  Pt will demonstrate behavioral management of emotions to aid verbal expression given occasional min A over 2 sessions  Baseline:  Goal status: IN PROGRESS  4.  Pt will utilize cognitive compensations to aid recall of pertinent information given occasional min A over 2 sessions  Baseline:  Goal status: IN PROGRESS   LONG TERM GOALS: Target date: 06/01/2022    Pt will utilize word retrieval strategies in extended conversation given rare min A over 2 sessions Baseline:  Goal status: IN PROGRESS  2.  Pt will utilize speech strategies to optimize speech fluency in extended conversation given rare min A over 2 sessions Baseline:  Goal status: IN PROGRESS  3.  Pt will utilize cognitive compensations to aid recall of pertinent information given rare min A over 2 sessions  Baseline:  Goal status: IN PROGRESS  4.  Pt will report improved communication effectiveness via PROM by 2 points at last ST session  Baseline: CPIB=3 Goal status: IN PROGRESS   ASSESSMENT:  CLINICAL IMPRESSION: Patient is a 41 y.o. female who was seen  today for aphasia. Pt reportedly hospitalized in [place] for adverse drug reaction resulting in aphasia. PCP recommended SLP evaluation due to overt changes in speech. Pt denied any prior or current neurological changes (awaiting MRI). 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 Overt frustration exhibited when pt detailing emotional impact of speech changes, which worsened patient's ability to verbally communicate. Fluency of speech did mildly improve with cued relaxation techniques. Connected speech tasks revealed intermittent vague language, usual empty speech, and inconsistent grammar. Dysnomia exhibited x1 in session today. Anomia only appreciated during standardized testing today, with pt able to describe items x2 but unable to name item given semantic or first letter cues. Max phonemic cues required. Some mild memory changes endorsed, including delayed processing and difficulty with recall. Pt would benefit from course of ST intervention to educate and train compensatory strategies to aid verbal expression and reduce patient frustration.   OBJECTIVE IMPAIRMENTS include expressive language. These impairments are limiting patient from effectively communicating at home and in community. Factors affecting potential to achieve  goals and functional outcome are  unknown etiology . Patient will benefit from skilled SLP services to address above impairments and improve overall function.  REHAB POTENTIAL: Good  PLAN: SLP FREQUENCY: 2x/week  SLP DURATION: 6 weeks  PLANNED INTERVENTIONS: Language facilitation, Cueing hierachy, Internal/external aids, Functional tasks, Multimodal communication approach, SLP instruction and feedback, Compensatory strategies, and Patient/family education    Maia Breslow,  CCC-SLP 05/10/2022, 3:27 PM

## 2022-05-16 ENCOUNTER — Ambulatory Visit: Payer: 59 | Admitting: Speech Pathology

## 2022-05-18 ENCOUNTER — Ambulatory Visit: Payer: 59 | Attending: Internal Medicine | Admitting: Speech Pathology

## 2022-05-18 DIAGNOSIS — R482 Apraxia: Secondary | ICD-10-CM | POA: Diagnosis present

## 2022-05-18 DIAGNOSIS — R4701 Aphasia: Secondary | ICD-10-CM | POA: Insufficient documentation

## 2022-05-18 DIAGNOSIS — R41841 Cognitive communication deficit: Secondary | ICD-10-CM | POA: Diagnosis present

## 2022-05-18 NOTE — Therapy (Signed)
OUTPATIENT SPEECH LANGUAGE PATHOLOGY TREATMENT   Patient Name: [name] MRN: [id number] DOB:Sep 26, 1980, 41 y.o., female Today's Date: 05/18/2022  PCP: Jearld Fenton, NP  REFERRING PROVIDER: Jearld Fenton, NP    End of Session - 05/18/22 1318     Visit Number 4    Number of Visits 13    Date for SLP Re-Evaluation 06/01/22    Authorization Type [employer]    SLP Start Time 19-Nov-1313    SLP Stop Time  1791    SLP Time Calculation (min) 33 min    Activity Tolerance Patient tolerated treatment well                Past Medical History:  Diagnosis Date   Abnormal Pap smear 2009/11/19   Asthma    "out grew it" no inhaler   Dysplasia of cervix, low grade (CIN 1)    Generalized anxiety disorder 12/23/2014   H/O cystitis 19-Nov-2000   H/O pyelonephritis November 19, 2000   H/O rubella    H/O varicella    History of cesarean delivery March 26, 2012   Nov 19, 2010 for breech    History of chlamydia 12/27/2000   History of induced abortion 03-26-2012   x2    History of PID 03/29/2006   Monilial vaginitis 08/2006   Obese    Pain pelvic 2000/11/19   Unexpected sudden death of infant March 26, 2012   2010/11/19, SIDS at 67mo     Vaginal bleeding in pregnancy 2March 12, 2013  Past Surgical History:  Procedure Laterality Date   bilateral tubal ligation     CESAREAN SECTION     CESAREAN SECTION  06/29/2012   Procedure: CESAREAN SECTION;  Surgeon: AEna Dawley MD;  [facility];  Service: Obstetrics;  Laterality: N/A;   CYSTOSCOPY  06/16/2012   Procedure: CYSTOSCOPY;  Surgeon: AEna Dawley MD;  [facility];  Service: Obstetrics;  Laterality: N/A;   headaches  2Mar 12, 2013   during pregnancy   Patient Active Problem List   Diagnosis Date Noted   Prediabetes 09/25/2021   Insomnia 09/25/2021   Hyperlipidemia 08/17/2015   Generalized anxiety disorder 12/23/2014   Morbid obesity ([facility]    Asthma 017-Jul-2013   ONSET DATE: July 2023   REFERRING DIAG:  R47.01 (ICD-10-CM) - Aphasia  T50.905A (ICD-10-CM) - Adverse effect of drug,  initial encounter   THERAPY DIAG:  Aphasia  Verbal apraxia  Cognitive communication deficit  Rationale for Evaluation and Treatment Rehabilitation  SUBJECTIVE:   SUBJECTIVE STATEMENT: "My speech is getting better"  PAIN: Are you having pain? No  SPEECH THERAPY DISCHARGE SUMMARY  Visits from Start of Care: 4  Current functional level related to goals / functional outcomes: Presenting with verbal expression within gross normal limits over 30+ minute conversation and within structured discourse level language tasks.    Remaining deficits: Occasional anomia, able to repair using intentional circumlocution or self-cueing.    Education / Equipment: Anomia/dysnomia strategies and compensations, HEP   Patient agrees to discharge. Patient goals were met. Patient is being discharged due to meeting the stated rehab goals..     OBJECTIVE:   TODAY'S TREATMENT:  05-18-22: Pt presents with verbal expression within gross normal limits. Pt reports improvement ongoing 2+ weeks. Tells SLP she needs to use intentional circumlocution but feels confident in ability to do so and feels like it is more "natural" now. 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 Given occasional min-A, pt able to fluently relay steps  in drawing blood, and relay x5 details related to company policies. Pt has generated outline for training to aid in successful training. Pt endorses satisfaction with performance, citing "slowing down" as strategy which aided in success. SLP attempted to re-administer the QAB, pt declines. Reports satisfaction with functional language gains, agreeable to ST d/c this date.   05-10-22: Pt reports completion of HEP, pt reporting she struggled more than she thought she would. Does report to having increased fluency since Saturday. SLP led pt through discourse level activity, in which pt was asked to tell general story about her favorite  cartoon. Pt watched 4.5 minute video clip, following summarized demonstrating only typical disfluency. Pt rates her overall fluency and ability to verbally relay her intended message as 5 on 1-5 scale. Pt reports she had appointments yesterday, some word finding difficulty, but overall feel confident in how she verbally expressed herself. Given homographs, pt able to generate x2 sentences demonstrating differing meanings in 7/10 trials with occasional min-A.   05-04-22: Pt with usual dysfluency in conversational sample and structured speech tasks. Anomia, paraphasias, and agrammatism observed throughout session. Pt is reporting improvement through implementation of strategies provided at initial evaluation, specifically describing when anomia occurs. Pt primarily using physical strategy to aid in regulation of stress to improve communication efficacy. SLP provided suggestion of mental strategy that pt could use as well. SLP led pt through moderately complex naming tasks. Provided with x3 items from a category, pt able to ID target category in 3/5 trials with usual mod-A. Given category, pt able to name x10, x9, x8 across trials with occasional min-A and usual extended time and SLP encouragement. Benefiting from phonemic and semantic cues this date. Updated HEP.   04-18-22: Educated patient on clinical observations, recommendations for management of emotion increasing communication difficulties, and compensatory strategies to address word retrieval deficits and dysfluency. Handout and brief instruction of anomia instructions provided.    PATIENT EDUCATION: Education details: see above Person educated: Patient Education method: Explanation, Demonstration, and Handouts Education comprehension: verbalized understanding, returned demonstration, and needs further education   GOALS: Goals reviewed with patient? Yes  SHORT TERM GOALS: Target date: 05/16/2022    Pt will utilize word retrieval strategies in  structured speech tasks given occasional min A over 2 sessions Baseline: Goal status: MET  2.  Pt will utilize speech strategies to optimize speech fluency in short structured conversation given occasional min A over 2 sessions Baseline:  Goal status: MET  3.  Pt will demonstrate behavioral management of emotions to aid verbal expression given occasional min A over 2 sessions  Baseline:  Goal status: MET  4.  Pt will utilize cognitive compensations to aid recall of pertinent information given occasional min A over 2 sessions  Baseline:  Goal status: MET   LONG TERM GOALS: Target date: 06/01/2022    Pt will utilize word retrieval strategies in extended conversation given rare min A over 2 sessions Baseline:  Goal status: MET  2.  Pt will utilize speech strategies to optimize speech fluency in extended conversation given rare min A over 2 sessions Baseline:  Goal status: MET  3.  Pt will utilize cognitive compensations to aid recall of pertinent information given rare min A over 2 sessions  Baseline:  Goal status: MET  4.  Pt will report improved communication effectiveness via PROM by 2 points at last ST session  Baseline: CPIB=3; CPIB=21 Goal status: MET   ASSESSMENT:  CLINICAL IMPRESSION: Patient is a 41 y.o. 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 SLP has provided education and systamtic instruction for anomia compensations/strategies and led pt through language based activities aimed at increasing fluency and enabling improved communication eficacy. Since initiation of skilled interventions, pt presenting with gross WNL verbal expression. Reporting occasional word finding episodes but is confident in ability to use intentional circumlocution. All goals met, pt agreeable to d/c.     OBJECTIVE IMPAIRMENTS include expressive language. These impairments are limiting patient from effectively communicating at home and in community. Factors affecting potential to achieve goals and functional outcome are  unknown etiology . Patient will benefit from skilled SLP services to address above impairments and improve overall function.  REHAB POTENTIAL: Good   PLAN: SLP FREQUENCY: 2x/week  SLP DURATION: 6 weeks  PLANNED INTERVENTIONS: Language facilitation, Cueing hierachy, Internal/external aids, Functional tasks, Multimodal communication approach, SLP instruction and feedback, Compensatory strategies, and Patient/family education    Su Monks, CCC-SLP 05/18/2022, 1:48 PM

## 2022-05-23 ENCOUNTER — Encounter: Payer: 59 | Admitting: Speech Pathology

## 2022-05-25 ENCOUNTER — Encounter: Payer: 59 | Admitting: Speech Pathology

## 2022-05-30 ENCOUNTER — Encounter: Payer: 59 | Admitting: Speech Pathology

## 2022-06-01 ENCOUNTER — Encounter: Payer: 59 | Admitting: Speech Pathology

## 2022-06-07 ENCOUNTER — Encounter: Payer: 59 | Admitting: Speech Pathology

## 2022-06-08 ENCOUNTER — Ambulatory Visit (INDEPENDENT_AMBULATORY_CARE_PROVIDER_SITE_OTHER): Payer: 59 | Admitting: Internal Medicine

## 2022-06-08 ENCOUNTER — Encounter: Payer: Self-pay | Admitting: Internal Medicine

## 2022-06-08 VITALS — BP 122/84 | HR 94 | Temp 96.8°F | Wt 246.0 lb

## 2022-06-08 DIAGNOSIS — F411 Generalized anxiety disorder: Secondary | ICD-10-CM

## 2022-06-08 DIAGNOSIS — T50905D Adverse effect of unspecified drugs, medicaments and biological substances, subsequent encounter: Secondary | ICD-10-CM

## 2022-06-08 DIAGNOSIS — Z8659 Personal history of other mental and behavioral disorders: Secondary | ICD-10-CM

## 2022-06-08 DIAGNOSIS — F5104 Psychophysiologic insomnia: Secondary | ICD-10-CM

## 2022-06-08 NOTE — Patient Instructions (Signed)

## 2022-06-08 NOTE — Assessment & Plan Note (Signed)
Continue trazodone 

## 2022-06-08 NOTE — Assessment & Plan Note (Signed)
Continue sertraline Support offered 

## 2022-06-08 NOTE — Progress Notes (Signed)
Subjective:    Patient ID: [name], female    DOB: [birth year], 41 y.o.   MRN: [id number]  HPI  Patient presents to clinic today requesting a return to work note.  She has been out since 7/10 due to adverse reaction to a drug and subsequent suicidal ideation.  She has been seeing psychiatry and neurology for the same, but she has been released by them.  Review of Systems     Past Medical History:  Diagnosis Date   Abnormal Pap smear 12-16-2009   Asthma    "out grew it" no inhaler   Dysplasia of cervix, low grade (CIN 1)    Generalized anxiety disorder 12/23/2014   H/O cystitis 12/16/2000   H/O pyelonephritis Dec 16, 2000   H/O rubella    H/O varicella    History of cesarean delivery 03/17/12   2010-12-17 for breech    History of chlamydia 12/27/2000   History of induced abortion 03/17/12   x2    History of PID 03/29/2006   Monilial vaginitis 08/2006   Obese    Pain pelvic December 16, 2000   Unexpected sudden death of infant Mar 17, 2012   12-17-10, SIDS at 80mos     Vaginal bleeding in pregnancy 12/17/11    Current Outpatient Medications  Medication Sig Dispense Refill   aspirin EC 81 MG tablet Take 81 mg by mouth daily. Swallow whole.     Norgestimate-Ethinyl Estradiol Triphasic (TRI-LO-SPRINTEC) 0.18/0.215/0.25 MG-25 MCG tab Take 1 tablet by mouth daily. 28 tablet 5   sertraline (ZOLOFT) 50 MG tablet Take 1 tablet (50 mg total) by mouth daily. 30 tablet 1   traZODone (DESYREL) 100 MG tablet Take 1 tablet (100 mg total) by mouth at bedtime. 30 tablet 1   No current facility-administered medications for this visit.    No Known Allergies  Family History  Problem Relation Age of Onset   Cancer Maternal Grandmother        cervical   Diabetes Paternal Grandmother    Heart disease Maternal Grandfather    Hyperlipidemia Maternal Grandfather    Hypertension Maternal Grandfather    Other Neg Hx     Social History   Socioeconomic History   Marital status: Single    Spouse name: Not on file   Number of  children: Not on file   Years of education: Not on file   Highest education level: Not on file  Occupational History   Not on file  Tobacco Use   Smoking status: Former   Smokeless tobacco: Never  Vaping Use   Vaping Use: Never used  Substance and Sexual Activity   Alcohol use: Not Currently    Alcohol/week: 0.0 standard drinks of alcohol   Drug use: Never   Sexual activity: Not Currently    Birth control/protection: Pill  Other Topics Concern   Not on file  Social History Narrative   Not on file   Social Determinants of Health   Financial Resource Strain: Not on file  Food Insecurity: Not on file  Transportation Needs: Not on file  Physical Activity: Not on file  Stress: Not on file  Social Connections: Not on file  Intimate Partner Violence: Not on file     Constitutional: Denies fever, malaise, fatigue, headache or abrupt weight changes.  HEENT: Denies eye pain, eye redness, ear pain, ringing in the ears, wax buildup, runny nose, nasal congestion, bloody nose, or sore throat. Respiratory: Denies difficulty breathing, shortness of breath, cough or sputum production.   Cardiovascular:  Denies chest pain, chest tightness, palpitations or swelling in the hands or feet.  Neurological: Denies dizziness, difficulty with memory, difficulty with speech or problems with balance and coordination.  Psych: Patient has a history of anxiety.  Denies depression, SI/HI.  No other specific complaints in a complete review of systems (except as listed in HPI above).  Objective:   Physical Exam  BP 122/84 (BP Location: Left Arm, Patient Position: Sitting, Cuff Size: Normal)   Pulse 94   Temp (!) 96.8 F (36 C) (Temporal)   Wt 246 lb (111.6 kg)   SpO2 100%   BMI 42.23 kg/m   Wt Readings from Last 3 Encounters:  05/07/22 237 lb 8 oz (107.7 kg)  04/12/22 234 lb (106.1 kg)  03/20/22 250 lb (113.4 kg)    General: Appears her stated age, obese, in NAD. Cardiovascular: Normal rate  and rhythm. S1,S2 noted.  No murmur, rubs or gallops noted.  Pulmonary/Chest: Normal effort and positive vesicular breath sounds. No respiratory distress. No wheezes, rales or ronchi noted.  Neurological: Alert and oriented.  Psychiatric: Mood and affect normal. Behavior is normal. Judgment and thought content normal.    BMET    Component Value Date/Time   NA 135 03/24/2022 0629   K 3.6 03/24/2022 0629   CL 98 03/24/2022 0629   CO2 20 (L) 03/24/2022 0629   GLUCOSE 78 03/24/2022 0629   BUN 20 03/24/2022 0629   CREATININE 0.94 03/24/2022 0629   CREATININE 0.95 08/26/2018 1126   CALCIUM 9.5 03/24/2022 0629   GFRNONAA >60 03/24/2022 0629   GFRNONAA 77 08/26/2018 1126   GFRAA 89 08/26/2018 1126    Lipid Panel     Component Value Date/Time   CHOL 297 (H) 03/24/2022 0629   TRIG 116 03/24/2022 0629   HDL 66 03/24/2022 0629   CHOLHDL 4.5 03/24/2022 0629   VLDL 23 03/24/2022 0629   LDLCALC 208 (H) 03/24/2022 0629   LDLCALC 146 (H) 08/26/2018 1126    CBC    Component Value Date/Time   WBC 14.7 (H) 03/24/2022 0629   RBC 4.96 03/24/2022 0629   HGB 14.0 03/24/2022 0629   HCT 42.3 03/24/2022 0629   PLT 325 03/24/2022 0629   MCV 85.3 03/24/2022 0629   MCH 28.2 03/24/2022 0629   MCHC 33.1 03/24/2022 0629   RDW 13.3 03/24/2022 0629   LYMPHSABS 4.7 (H) 03/24/2022 0629   MONOABS 0.8 03/24/2022 0629   EOSABS 0.1 03/24/2022 0629   BASOSABS 0.0 03/24/2022 0629    Hgb A1C Lab Results  Component Value Date   HGBA1C 5.2 03/24/2022           Assessment & Plan:   Medication Reaction, History of SI:  She has been released from psychiatry and neurology Return work note provided   RTC in 2 months for follow-up of chronic conditions Nicki Reaper, NP

## 2022-06-13 ENCOUNTER — Encounter: Payer: 59 | Admitting: Speech Pathology

## 2022-06-15 ENCOUNTER — Encounter: Payer: 59 | Admitting: Speech Pathology

## 2022-06-20 ENCOUNTER — Encounter: Payer: 59 | Admitting: Speech Pathology

## 2022-06-22 ENCOUNTER — Encounter: Payer: 59 | Admitting: Speech Pathology

## 2022-07-04 ENCOUNTER — Encounter (HOSPITAL_COMMUNITY): Payer: Self-pay | Admitting: Psychiatry

## 2022-07-04 ENCOUNTER — Telehealth (HOSPITAL_BASED_OUTPATIENT_CLINIC_OR_DEPARTMENT_OTHER): Payer: 59 | Admitting: Psychiatry

## 2022-07-04 DIAGNOSIS — F1994 Other psychoactive substance use, unspecified with psychoactive substance-induced mood disorder: Secondary | ICD-10-CM

## 2022-07-04 DIAGNOSIS — F331 Major depressive disorder, recurrent, moderate: Secondary | ICD-10-CM | POA: Diagnosis not present

## 2022-07-04 DIAGNOSIS — F419 Anxiety disorder, unspecified: Secondary | ICD-10-CM

## 2022-07-04 MED ORDER — SERTRALINE HCL 50 MG PO TABS: 50.0000 mg | ORAL_TABLET | Freq: Every day | ORAL | 0 refills | Status: DC

## 2022-07-04 MED ORDER — TRAZODONE HCL 100 MG PO TABS: 100.0000 mg | ORAL_TABLET | Freq: Every day | ORAL | 0 refills | Status: DC

## 2022-07-04 NOTE — Progress Notes (Signed)
Virtual Visit via Video Note  I connected with [name] on 07/04/22 at  3:00 PM EDT by a video enabled telemedicine application and verified that I am speaking with the correct person using two identifiers.  Location: Patient: Work Provider: Economist   I discussed the limitations of evaluation and management by telemedicine and the availability of in person appointments. The patient expressed understanding and agreed to proceed.  History of Present Illness: Patient is evaluated by video session.  She is doing very well since the last visit.  She has no tremors, shakes and denies any paranoia, hallucination.  Her speech is improved.  She is back to work 2 weeks ago has a full apartments.  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  She was seen by neurologist who cleared her to go back to work.  She is taking Zoloft and trazodone.  She sleeps good.  Slowly and gradually her attention concentration is getting better.  Sometimes she has difficulty word finding but there is no issue in daily functioning.  She denies any crying spells or any feeling of hopelessness or worthlessness.  She denies any panic attack.  She admitted weight gain since the last visit but now she like to focus on her diet.  She is watching her calorie intake and making better choices for her food.  She has no concern from the medication.  She denies drinking or using any illegal substances.  Patient told they have a plan to spend Christmas time at the beach.  Past Psychiatric History: H/O ETOH and drug use.  H/O inpatient in 2007 for above reason but no follow-up. Stopped drinking and using drugs 4 years ago after required inpatient rehab. In July 2023 seen in ER after given higher dose of weight loss medication and experienced hallucination, suicidal thoughts, restlessness, tremors and required inpatient at [facility].  She was discharged on Requip, gabapentin, primidone, Zoloft and trazodone. No h/o  suicidal attempt.    Psychiatric Specialty Exam: Physical Exam  Review of Systems  Weight 246 lb (111.6 kg).There is no height or weight on file to calculate BMI.  General Appearance: Casual  Eye Contact:  Good  Speech:   improved  Volume:  Normal  Mood:   good  Affect:  Congruent  Thought Process:  Descriptions of Associations: Intact  Orientation:  Full (Time, Place, and Person)  Thought Content:  Logical  Suicidal Thoughts:  No  Homicidal Thoughts:  No  Memory:  Immediate;   Good Recent;   Good Remote;   Good  Judgement:  Intact  Insight:  Present  Psychomotor Activity:  Normal  Concentration:  Concentration: Good and Attention Span: Good  Recall:  Good  Fund of Knowledge:  Good  Language:  Good  Akathisia:  No  Handed:  Right  AIMS (if indicated):     Assets:  Communication Skills Desire for Improvement Housing Social Support Transportation  ADL's:  Intact  Cognition:  WNL  Sleep:   ok      Assessment and Plan: Mood disorder, drug-induced.  Anxiety.  Major depressive disorder, recurrent.  Patient doing much better and back to work.  Occasionally she has word-finding but denies any panic attack, crying spells.  I reviewed blood work results.  Patient is going to watch her calorie intake.  She had gained weight since the last visit.  She is driving and having no issue at work.  Continue Zoloft 50 mg daily and trazodone 100  mg at bedtime.  Recommend to call us back if she has any question or any concern.  Follow-up in 3 months.  Follow Up Instructions:    I discussed the assessment and treatment plan with the patient. The patient was provided an opportunity to ask questions and all were answered. The patient agreed and demonstrated an understanding of the instructions.   The patient was advised to call back or seek an in-person evaluation if the symptoms worsen or if the condition fails to improve as anticipated.  Collaboration of Care: Other provider involved  in patient's care AEB notes are available in epic to review.  Patient/Guardian was advised Release of Information must be obtained prior to any record release in order to collaborate their care with an outside provider. Patient/Guardian was advised if they have not already done so to contact the registration department to sign all necessary forms in order for [place] to release information regarding their care.   Consent: Patient/Guardian gives verbal consent for treatment and assignment of benefits for services provided during this visit. Patient/Guardian expressed understanding and agreed to proceed.    I provided 21 minutes of non-face-to-face time during this encounter.   Kathlee Nations, MD

## 2022-07-09 ENCOUNTER — Encounter: Payer: Self-pay | Admitting: Internal Medicine

## 2022-07-12 ENCOUNTER — Other Ambulatory Visit: Payer: Self-pay | Admitting: Nurse Practitioner

## 2022-07-12 DIAGNOSIS — Z1231 Encounter for screening mammogram for malignant neoplasm of breast: Secondary | ICD-10-CM

## 2022-08-28 DIAGNOSIS — Z3490 Encounter for supervision of normal pregnancy, unspecified, unspecified trimester: Secondary | ICD-10-CM | POA: Insufficient documentation

## 2022-08-30 DIAGNOSIS — H9120 Sudden idiopathic hearing loss, unspecified ear: Secondary | ICD-10-CM | POA: Insufficient documentation

## 2022-09-18 ENCOUNTER — Ambulatory Visit
Admission: RE | Admit: 2022-09-18 | Discharge: 2022-09-18 | Disposition: A | Discharge status: Home / Self Care | Payer: 59 | Source: Ambulatory Visit | Attending: Nurse Practitioner | Admitting: Nurse Practitioner

## 2022-09-18 DIAGNOSIS — Z1231 Encounter for screening mammogram for malignant neoplasm of breast: Secondary | ICD-10-CM

## 2022-09-19 ENCOUNTER — Other Ambulatory Visit: Payer: Self-pay | Admitting: Nurse Practitioner

## 2022-09-19 DIAGNOSIS — R928 Other abnormal and inconclusive findings on diagnostic imaging of breast: Secondary | ICD-10-CM

## 2022-09-25 LAB — OB RESULTS CONSOLE HEPATITIS B SURFACE ANTIGEN
Hepatitis B Surface Ag: NEGATIVE
Hepatitis B Surface Ag: NEGATIVE

## 2022-09-25 LAB — OB RESULTS CONSOLE HIV ANTIBODY (ROUTINE TESTING): HIV: NONREACTIVE

## 2022-09-25 LAB — OB RESULTS CONSOLE GC/CHLAMYDIA
Chlamydia: NEGATIVE
Chlamydia: NEGATIVE
Neisseria Gonorrhea: NEGATIVE
Neisseria Gonorrhea: NEGATIVE

## 2022-09-25 LAB — OB RESULTS CONSOLE ABO/RH: RH Type: POSITIVE

## 2022-09-25 LAB — OB RESULTS CONSOLE RUBELLA ANTIBODY, IGM
Rubella: IMMUNE
Rubella: IMMUNE

## 2022-09-25 LAB — OB RESULTS CONSOLE ANTIBODY SCREEN: Antibody Screen: NEGATIVE

## 2022-09-25 LAB — OB RESULTS CONSOLE RPR: RPR: NONREACTIVE

## 2022-09-27 ENCOUNTER — Ambulatory Visit
Admission: RE | Admit: 2022-09-27 | Discharge: 2022-09-27 | Disposition: A | Discharge status: Home / Self Care | Payer: 59 | Source: Ambulatory Visit | Attending: Nurse Practitioner | Admitting: Nurse Practitioner

## 2022-09-27 DIAGNOSIS — R928 Other abnormal and inconclusive findings on diagnostic imaging of breast: Secondary | ICD-10-CM

## 2022-10-03 ENCOUNTER — Telehealth (HOSPITAL_COMMUNITY): Payer: 59 | Admitting: Psychiatry

## 2022-10-08 ENCOUNTER — Telehealth (HOSPITAL_COMMUNITY): Payer: 59 | Admitting: Psychiatry

## 2022-10-11 DIAGNOSIS — Z3A20 20 weeks gestation of pregnancy: Secondary | ICD-10-CM | POA: Diagnosis not present

## 2022-10-11 DIAGNOSIS — Z3A17 17 weeks gestation of pregnancy: Secondary | ICD-10-CM | POA: Diagnosis not present

## 2022-10-11 DIAGNOSIS — O34211 Maternal care for low transverse scar from previous cesarean delivery: Secondary | ICD-10-CM | POA: Diagnosis not present

## 2022-10-11 DIAGNOSIS — O99212 Obesity complicating pregnancy, second trimester: Secondary | ICD-10-CM | POA: Diagnosis not present

## 2022-10-11 DIAGNOSIS — O09522 Supervision of elderly multigravida, second trimester: Secondary | ICD-10-CM | POA: Diagnosis not present

## 2022-10-11 DIAGNOSIS — Z363 Encounter for antenatal screening for malformations: Secondary | ICD-10-CM | POA: Diagnosis not present

## 2022-10-15 ENCOUNTER — Other Ambulatory Visit: Payer: Self-pay | Admitting: Obstetrics and Gynecology

## 2022-10-15 DIAGNOSIS — R9389 Abnormal findings on diagnostic imaging of other specified body structures: Secondary | ICD-10-CM

## 2022-10-15 DIAGNOSIS — Z3A21 21 weeks gestation of pregnancy: Secondary | ICD-10-CM

## 2022-10-15 DIAGNOSIS — Z363 Encounter for antenatal screening for malformations: Secondary | ICD-10-CM

## 2022-10-18 ENCOUNTER — Ambulatory Visit: Payer: Commercial Managed Care - PPO | Attending: Obstetrics

## 2022-10-18 ENCOUNTER — Encounter: Payer: Self-pay | Admitting: *Deleted

## 2022-10-18 ENCOUNTER — Other Ambulatory Visit: Payer: Self-pay | Admitting: *Deleted

## 2022-10-18 ENCOUNTER — Ambulatory Visit: Payer: Commercial Managed Care - PPO | Admitting: *Deleted

## 2022-10-18 ENCOUNTER — Ambulatory Visit: Payer: Commercial Managed Care - PPO

## 2022-10-18 VITALS — BP 113/68 | HR 102

## 2022-10-18 DIAGNOSIS — Z3A21 21 weeks gestation of pregnancy: Secondary | ICD-10-CM | POA: Diagnosis not present

## 2022-10-18 DIAGNOSIS — O34219 Maternal care for unspecified type scar from previous cesarean delivery: Secondary | ICD-10-CM

## 2022-10-18 DIAGNOSIS — R9389 Abnormal findings on diagnostic imaging of other specified body structures: Secondary | ICD-10-CM | POA: Insufficient documentation

## 2022-10-18 DIAGNOSIS — O09522 Supervision of elderly multigravida, second trimester: Secondary | ICD-10-CM

## 2022-10-18 DIAGNOSIS — O99212 Obesity complicating pregnancy, second trimester: Secondary | ICD-10-CM

## 2022-10-18 DIAGNOSIS — Z3A20 20 weeks gestation of pregnancy: Secondary | ICD-10-CM

## 2022-10-18 DIAGNOSIS — E669 Obesity, unspecified: Secondary | ICD-10-CM

## 2022-10-18 DIAGNOSIS — Z363 Encounter for antenatal screening for malformations: Secondary | ICD-10-CM | POA: Diagnosis not present

## 2022-10-18 DIAGNOSIS — Z3689 Encounter for other specified antenatal screening: Secondary | ICD-10-CM | POA: Insufficient documentation

## 2022-10-18 DIAGNOSIS — O35BXX0 Maternal care for other (suspected) fetal abnormality and damage, fetal cardiac anomalies, not applicable or unspecified: Secondary | ICD-10-CM

## 2022-10-22 ENCOUNTER — Other Ambulatory Visit: Payer: 59

## 2022-10-27 LAB — PANORAMA PRENATAL TEST FULL PANEL:PANORAMA TEST PLUS 5 ADDITIONAL MICRODELETIONS: FETAL FRACTION: 5.9

## 2022-10-29 ENCOUNTER — Telehealth: Payer: Self-pay | Admitting: Genetics

## 2022-10-29 NOTE — Telephone Encounter (Signed)
[name] returned genetic counseling's phone call to discuss their cell-free DNA screening (cfDNA) result. The result is low risk. This screening significantly reduces the risk that the current pregnancy has Down syndrome, Trisomy 87, Trisomy 13, Monosomy X, and Triploidy. [name] understands that this is a screening and not a diagnostic test. All questions answered.

## 2022-10-29 NOTE — Telephone Encounter (Signed)
Called [name] to review her cfDNA screening results ([facility]). The mailbox was full so I could not leave a voicemail. [name] has seen her low risk results on MyChart.

## 2022-11-15 ENCOUNTER — Ambulatory Visit: Payer: Commercial Managed Care - PPO | Attending: Obstetrics

## 2022-11-15 ENCOUNTER — Ambulatory Visit: Payer: Commercial Managed Care - PPO | Admitting: *Deleted

## 2022-11-15 VITALS — BP 104/68 | HR 93

## 2022-11-15 DIAGNOSIS — O09522 Supervision of elderly multigravida, second trimester: Secondary | ICD-10-CM | POA: Insufficient documentation

## 2022-11-15 DIAGNOSIS — O99212 Obesity complicating pregnancy, second trimester: Secondary | ICD-10-CM | POA: Insufficient documentation

## 2022-11-15 DIAGNOSIS — E669 Obesity, unspecified: Secondary | ICD-10-CM

## 2022-11-15 DIAGNOSIS — O34219 Maternal care for unspecified type scar from previous cesarean delivery: Secondary | ICD-10-CM | POA: Insufficient documentation

## 2022-11-15 DIAGNOSIS — Z3A24 24 weeks gestation of pregnancy: Secondary | ICD-10-CM

## 2022-11-22 DIAGNOSIS — M47813 Spondylosis without myelopathy or radiculopathy, cervicothoracic region: Secondary | ICD-10-CM | POA: Diagnosis not present

## 2022-11-22 DIAGNOSIS — M9902 Segmental and somatic dysfunction of thoracic region: Secondary | ICD-10-CM | POA: Diagnosis not present

## 2022-11-22 DIAGNOSIS — M47816 Spondylosis without myelopathy or radiculopathy, lumbar region: Secondary | ICD-10-CM | POA: Diagnosis not present

## 2022-11-22 DIAGNOSIS — M25511 Pain in right shoulder: Secondary | ICD-10-CM | POA: Diagnosis not present

## 2022-11-22 DIAGNOSIS — M9903 Segmental and somatic dysfunction of lumbar region: Secondary | ICD-10-CM | POA: Diagnosis not present

## 2022-11-22 DIAGNOSIS — M9901 Segmental and somatic dysfunction of cervical region: Secondary | ICD-10-CM | POA: Diagnosis not present

## 2022-11-22 DIAGNOSIS — M25512 Pain in left shoulder: Secondary | ICD-10-CM | POA: Diagnosis not present

## 2022-11-30 DIAGNOSIS — M9902 Segmental and somatic dysfunction of thoracic region: Secondary | ICD-10-CM | POA: Diagnosis not present

## 2022-11-30 DIAGNOSIS — M47813 Spondylosis without myelopathy or radiculopathy, cervicothoracic region: Secondary | ICD-10-CM | POA: Diagnosis not present

## 2022-11-30 DIAGNOSIS — M25511 Pain in right shoulder: Secondary | ICD-10-CM | POA: Diagnosis not present

## 2022-11-30 DIAGNOSIS — M47816 Spondylosis without myelopathy or radiculopathy, lumbar region: Secondary | ICD-10-CM | POA: Diagnosis not present

## 2022-11-30 DIAGNOSIS — M9901 Segmental and somatic dysfunction of cervical region: Secondary | ICD-10-CM | POA: Diagnosis not present

## 2022-11-30 DIAGNOSIS — M25512 Pain in left shoulder: Secondary | ICD-10-CM | POA: Diagnosis not present

## 2022-11-30 DIAGNOSIS — M9903 Segmental and somatic dysfunction of lumbar region: Secondary | ICD-10-CM | POA: Diagnosis not present

## 2022-12-03 DIAGNOSIS — M9901 Segmental and somatic dysfunction of cervical region: Secondary | ICD-10-CM | POA: Diagnosis not present

## 2022-12-03 DIAGNOSIS — M25512 Pain in left shoulder: Secondary | ICD-10-CM | POA: Diagnosis not present

## 2022-12-03 DIAGNOSIS — M25511 Pain in right shoulder: Secondary | ICD-10-CM | POA: Diagnosis not present

## 2022-12-03 DIAGNOSIS — M47816 Spondylosis without myelopathy or radiculopathy, lumbar region: Secondary | ICD-10-CM | POA: Diagnosis not present

## 2022-12-03 DIAGNOSIS — M9903 Segmental and somatic dysfunction of lumbar region: Secondary | ICD-10-CM | POA: Diagnosis not present

## 2022-12-03 DIAGNOSIS — M9902 Segmental and somatic dysfunction of thoracic region: Secondary | ICD-10-CM | POA: Diagnosis not present

## 2022-12-03 DIAGNOSIS — M47813 Spondylosis without myelopathy or radiculopathy, cervicothoracic region: Secondary | ICD-10-CM | POA: Diagnosis not present

## 2022-12-06 DIAGNOSIS — Z23 Encounter for immunization: Secondary | ICD-10-CM | POA: Diagnosis not present

## 2022-12-06 DIAGNOSIS — Z3689 Encounter for other specified antenatal screening: Secondary | ICD-10-CM | POA: Diagnosis not present

## 2022-12-10 DIAGNOSIS — M25511 Pain in right shoulder: Secondary | ICD-10-CM | POA: Diagnosis not present

## 2022-12-10 DIAGNOSIS — M25512 Pain in left shoulder: Secondary | ICD-10-CM | POA: Diagnosis not present

## 2022-12-10 DIAGNOSIS — M47813 Spondylosis without myelopathy or radiculopathy, cervicothoracic region: Secondary | ICD-10-CM | POA: Diagnosis not present

## 2022-12-10 DIAGNOSIS — M9901 Segmental and somatic dysfunction of cervical region: Secondary | ICD-10-CM | POA: Diagnosis not present

## 2022-12-10 DIAGNOSIS — M9903 Segmental and somatic dysfunction of lumbar region: Secondary | ICD-10-CM | POA: Diagnosis not present

## 2022-12-10 DIAGNOSIS — M47816 Spondylosis without myelopathy or radiculopathy, lumbar region: Secondary | ICD-10-CM | POA: Diagnosis not present

## 2022-12-10 DIAGNOSIS — M9902 Segmental and somatic dysfunction of thoracic region: Secondary | ICD-10-CM | POA: Diagnosis not present

## 2022-12-17 DIAGNOSIS — M47816 Spondylosis without myelopathy or radiculopathy, lumbar region: Secondary | ICD-10-CM | POA: Diagnosis not present

## 2022-12-17 DIAGNOSIS — M25512 Pain in left shoulder: Secondary | ICD-10-CM | POA: Diagnosis not present

## 2022-12-17 DIAGNOSIS — M9901 Segmental and somatic dysfunction of cervical region: Secondary | ICD-10-CM | POA: Diagnosis not present

## 2022-12-17 DIAGNOSIS — M9902 Segmental and somatic dysfunction of thoracic region: Secondary | ICD-10-CM | POA: Diagnosis not present

## 2022-12-17 DIAGNOSIS — M9903 Segmental and somatic dysfunction of lumbar region: Secondary | ICD-10-CM | POA: Diagnosis not present

## 2022-12-17 DIAGNOSIS — M47813 Spondylosis without myelopathy or radiculopathy, cervicothoracic region: Secondary | ICD-10-CM | POA: Diagnosis not present

## 2022-12-17 DIAGNOSIS — M25511 Pain in right shoulder: Secondary | ICD-10-CM | POA: Diagnosis not present

## 2022-12-24 DIAGNOSIS — M9903 Segmental and somatic dysfunction of lumbar region: Secondary | ICD-10-CM | POA: Diagnosis not present

## 2022-12-24 DIAGNOSIS — M47816 Spondylosis without myelopathy or radiculopathy, lumbar region: Secondary | ICD-10-CM | POA: Diagnosis not present

## 2022-12-24 DIAGNOSIS — M9902 Segmental and somatic dysfunction of thoracic region: Secondary | ICD-10-CM | POA: Diagnosis not present

## 2022-12-24 DIAGNOSIS — M25512 Pain in left shoulder: Secondary | ICD-10-CM | POA: Diagnosis not present

## 2022-12-24 DIAGNOSIS — M47813 Spondylosis without myelopathy or radiculopathy, cervicothoracic region: Secondary | ICD-10-CM | POA: Diagnosis not present

## 2022-12-24 DIAGNOSIS — M9901 Segmental and somatic dysfunction of cervical region: Secondary | ICD-10-CM | POA: Diagnosis not present

## 2022-12-24 DIAGNOSIS — M25511 Pain in right shoulder: Secondary | ICD-10-CM | POA: Diagnosis not present

## 2022-12-31 DIAGNOSIS — M9902 Segmental and somatic dysfunction of thoracic region: Secondary | ICD-10-CM | POA: Diagnosis not present

## 2022-12-31 DIAGNOSIS — M47816 Spondylosis without myelopathy or radiculopathy, lumbar region: Secondary | ICD-10-CM | POA: Diagnosis not present

## 2022-12-31 DIAGNOSIS — M25512 Pain in left shoulder: Secondary | ICD-10-CM | POA: Diagnosis not present

## 2022-12-31 DIAGNOSIS — M25511 Pain in right shoulder: Secondary | ICD-10-CM | POA: Diagnosis not present

## 2022-12-31 DIAGNOSIS — M9903 Segmental and somatic dysfunction of lumbar region: Secondary | ICD-10-CM | POA: Diagnosis not present

## 2022-12-31 DIAGNOSIS — M47813 Spondylosis without myelopathy or radiculopathy, cervicothoracic region: Secondary | ICD-10-CM | POA: Diagnosis not present

## 2022-12-31 DIAGNOSIS — M9901 Segmental and somatic dysfunction of cervical region: Secondary | ICD-10-CM | POA: Diagnosis not present

## 2023-01-07 ENCOUNTER — Telehealth (HOSPITAL_COMMUNITY): Payer: 59 | Admitting: Psychiatry

## 2023-01-10 DIAGNOSIS — M25512 Pain in left shoulder: Secondary | ICD-10-CM | POA: Diagnosis not present

## 2023-01-10 DIAGNOSIS — M9903 Segmental and somatic dysfunction of lumbar region: Secondary | ICD-10-CM | POA: Diagnosis not present

## 2023-01-10 DIAGNOSIS — M25511 Pain in right shoulder: Secondary | ICD-10-CM | POA: Diagnosis not present

## 2023-01-10 DIAGNOSIS — M9901 Segmental and somatic dysfunction of cervical region: Secondary | ICD-10-CM | POA: Diagnosis not present

## 2023-01-10 DIAGNOSIS — M47813 Spondylosis without myelopathy or radiculopathy, cervicothoracic region: Secondary | ICD-10-CM | POA: Diagnosis not present

## 2023-01-10 DIAGNOSIS — M47816 Spondylosis without myelopathy or radiculopathy, lumbar region: Secondary | ICD-10-CM | POA: Diagnosis not present

## 2023-01-10 DIAGNOSIS — M9902 Segmental and somatic dysfunction of thoracic region: Secondary | ICD-10-CM | POA: Diagnosis not present

## 2023-01-14 DIAGNOSIS — M47816 Spondylosis without myelopathy or radiculopathy, lumbar region: Secondary | ICD-10-CM | POA: Diagnosis not present

## 2023-01-14 DIAGNOSIS — M9903 Segmental and somatic dysfunction of lumbar region: Secondary | ICD-10-CM | POA: Diagnosis not present

## 2023-01-14 DIAGNOSIS — M9901 Segmental and somatic dysfunction of cervical region: Secondary | ICD-10-CM | POA: Diagnosis not present

## 2023-01-14 DIAGNOSIS — M25511 Pain in right shoulder: Secondary | ICD-10-CM | POA: Diagnosis not present

## 2023-01-14 DIAGNOSIS — M9902 Segmental and somatic dysfunction of thoracic region: Secondary | ICD-10-CM | POA: Diagnosis not present

## 2023-01-14 DIAGNOSIS — M25512 Pain in left shoulder: Secondary | ICD-10-CM | POA: Diagnosis not present

## 2023-01-14 DIAGNOSIS — M47813 Spondylosis without myelopathy or radiculopathy, cervicothoracic region: Secondary | ICD-10-CM | POA: Diagnosis not present

## 2023-01-15 ENCOUNTER — Inpatient Hospital Stay (HOSPITAL_COMMUNITY)
Admission: AD | Admit: 2023-01-15 | Discharge: 2023-01-16 | Disposition: A | Discharge status: Home / Self Care | Payer: Commercial Managed Care - PPO | Attending: Obstetrics and Gynecology | Admitting: Obstetrics and Gynecology

## 2023-01-15 DIAGNOSIS — N949 Unspecified condition associated with female genital organs and menstrual cycle: Secondary | ICD-10-CM | POA: Diagnosis not present

## 2023-01-15 DIAGNOSIS — O26893 Other specified pregnancy related conditions, third trimester: Secondary | ICD-10-CM | POA: Insufficient documentation

## 2023-01-15 DIAGNOSIS — Z3A33 33 weeks gestation of pregnancy: Secondary | ICD-10-CM | POA: Diagnosis not present

## 2023-01-15 DIAGNOSIS — R102 Pelvic and perineal pain: Secondary | ICD-10-CM | POA: Insufficient documentation

## 2023-01-15 DIAGNOSIS — R5381 Other malaise: Secondary | ICD-10-CM | POA: Diagnosis not present

## 2023-01-15 DIAGNOSIS — R5383 Other fatigue: Secondary | ICD-10-CM

## 2023-01-15 DIAGNOSIS — Z87891 Personal history of nicotine dependence: Secondary | ICD-10-CM | POA: Insufficient documentation

## 2023-01-15 DIAGNOSIS — O26813 Pregnancy related exhaustion and fatigue, third trimester: Secondary | ICD-10-CM | POA: Insufficient documentation

## 2023-01-15 NOTE — MAU Note (Signed)
.  [name] is a 42 y.o. at [redacted]w[redacted]d here in MAU reporting: reporting exhaustion all day, intermittent vaginal pain and pressure like lightening crouch when baby move. Pt working in [employer], and was sent by her supervisor to be evaluated. Pt endorses FM. Pt denies VB, LOF, abnormal discharge, PIH s/s, and complications in the pregnancy.   Onset of complaint: today  Pain score: 7/10 Vitals:   01/15/23 2350  BP: 116/75  Pulse: (!) 106  Resp: 20  Temp: 98 F (36.7 C)  SpO2: 99%     FHT:155 Lab orders placed from triage:  UA

## 2023-01-16 ENCOUNTER — Encounter (HOSPITAL_COMMUNITY): Payer: Self-pay | Admitting: Obstetrics and Gynecology

## 2023-01-16 DIAGNOSIS — Z3A33 33 weeks gestation of pregnancy: Secondary | ICD-10-CM

## 2023-01-16 DIAGNOSIS — N949 Unspecified condition associated with female genital organs and menstrual cycle: Secondary | ICD-10-CM

## 2023-01-16 DIAGNOSIS — R5383 Other fatigue: Secondary | ICD-10-CM | POA: Diagnosis not present

## 2023-01-16 DIAGNOSIS — R5381 Other malaise: Secondary | ICD-10-CM

## 2023-01-16 LAB — URINALYSIS, ROUTINE W REFLEX MICROSCOPIC
Glucose, UA: NEGATIVE mg/dL
Hgb urine dipstick: NEGATIVE
Ketones, ur: NEGATIVE mg/dL
Leukocytes,Ua: NEGATIVE
Nitrite: NEGATIVE
Protein, ur: 30 mg/dL — AB
Specific Gravity, Urine: 1.031 — ABNORMAL HIGH (ref 1.005–1.030)
pH: 5 (ref 5.0–8.0)

## 2023-01-16 LAB — CBC
HCT: 31.8 % — ABNORMAL LOW (ref 36.0–46.0)
Hemoglobin: 10.3 g/dL — ABNORMAL LOW (ref 12.0–15.0)
MCH: 27.7 pg (ref 26.0–34.0)
MCHC: 32.4 g/dL (ref 30.0–36.0)
MCV: 85.5 fL (ref 80.0–100.0)
Platelets: 238 10*3/uL (ref 150–400)
RBC: 3.72 MIL/uL — ABNORMAL LOW (ref 3.87–5.11)
RDW: 14.6 % (ref 11.5–15.5)
WBC: 6.8 10*3/uL (ref 4.0–10.5)
nRBC: 0 % (ref 0.0–0.2)

## 2023-01-16 LAB — BASIC METABOLIC PANEL
Anion gap: 7 (ref 5–15)
BUN: 10 mg/dL (ref 6–20)
CO2: 21 mmol/L — ABNORMAL LOW (ref 22–32)
Calcium: 8.6 mg/dL — ABNORMAL LOW (ref 8.9–10.3)
Chloride: 104 mmol/L (ref 98–111)
Creatinine, Ser: 0.64 mg/dL (ref 0.44–1.00)
GFR, Estimated: >60 mL/min (ref >60)
Glucose, Bld: 83 mg/dL (ref 70–99)
Potassium: 3.4 mmol/L — ABNORMAL LOW (ref 3.5–5.1)
Sodium: 132 mmol/L — ABNORMAL LOW (ref 135–145)

## 2023-01-16 NOTE — MAU Provider Note (Signed)
Chief Complaint:  Vaginal Pain   Event Date/Time   First Provider Initiated Contact with Patient 01/16/23 0036     HPI: [name] is a 42 y.o. [id number] at 29w3dwho presents to maternity admissions reporting feeling exhausted all day and having intermittent sharp "lightning crotch" pains.  States has never felt like this before.   She reports good fetal movement, denies LOF, vaginal bleeding, urinary symptoms, h/a, dizziness, n/v, diarrhea, constipation or fever/chills.   History of two C/S deliveries and two vaginal deliveries.  Vaginal Pain The patient's primary symptoms include pelvic pain. The patient's pertinent negatives include no genital itching, vaginal bleeding or vaginal discharge. This is a new problem. The current episode started today. She is pregnant. Pertinent negatives include no back pain, chills, constipation, diarrhea, dysuria, fever, flank pain, frequency, headaches, nausea or vomiting. Nothing aggravates the symptoms. She has tried nothing for the symptoms.  Other Chronicity: Exhaustion. The current episode started today. The problem has been unchanged. Pertinent negatives include no chills, fever, headaches, nausea or vomiting. Nothing aggravates the symptoms. She has tried nothing for the symptoms.   RN Note: [name] is a 42 y.o. at [redacted]w[redacted]d here in MAU reporting: reporting exhaustion all day, intermittent vaginal pain and pressure like lightening crouch when baby move. Pt working in [employer], and was sent by her supervisor to be evaluated. Pt endorses FM. Pt denies VB, LOF, abnormal discharge, PIH s/s, and complications in the pregnancy.   Past Medical History: Past Medical History:  Diagnosis Date   Abnormal Pap smear Feb 18, 2010   Asthma    "out grew it" no inhaler   Dysplasia of cervix, low grade (CIN 1)    Generalized anxiety disorder 12/23/2014   H/O cystitis 02-18-2001   H/O pyelonephritis 02-18-01   H/O rubella    H/O varicella    History of cesarean delivery April 03, 2012    2011/02/19 for breech    History of chlamydia 12/27/2000   History of induced abortion 2012-04-03   x2    History of PID 03/29/2006   Monilial vaginitis 08/2006   Obese    Pain pelvic 2001/02/18   Unexpected sudden death of infant 04-03-2012   February 19, 2011, SIDS at 3mos     Vaginal bleeding in pregnancy 02-19-2012    Past obstetric history: OB History  Gravida Para Term Preterm AB Living  8 4 4   3 3   SAB IAB Ectopic Multiple Live Births  3       3    # Outcome Date GA Lbr Len/2nd Weight Sex Delivery Anes PTL Lv  8 Current           7 Term 07/01/2012 [redacted]w[redacted]d  2885 g M CS-LTranv Spinal    6 Term 02/19/2011 [redacted]w[redacted]d  3062 g F CS-LTranv   DEC  5 Term 02/2005 [redacted]w[redacted]d 08:00 3629 g M Vag-Spont EPI N LIV  4 Term 03/2002 [redacted]w[redacted]d 08:00 2892 g F Vag-Spont EPI N LIV  3 SAB 1999    U    DEC  2 SAB           1 SAB             Past Surgical History: Past Surgical History:  Procedure Laterality Date   bilateral tubal ligation     CESAREAN SECTION     CESAREAN SECTION  06/24/2012   Procedure: CESAREAN SECTION;  Surgeon: Kirkland Hun, MD;  Location: [facility];  Service: Obstetrics;  Laterality: N/A;   CYSTOSCOPY  06/21/2012  Procedure: CYSTOSCOPY;  Surgeon: Kirkland Hun, MD;  Location: [facility];  Service: Obstetrics;  Laterality: N/A;   headaches  2013    during pregnancy    Family History: Family History  Problem Relation Age of Onset   Cancer Maternal Grandmother        cervical   Heart disease Maternal Grandfather    Hyperlipidemia Maternal Grandfather    Hypertension Maternal Grandfather    Diabetes Paternal Grandmother    Other Neg Hx    Breast cancer Neg Hx     Social History: Social History   Tobacco Use   Smoking status: Former   Smokeless tobacco: Never  Building services engineer Use: Never used  Substance Use Topics   Alcohol use: Not Currently    Alcohol/week: 0.0 standard drinks of alcohol   Drug use: Never    Allergies: No Known Allergies  Meds:  Medications Prior to Admission  Medication Sig  Dispense Refill Last Dose   aspirin EC 81 MG tablet Take 81 mg by mouth daily. Swallow whole.   01/15/2023   Norgestimate-Ethinyl Estradiol Triphasic (TRI-LO-SPRINTEC) 0.18/0.215/0.25 MG-25 MCG tab Take 1 tablet by mouth daily. (Patient not taking: Reported on 01/16/2023) 28 tablet 5 Not Taking   sertraline (ZOLOFT) 50 MG tablet Take 1 tablet (50 mg total) by mouth daily. (Patient not taking: Reported on 10/18/2022) 90 tablet 0    traZODone (DESYREL) 100 MG tablet Take 1 tablet (100 mg total) by mouth at bedtime. (Patient not taking: Reported on 10/18/2022) 90 tablet 0     I have reviewed patient's Past Medical Hx, Surgical Hx, Family Hx, Social Hx, medications and allergies.   ROS:  Review of Systems  Constitutional:  Negative for chills and fever.  Gastrointestinal:  Negative for constipation, diarrhea, nausea and vomiting.  Genitourinary:  Positive for pelvic pain and vaginal pain. Negative for dysuria, flank pain, frequency and vaginal discharge.  Musculoskeletal:  Negative for back pain.  Neurological:  Negative for headaches.   Other systems negative  Physical Exam  Patient Vitals for the past 24 hrs:  BP Temp Temp src Pulse Resp SpO2 Height Weight  01/15/23 2350 116/75 98 F (36.7 C) Oral (!) 106 20 99 % 5\' 4"  (1.626 m) 129.3 kg   Constitutional: Well-developed, well-nourished female in no acute distress.  Cardiovascular: normal rate and rhythm Respiratory: normal effort, clear to auscultation bilaterally GI: Abd soft, non-tender, gravid appropriate for gestational age.   No rebound or guarding. MS: Extremities nontender, no edema, normal ROM Neurologic: Alert and oriented x 4.  GU: Neg CVAT.  Dilation: Closed Effacement (%): Thick Cervical Position: Posterior Exam by:: M.Taquita Demby,CNM   FHT:  Baseline 140 , moderate variability, accelerations present, no decelerations Contractions: none   Labs: Results for orders placed or performed during the hospital encounter of 01/15/23  (from the past 24 hour(s))  Urinalysis, Routine w reflex microscopic -Urine, Clean Catch     Status: Abnormal   Collection Time: 01/15/23 11:48 PM  Result Value Ref Range   Color, Urine AMBER (A) YELLOW   APPearance HAZY (A) CLEAR   Specific Gravity, Urine 1.031 (H) 1.005 - 1.030   pH 5.0 5.0 - 8.0   Glucose, UA NEGATIVE NEGATIVE mg/dL   Hgb urine dipstick NEGATIVE NEGATIVE   Bilirubin Urine SMALL (A) NEGATIVE   Ketones, ur NEGATIVE NEGATIVE mg/dL   Protein, ur 30 (A) NEGATIVE mg/dL   Nitrite NEGATIVE NEGATIVE   Leukocytes,Ua NEGATIVE NEGATIVE   RBC / HPF 0-5 0 -  5 RBC/hpf   WBC, UA 0-5 0 - 5 WBC/hpf   Bacteria, UA RARE (A) NONE SEEN   Squamous Epithelial / HPF 11-20 0 - 5 /HPF   Mucus PRESENT    Ca Oxalate Crys, UA PRESENT   CBC     Status: Abnormal   Collection Time: 01/16/23  1:52 AM  Result Value Ref Range   WBC 6.8 4.0 - 10.5 K/uL   RBC 3.72 (L) 3.87 - 5.11 MIL/uL   Hemoglobin 10.3 (L) 12.0 - 15.0 g/dL   HCT 78.2 (L) 95.6 - 21.3 %   MCV 85.5 80.0 - 100.0 fL   MCH 27.7 26.0 - 34.0 pg   MCHC 32.4 30.0 - 36.0 g/dL   RDW 08.6 57.8 - 46.9 %   Platelets 238 150 - 400 K/uL   nRBC 0.0 0.0 - 0.2 %  Basic metabolic panel     Status: Abnormal   Collection Time: 01/16/23  1:52 AM  Result Value Ref Range   Sodium 132 (L) 135 - 145 mmol/L   Potassium 3.4 (L) 3.5 - 5.1 mmol/L   Chloride 104 98 - 111 mmol/L   CO2 21 (L) 22 - 32 mmol/L   Glucose, Bld 83 70 - 99 mg/dL   BUN 10 6 - 20 mg/dL   Creatinine, Ser 6.29 0.44 - 1.00 mg/dL   Calcium 8.6 (L) 8.9 - 10.3 mg/dL   GFR, Estimated >52 >84 mL/min   Anion gap 7 5 - 15     Imaging:  No results found.  MAU Course/MDM: I have reviewed the triage vital signs and the nursing notes.   Pertinent labs & imaging results that were available during my care of the patient were reviewed by me and considered in my medical decision making (see chart for details).      I have reviewed her medical records including past results, notes and  treatments.   I have ordered labs and reviewed results. Labs are essentially normal, very mild hyponatremia.   Mild anemia. Discussed she may be fighting off a virus or simply be sleep deficient. NST reviewed   Treatments in MAU included EFM.    Assessment: Single IUP at [redacted]w[redacted]d Exhaustion Malaise Probable round ligament pain vs pulling of scar tissue  Plan: Discharge home Preterm Labor precautions and fetal kick counts Rest and hydration Follow up in Office for prenatal visits and recheck Encouraged to return if she develops worsening of symptoms, increase in pain, fever, or other concerning symptoms.   Pt stable at time of discharge.  Wynelle Bourgeois CNM, MSN Certified Nurse-Midwife 01/16/2023 12:36 AM

## 2023-01-21 DIAGNOSIS — M9903 Segmental and somatic dysfunction of lumbar region: Secondary | ICD-10-CM | POA: Diagnosis not present

## 2023-01-21 DIAGNOSIS — M25512 Pain in left shoulder: Secondary | ICD-10-CM | POA: Diagnosis not present

## 2023-01-21 DIAGNOSIS — M9902 Segmental and somatic dysfunction of thoracic region: Secondary | ICD-10-CM | POA: Diagnosis not present

## 2023-01-21 DIAGNOSIS — M9901 Segmental and somatic dysfunction of cervical region: Secondary | ICD-10-CM | POA: Diagnosis not present

## 2023-01-21 DIAGNOSIS — M25511 Pain in right shoulder: Secondary | ICD-10-CM | POA: Diagnosis not present

## 2023-01-21 DIAGNOSIS — M47816 Spondylosis without myelopathy or radiculopathy, lumbar region: Secondary | ICD-10-CM | POA: Diagnosis not present

## 2023-01-21 DIAGNOSIS — M47813 Spondylosis without myelopathy or radiculopathy, cervicothoracic region: Secondary | ICD-10-CM | POA: Diagnosis not present

## 2023-01-28 LAB — OB RESULTS CONSOLE GBS: GBS: NEGATIVE

## 2023-02-06 DIAGNOSIS — O34211 Maternal care for low transverse scar from previous cesarean delivery: Secondary | ICD-10-CM | POA: Diagnosis not present

## 2023-02-06 DIAGNOSIS — O09523 Supervision of elderly multigravida, third trimester: Secondary | ICD-10-CM | POA: Diagnosis not present

## 2023-02-06 DIAGNOSIS — Z3A36 36 weeks gestation of pregnancy: Secondary | ICD-10-CM | POA: Diagnosis not present

## 2023-02-06 DIAGNOSIS — O99213 Obesity complicating pregnancy, third trimester: Secondary | ICD-10-CM | POA: Diagnosis not present

## 2023-02-06 DIAGNOSIS — O09519 Supervision of elderly primigravida, unspecified trimester: Secondary | ICD-10-CM | POA: Diagnosis not present

## 2023-02-07 NOTE — Patient Instructions (Signed)
Mackenzi Osio Gervase  02/07/2023   Your procedure is scheduled on:  02/21/2023  Arrive at 0530 at Entrance C on [address] at [facility]  and [employer]. You are invited to use the FREE valet parking or use the Visitor's parking deck.  Pick up the phone at the desk and dial [phone].  Call this number if you have problems the morning of surgery: [phone]  Remember:   Do not eat food:(After Midnight) Desps de medianoche.  Do not drink clear liquids: (After Midnight) Desps de medianoche.  Take these medicines the morning of surgery with A SIP OF WATER:  none   Do not wear jewelry, make-up or nail polish.  Do not wear lotions, powders, or perfumes. Do not wear deodorant.  Do not shave 48 hours prior to surgery.  Do not bring valuables to the hospital.  [facility] is not   responsible for any belongings or valuables brought to the hospital.  Contacts, dentures or bridgework may not be worn into surgery.  Leave suitcase in the car. After surgery it may be brought to your room.  For patients admitted to the hospital, checkout time is 11:00 AM the day of              discharge.      Please read over the following fact sheets that you were given:     Preparing for Surgery

## 2023-02-08 ENCOUNTER — Encounter (HOSPITAL_COMMUNITY): Payer: Self-pay

## 2023-02-19 ENCOUNTER — Encounter (HOSPITAL_COMMUNITY)
Admission: RE | Admit: 2023-02-19 | Discharge: 2023-02-19 | Disposition: A | Discharge status: Home / Self Care | Payer: Commercial Managed Care - PPO | Source: Ambulatory Visit | Attending: Obstetrics and Gynecology | Admitting: Obstetrics and Gynecology

## 2023-02-19 ENCOUNTER — Inpatient Hospital Stay (HOSPITAL_COMMUNITY)
Admission: RE | Admit: 2023-02-19 | Payer: Commercial Managed Care - PPO | Source: Home / Self Care | Admitting: Obstetrics and Gynecology

## 2023-02-20 ENCOUNTER — Encounter (HOSPITAL_COMMUNITY)
Admission: RE | Admit: 2023-02-20 | Discharge: 2023-02-20 | Disposition: A | Discharge status: Home / Self Care | Payer: Commercial Managed Care - PPO | Source: Ambulatory Visit | Attending: Obstetrics and Gynecology | Admitting: Obstetrics and Gynecology

## 2023-02-20 DIAGNOSIS — O34211 Maternal care for low transverse scar from previous cesarean delivery: Secondary | ICD-10-CM | POA: Insufficient documentation

## 2023-02-20 DIAGNOSIS — Z3A39 39 weeks gestation of pregnancy: Secondary | ICD-10-CM | POA: Insufficient documentation

## 2023-02-20 LAB — CBC
HCT: 37.2 % (ref 36.0–46.0)
Hemoglobin: 11.8 g/dL — ABNORMAL LOW (ref 12.0–15.0)
MCH: 26.7 pg (ref 26.0–34.0)
MCHC: 31.7 g/dL (ref 30.0–36.0)
MCV: 84.2 fL (ref 80.0–100.0)
Platelets: 219 10*3/uL (ref 150–400)
RBC: 4.42 MIL/uL (ref 3.87–5.11)
RDW: 15.2 % (ref 11.5–15.5)
WBC: 7.5 10*3/uL (ref 4.0–10.5)
nRBC: 0 % (ref 0.0–0.2)

## 2023-02-20 LAB — RPR: RPR Ser Ql: NONREACTIVE

## 2023-02-20 LAB — TYPE AND SCREEN
ABO/RH(D): AB POS
Antibody Screen: NEGATIVE

## 2023-02-20 NOTE — Patient Instructions (Signed)
Jazell Rosenau Hoke  02/20/2023   Your procedure is scheduled on:  02/21/2023  Arrive at 0530 at Entrance C on [address] at [facility]  and [employer]. You are invited to use the FREE valet parking or use the Visitor's parking deck.  Pick up the phone at the desk and dial [phone].  Call this number if you have problems the morning of surgery: [phone]  Remember:   Do not eat food:(After Midnight) Desps de medianoche.  Do not drink clear liquids: (After Midnight) Desps de medianoche.  Take these medicines the morning of surgery with A SIP OF WATER:  none   Do not wear jewelry, make-up or nail polish.  Do not wear lotions, powders, or perfumes. Do not wear deodorant.  Do not shave 48 hours prior to surgery.  Do not bring valuables to the hospital.  [facility] is not   responsible for any belongings or valuables brought to the hospital.  Contacts, dentures or bridgework may not be worn into surgery.  Leave suitcase in the car. After surgery it may be brought to your room.  For patients admitted to the hospital, checkout time is 11:00 AM the day of              discharge.      Please read over the following fact sheets that you were given:     Preparing for Surgery

## 2023-02-20 NOTE — H&P (Signed)
[name] is a 42 y.o. female, G7 P4033, EGA [redacted] weeks with EDC 6-20 presenting for repeat c-section.  Had 2 SVD, then LTCS x 2, considered VBAC if went into labor.  PNC complicated by AMA-NIPT low risk, isolated EIF on u/s, obesity, has been on baby ASA.  OB History     Gravida  8   Para  4   Term  4   Preterm      AB  3   Living  3      SAB  3   IAB      Ectopic      Multiple      Live Births  3          Past Medical History:  Diagnosis Date   Abnormal Pap smear 18-Mar-2010   Asthma    "out grew it" no inhaler   Dysplasia of cervix, low grade (CIN 1)    Generalized anxiety disorder 12/23/2014   H/O cystitis 03/18/2001   H/O pyelonephritis 18-Mar-2001   H/O rubella    H/O varicella    History of cesarean delivery 28-Mar-2012   2011-03-19 for breech    History of chlamydia 12/27/2000   History of induced abortion 2012/03/28   x2    History of PID 03/29/2006   Monilial vaginitis 08/2006   Obese    Pain pelvic 03-18-01   Unexpected sudden death of infant March 28, 2012   03/19/11, SIDS at 3mos     Vaginal bleeding in pregnancy 03/18/2012   Past Surgical History:  Procedure Laterality Date   bilateral tubal ligation     CESAREAN SECTION     CESAREAN SECTION  06/27/2012   Procedure: CESAREAN SECTION;  Surgeon: Kirkland Hun, MD;  Location: [facility];  Service: Obstetrics;  Laterality: N/A;   CYSTOSCOPY  06/21/2012   Procedure: CYSTOSCOPY;  Surgeon: Kirkland Hun, MD;  Location: [facility];  Service: Obstetrics;  Laterality: N/A;   headaches  18-Mar-2012    during pregnancy   Family History: family history includes Cancer in her maternal grandmother; Diabetes in her paternal grandmother; Heart disease in her maternal grandfather; Hyperlipidemia in her maternal grandfather; Hypertension in her maternal grandfather. Social History:  reports that she has quit smoking. She has never used smokeless tobacco. She reports that she does not currently use alcohol. She reports that she does not use drugs.      Maternal Diabetes: No Genetic Screening: Normal Maternal Ultrasounds/Referrals: Isolated EIF (echogenic intracardiac focus) Fetal Ultrasounds or other Referrals:  None Maternal Substance Abuse:  No Significant Maternal Medications:  None Significant Maternal Lab Results:  Group B Strep negative Number of Prenatal Visits:greater than 3 verified prenatal visits Other Comments:  None  Review of Systems  Respiratory: Negative.    Cardiovascular: Negative.    Maternal Medical History:  Fetal activity: Perceived fetal activity is normal.   Prenatal Complications - Diabetes: none.     Last menstrual period 07/15/2022. Maternal Exam:  Abdomen: Patient reports no abdominal tenderness. Surgical scars: low transverse.   Estimated fetal weight is 8 lbs.   Fetal presentation: vertex Introitus: Normal vulva. Normal vagina.  Amniotic fluid character: not assessed.   Physical Exam Constitutional:      Appearance: She is obese.  Cardiovascular:     Rate and Rhythm: Normal rate and regular rhythm.     Heart sounds: Normal heart sounds. No murmur heard. Pulmonary:     Effort: Pulmonary effort is normal. No respiratory distress.     Breath  sounds: Normal breath sounds. No wheezing.  Abdominal:     Palpations: Abdomen is soft.  Genitourinary:    General: Normal vulva.  Musculoskeletal:     Cervical back: Normal range of motion and neck supple.     Prenatal labs: ABO, Rh: --/--/AB POS (06/12 0945) Antibody: NEG (06/12 0945) Rubella: Immune (01/16 0000) RPR: NON REACTIVE (06/12 1000)  HBsAg: Negative (01/16 0000)  HIV: Non-reactive (01/16 0000)  GBS: Negative/-- (05/20 0000)   Assessment/Plan: IUP at 39 weeks, previous LTCS x 2, AMA, obesity, being admitted for repeat c-section.  Surgical procedure and risks discussed, questions answered.    [name] 02/20/2023, 7:13 PM

## 2023-02-21 ENCOUNTER — Encounter (HOSPITAL_COMMUNITY): Payer: Self-pay | Admitting: Obstetrics and Gynecology

## 2023-02-21 ENCOUNTER — Inpatient Hospital Stay (HOSPITAL_COMMUNITY): Payer: Commercial Managed Care - PPO | Admitting: Anesthesiology

## 2023-02-21 ENCOUNTER — Inpatient Hospital Stay (HOSPITAL_COMMUNITY)
Admission: RE | Admit: 2023-02-21 | Discharge: 2023-02-24 | DRG: 788 | Disposition: A | Discharge status: Home / Self Care | Payer: Commercial Managed Care - PPO | Attending: Obstetrics and Gynecology | Admitting: Obstetrics and Gynecology

## 2023-02-21 ENCOUNTER — Encounter (HOSPITAL_COMMUNITY)
Admission: RE | Disposition: A | Discharge status: Home / Self Care | Payer: Self-pay | Source: Home / Self Care | Attending: Obstetrics and Gynecology

## 2023-02-21 ENCOUNTER — Other Ambulatory Visit: Payer: Self-pay

## 2023-02-21 DIAGNOSIS — O9952 Diseases of the respiratory system complicating childbirth: Secondary | ICD-10-CM

## 2023-02-21 DIAGNOSIS — J45909 Unspecified asthma, uncomplicated: Secondary | ICD-10-CM | POA: Diagnosis not present

## 2023-02-21 DIAGNOSIS — O99344 Other mental disorders complicating childbirth: Secondary | ICD-10-CM

## 2023-02-21 DIAGNOSIS — O99214 Obesity complicating childbirth: Secondary | ICD-10-CM | POA: Diagnosis present

## 2023-02-21 DIAGNOSIS — F419 Anxiety disorder, unspecified: Secondary | ICD-10-CM | POA: Diagnosis not present

## 2023-02-21 DIAGNOSIS — Z98891 History of uterine scar from previous surgery: Secondary | ICD-10-CM | POA: Diagnosis not present

## 2023-02-21 DIAGNOSIS — O34211 Maternal care for low transverse scar from previous cesarean delivery: Secondary | ICD-10-CM

## 2023-02-21 DIAGNOSIS — Z87891 Personal history of nicotine dependence: Secondary | ICD-10-CM | POA: Diagnosis not present

## 2023-02-21 DIAGNOSIS — Z3A39 39 weeks gestation of pregnancy: Secondary | ICD-10-CM

## 2023-02-21 DIAGNOSIS — Z3A38 38 weeks gestation of pregnancy: Secondary | ICD-10-CM | POA: Diagnosis not present

## 2023-02-21 SURGERY — Surgical Case
Anesthesia: Spinal

## 2023-02-21 MED ORDER — OXYTOCIN-SODIUM CHLORIDE 30-0.9 UT/500ML-% IV SOLN
INTRAVENOUS | Status: DC | PRN
  Administered 2023-02-21 (×2): 30 [IU] via INTRAVENOUS

## 2023-02-21 MED ORDER — ACETAMINOPHEN 500 MG PO TABS: 1000.0000 mg | ORAL_TABLET | Freq: Four times a day (QID) | ORAL | Status: DC

## 2023-02-21 MED ORDER — KETOROLAC TROMETHAMINE 30 MG/ML IJ SOLN
30.0000 mg | Freq: Once | INTRAMUSCULAR | Status: AC | PRN
  Administered 2023-02-21: 30 mg via INTRAVENOUS

## 2023-02-21 MED ORDER — SODIUM CHLORIDE 0.9% FLUSH: 3.0000 mL | INTRAVENOUS | Status: DC | PRN

## 2023-02-21 MED ORDER — FENTANYL CITRATE (PF) 100 MCG/2ML IJ SOLN: 25.0000 ug | INTRAMUSCULAR | Status: DC | PRN

## 2023-02-21 MED ORDER — DIBUCAINE (PERIANAL) 1 % EX OINT: 1.0000 | TOPICAL_OINTMENT | CUTANEOUS | Status: DC | PRN

## 2023-02-21 MED ORDER — LACTATED RINGERS IV SOLN: INTRAVENOUS | Status: DC

## 2023-02-21 MED ORDER — DIPHENHYDRAMINE HCL 25 MG PO CAPS: 25.0000 mg | ORAL_CAPSULE | Freq: Four times a day (QID) | ORAL | Status: DC | PRN

## 2023-02-21 MED ORDER — MAGNESIUM HYDROXIDE 400 MG/5ML PO SUSP: 30.0000 mL | ORAL | Status: DC | PRN

## 2023-02-21 MED ORDER — KETOROLAC TROMETHAMINE 30 MG/ML IJ SOLN: 30.0000 mg | Freq: Four times a day (QID) | INTRAMUSCULAR | Status: DC | PRN

## 2023-02-21 MED ORDER — TETANUS-DIPHTH-ACELL PERTUSSIS 5-2.5-18.5 LF-MCG/0.5 IM SUSY: 0.5000 mL | PREFILLED_SYRINGE | Freq: Once | INTRAMUSCULAR | Status: DC

## 2023-02-21 MED ORDER — SCOPOLAMINE 1 MG/3DAYS TD PT72
1.0000 | MEDICATED_PATCH | Freq: Once | TRANSDERMAL | Status: AC
  Administered 2023-02-21: 1.5 mg via TRANSDERMAL

## 2023-02-21 MED ORDER — KETOROLAC TROMETHAMINE 30 MG/ML IJ SOLN
INTRAMUSCULAR | Status: AC
  Filled 2023-02-21: qty 1

## 2023-02-21 MED ORDER — DEXAMETHASONE SODIUM PHOSPHATE 10 MG/ML IJ SOLN
INTRAMUSCULAR | Status: DC | PRN
  Administered 2023-02-21: 10 mg via INTRAVENOUS

## 2023-02-21 MED ORDER — WITCH HAZEL-GLYCERIN EX PADS: 1.0000 | MEDICATED_PAD | CUTANEOUS | Status: DC | PRN

## 2023-02-21 MED ORDER — MORPHINE SULFATE (PF) 0.5 MG/ML IJ SOLN
INTRAMUSCULAR | Status: DC | PRN
  Administered 2023-02-21: 150 ug via INTRATHECAL

## 2023-02-21 MED ORDER — MORPHINE SULFATE (PF) 0.5 MG/ML IJ SOLN
INTRAMUSCULAR | Status: AC
  Filled 2023-02-21: qty 10

## 2023-02-21 MED ORDER — DIPHENHYDRAMINE HCL 25 MG PO CAPS: 25.0000 mg | ORAL_CAPSULE | ORAL | Status: DC | PRN

## 2023-02-21 MED ORDER — MENTHOL 3 MG MT LOZG: 1.0000 | LOZENGE | OROMUCOSAL | Status: DC | PRN

## 2023-02-21 MED ORDER — ONDANSETRON HCL 4 MG/2ML IJ SOLN
INTRAMUSCULAR | Status: AC
  Filled 2023-02-21: qty 2

## 2023-02-21 MED ORDER — PHENYLEPHRINE HCL-NACL 20-0.9 MG/250ML-% IV SOLN
INTRAVENOUS | Status: DC | PRN
  Administered 2023-02-21: 60 ug/min via INTRAVENOUS

## 2023-02-21 MED ORDER — ZOLPIDEM TARTRATE 5 MG PO TABS: 5.0000 mg | ORAL_TABLET | Freq: Every evening | ORAL | Status: DC | PRN

## 2023-02-21 MED ORDER — OXYTOCIN-SODIUM CHLORIDE 30-0.9 UT/500ML-% IV SOLN
2.5000 [IU]/h | INTRAVENOUS | Status: AC
  Administered 2023-02-21: 2.5 [IU]/h via INTRAVENOUS

## 2023-02-21 MED ORDER — DEXAMETHASONE SODIUM PHOSPHATE 10 MG/ML IJ SOLN
INTRAMUSCULAR | Status: AC
  Filled 2023-02-21: qty 1

## 2023-02-21 MED ORDER — BUPIVACAINE IN DEXTROSE 0.75-8.25 % IT SOLN
INTRATHECAL | Status: DC | PRN
  Administered 2023-02-21: 1.6 mL via INTRATHECAL

## 2023-02-21 MED ORDER — MEASLES, MUMPS & RUBELLA VAC IJ SOLR: 0.5000 mL | Freq: Once | INTRAMUSCULAR | Status: DC

## 2023-02-21 MED ORDER — KETOROLAC TROMETHAMINE 30 MG/ML IJ SOLN
30.0000 mg | Freq: Four times a day (QID) | INTRAMUSCULAR | Status: AC
  Administered 2023-02-21 – 2023-02-22 (×4): 30 mg via INTRAVENOUS
  Filled 2023-02-21 (×4): qty 1

## 2023-02-21 MED ORDER — SCOPOLAMINE 1 MG/3DAYS TD PT72
MEDICATED_PATCH | TRANSDERMAL | Status: AC
  Filled 2023-02-21: qty 1

## 2023-02-21 MED ORDER — SODIUM CHLORIDE 0.9 % IR SOLN
Status: DC | PRN
  Administered 2023-02-21: 100 mL via INTRAVESICAL

## 2023-02-21 MED ORDER — ACETAMINOPHEN 10 MG/ML IV SOLN
INTRAVENOUS | Status: DC | PRN
  Administered 2023-02-21: 1000 mg via INTRAVENOUS

## 2023-02-21 MED ORDER — DIPHENHYDRAMINE HCL 50 MG/ML IJ SOLN: 12.5000 mg | INTRAMUSCULAR | Status: DC | PRN

## 2023-02-21 MED ORDER — POVIDONE-IODINE 10 % EX SWAB
2.0000 | Freq: Once | CUTANEOUS | Status: AC
  Administered 2023-02-21: 2 via TOPICAL

## 2023-02-21 MED ORDER — OXYCODONE HCL 5 MG PO TABS
5.0000 mg | ORAL_TABLET | ORAL | Status: DC | PRN
  Administered 2023-02-22 (×2): 5 mg via ORAL
  Administered 2023-02-22 – 2023-02-24 (×6): 10 mg via ORAL
  Filled 2023-02-21 (×3): qty 2
  Filled 2023-02-21: qty 1
  Filled 2023-02-21 (×2): qty 2
  Filled 2023-02-21: qty 1
  Filled 2023-02-21: qty 2

## 2023-02-21 MED ORDER — ONDANSETRON HCL 4 MG/2ML IJ SOLN: 4.0000 mg | Freq: Three times a day (TID) | INTRAMUSCULAR | Status: DC | PRN

## 2023-02-21 MED ORDER — METHYLERGONOVINE MALEATE 0.2 MG PO TABS: 0.2000 mg | ORAL_TABLET | ORAL | Status: DC | PRN

## 2023-02-21 MED ORDER — PRENATAL MULTIVITAMIN CH
1.0000 | ORAL_TABLET | Freq: Every day | ORAL | Status: DC
  Administered 2023-02-21 – 2023-02-24 (×4): 1 via ORAL
  Filled 2023-02-21 (×4): qty 1

## 2023-02-21 MED ORDER — FENTANYL CITRATE (PF) 100 MCG/2ML IJ SOLN
INTRAMUSCULAR | Status: DC | PRN
  Administered 2023-02-21: 15 ug via INTRATHECAL

## 2023-02-21 MED ORDER — CEFAZOLIN IN SODIUM CHLORIDE 3-0.9 GM/100ML-% IV SOLN
INTRAVENOUS | Status: AC
  Filled 2023-02-21: qty 100

## 2023-02-21 MED ORDER — FENTANYL CITRATE (PF) 100 MCG/2ML IJ SOLN
INTRAMUSCULAR | Status: AC
  Filled 2023-02-21: qty 2

## 2023-02-21 MED ORDER — ENOXAPARIN SODIUM 80 MG/0.8ML IJ SOSY
65.0000 mg | PREFILLED_SYRINGE | INTRAMUSCULAR | Status: DC
  Administered 2023-02-21 – 2023-02-23 (×3): 65 mg via SUBCUTANEOUS
  Filled 2023-02-21 (×3): qty 0.8

## 2023-02-21 MED ORDER — NALOXONE HCL 4 MG/10ML IJ SOLN: 1.0000 ug/kg/h | INTRAVENOUS | Status: DC | PRN

## 2023-02-21 MED ORDER — SENNOSIDES-DOCUSATE SODIUM 8.6-50 MG PO TABS
2.0000 | ORAL_TABLET | ORAL | Status: DC
  Administered 2023-02-21 – 2023-02-24 (×4): 2 via ORAL
  Filled 2023-02-21 (×4): qty 2

## 2023-02-21 MED ORDER — NALOXONE HCL 0.4 MG/ML IJ SOLN: 0.4000 mg | INTRAMUSCULAR | Status: DC | PRN

## 2023-02-21 MED ORDER — ONDANSETRON HCL 4 MG/2ML IJ SOLN
INTRAMUSCULAR | Status: DC | PRN
  Administered 2023-02-21: 4 mg via INTRAVENOUS

## 2023-02-21 MED ORDER — IBUPROFEN 600 MG PO TABS
600.0000 mg | ORAL_TABLET | Freq: Four times a day (QID) | ORAL | Status: DC
  Administered 2023-02-22 – 2023-02-24 (×8): 600 mg via ORAL
  Filled 2023-02-21 (×8): qty 1

## 2023-02-21 MED ORDER — STERILE WATER FOR IRRIGATION IR SOLN
Status: DC | PRN
  Administered 2023-02-21: 1000 mL

## 2023-02-21 MED ORDER — ACETAMINOPHEN 500 MG PO TABS
1000.0000 mg | ORAL_TABLET | Freq: Four times a day (QID) | ORAL | Status: DC
  Administered 2023-02-21 – 2023-02-24 (×11): 1000 mg via ORAL
  Filled 2023-02-21 (×12): qty 2

## 2023-02-21 MED ORDER — METHYLERGONOVINE MALEATE 0.2 MG/ML IJ SOLN: 0.2000 mg | INTRAMUSCULAR | Status: DC | PRN

## 2023-02-21 MED ORDER — CEFAZOLIN IN SODIUM CHLORIDE 3-0.9 GM/100ML-% IV SOLN
3.0000 g | INTRAVENOUS | Status: AC
  Administered 2023-02-21: 3 g via INTRAVENOUS

## 2023-02-21 MED ORDER — SIMETHICONE 80 MG PO CHEW: 80.0000 mg | CHEWABLE_TABLET | ORAL | Status: DC | PRN

## 2023-02-21 MED ORDER — COCONUT OIL OIL: 1.0000 | TOPICAL_OIL | Status: DC | PRN

## 2023-02-21 SURGICAL SUPPLY — 33 items
APL PRP STRL LF DISP 70% ISPRP (MISCELLANEOUS) ×2
APL SKNCLS STERI-STRIP NONHPOA (GAUZE/BANDAGES/DRESSINGS) ×1
BENZOIN TINCTURE PRP APPL 2/3 (GAUZE/BANDAGES/DRESSINGS) IMPLANT
CHLORAPREP W/TINT 26 (MISCELLANEOUS) ×2 IMPLANT
CLAMP UMBILICAL CORD (MISCELLANEOUS) ×1 IMPLANT
CLOTH BEACON ORANGE TIMEOUT ST (SAFETY) ×1 IMPLANT
CLSR STERI-STRIP ANTIMIC 1/2X4 (GAUZE/BANDAGES/DRESSINGS) IMPLANT
DRSG OPSITE POSTOP 4X10 (GAUZE/BANDAGES/DRESSINGS) ×1 IMPLANT
ELECT REM PT RETURN 9FT ADLT (ELECTROSURGICAL) ×1
ELECTRODE REM PT RTRN 9FT ADLT (ELECTROSURGICAL) ×1 IMPLANT
EXTRACTOR VACUUM KIWI (MISCELLANEOUS) IMPLANT
EXTRACTOR VACUUM M CUP 4 TUBE (SUCTIONS) IMPLANT
GLOVE BIOGEL PI IND STRL 7.0 (GLOVE) ×1 IMPLANT
GLOVE ORTHO TXT STRL SZ7.5 (GLOVE) ×1 IMPLANT
GOWN STRL REUS W/TWL LRG LVL3 (GOWN DISPOSABLE) ×2 IMPLANT
KIT ABG SYR 3ML LUER SLIP (SYRINGE) IMPLANT
NDL HYPO 25X5/8 SAFETYGLIDE (NEEDLE) ×1 IMPLANT
NEEDLE HYPO 25X5/8 SAFETYGLIDE (NEEDLE) ×1 IMPLANT
NS IRRIG 1000ML POUR BTL (IV SOLUTION) ×1 IMPLANT
PACK C SECTION WH (CUSTOM PROCEDURE TRAY) ×1 IMPLANT
PAD OB MATERNITY 4.3X12.25 (PERSONAL CARE ITEMS) ×1 IMPLANT
RETAINER VISCERAL (MISCELLANEOUS) IMPLANT
RTRCTR C-SECT PINK 25CM LRG (MISCELLANEOUS) ×1 IMPLANT
SUT CHROMIC 1 CTX 36 (SUTURE) ×2 IMPLANT
SUT PLAIN 0 NONE (SUTURE) IMPLANT
SUT PLAIN 2 0 XLH (SUTURE) IMPLANT
SUT VIC AB 0 CT1 27 (SUTURE) ×2
SUT VIC AB 0 CT1 27XBRD ANBCTR (SUTURE) ×2 IMPLANT
SUT VIC AB 2-0 CT1 (SUTURE) ×1 IMPLANT
SUT VIC AB 4-0 KS 27 (SUTURE) IMPLANT
TOWEL OR 17X24 6PK STRL BLUE (TOWEL DISPOSABLE) ×1 IMPLANT
TRAY FOLEY W/BAG SLVR 14FR LF (SET/KITS/TRAYS/PACK) ×1 IMPLANT
WATER STERILE IRR 1000ML POUR (IV SOLUTION) ×1 IMPLANT

## 2023-02-21 NOTE — Anesthesia Procedure Notes (Signed)
Spinal  Patient location during procedure: OR Start time: 02/21/2023 7:35 AM End time: 02/21/2023 7:37 AM Reason for block: surgical anesthesia Staffing Performed: anesthesiologist  Anesthesiologist: Elmer Picker, MD Performed by: Elmer Picker, MD Authorized by: Elmer Picker, MD   Preanesthetic Checklist Completed: patient identified, IV checked, risks and benefits discussed, surgical consent, monitors and equipment checked, pre-op evaluation and timeout performed Spinal Block Patient position: sitting Prep: DuraPrep and site prepped and draped Patient monitoring: cardiac monitor, continuous pulse ox and blood pressure Approach: midline Location: L3-4 Injection technique: single-shot Needle Needle type: Pencan  Needle gauge: 24 G Needle length: 9 cm Assessment Sensory level: T6 Events: CSF return Additional Notes Functioning IV was confirmed and monitors were applied. Sterile prep and drape, including hand hygiene and sterile gloves were used. The patient was positioned and the spine was prepped. The skin was anesthetized with lidocaine.  Free flow of clear CSF was obtained prior to injecting local anesthetic into the CSF.  The spinal needle aspirated freely following injection.  The needle was carefully withdrawn.  The patient tolerated the procedure well.

## 2023-02-21 NOTE — Op Note (Addendum)
Preoperative diagnosis: Intrauterine pregnancy at 39 weeks, previous c-section x 2 Postoperative diagnosis: Same Procedure: Repeat low transverse cesarean section without extensions Surgeon: Lavina Hamman M.D. Assistant:  Arby Barrette, NP Anesthesia: Spinal  Findings: Patient had normal gravid anatomy and delivered a viable female infant with Apgars of 9 and 9 weight pending.  Assistance from NP Loreta Ave was necessary for retraction , suction, and assistance with delivery Estimated blood loss: 540 cc Specimens: Placenta sent to labor and delivery Complications: None  Procedure in detail: The patient was taken to the operating room and placed in the sitting position. The anesthesiologist instilled spinal anesthesia.  She was then placed in the dorsosupine position with left tilt. Abdomen was then prepped and draped in the usual sterile fashion, and a foley catheter was inserted. The level of her anesthesia was found to be adequate. Abdomen was entered via a standard Pfannenstiel incision through her previous scar. Once the peritoneal cavity was entered, some omental adhesions were taken down, then the Alexis disposable self-retaining retractor was placed and good visualization was achieved. A 4 cm transverse incision was then made in the lower uterine segment pushing the bladder inferior. Once the uterine cavity was entered the incision was extended digitally, clear amniotic fluid. The fetal vertex was grasped and delivered through the incision atraumatically. Mouth and nares were suctioned. The remainder of the infant then delivered atraumatically. Cord was doubly clamped and cut after one minute and the infant handed to the awaiting pediatric team. Cord blood was obtained. The placenta delivered spontaneously. Uterus was wiped dry with clean lap pad and all clots and debris were removed. Uterine incision was inspected and found to be free of extensions. Uterine incision was closed in 1 layer with running  locking #1 Chromic. Tubes and ovaries were inspected and found to be normal. Uterine incision was inspected and found to be hemostatic. Bleeding from serosal edges was controlled with electrocautery. The Alexis retractor was removed. Peritoneum was closed with running 2-0 Vicryl due to prolapsing bowel.   Fascia was closed in running fashion starting at both ends and meeting in the middle with 0 Vicryl. Subcutaneous tissue was then irrigated and made hemostatic with electrocautery, then closed with running 2-0 plain gut. Skin was closed with running 4-0 Vicryl subcuticular suture followed by steri-strips and a sterile dressing. Patient tolerated the procedure well and was taken to the recovery in stable condition. Counts were correct x2, she received Ancef 3 g IV at the beginning of the procedure and she had PAS hose on throughout the procedure.

## 2023-02-21 NOTE — Transfer of Care (Signed)
Immediate Anesthesia Transfer of Care Note  Patient: [name]  Procedure(s) Performed: CESAREAN SECTION  Patient Location: PACU  Anesthesia Type:Spinal  Level of Consciousness: awake, alert , and patient cooperative  Airway & Oxygen Therapy: Patient Spontanous Breathing  Post-op Assessment: Report given to RN and Post -op Vital signs reviewed and stable  Post vital signs: Reviewed and stable  Last Vitals:  Vitals Value Taken Time  BP 94/75 02/21/23 0915  Temp    Pulse 75 02/21/23 0916  Resp 16 02/21/23 0916  SpO2 100 % 02/21/23 0916  Vitals shown include unvalidated device data.  Last Pain:  Vitals:   02/21/23 0559  TempSrc:   PainSc: 0-No pain         Complications: No notable events documented.

## 2023-02-21 NOTE — Anesthesia Preprocedure Evaluation (Addendum)
Anesthesia Evaluation  Patient identified by MRN, date of birth, ID band Patient awake    Reviewed: Allergy & Precautions, NPO status , Patient's Chart, lab work & pertinent test results  Airway Mallampati: III  TM Distance: >3 FB Neck ROM: Full    Dental no notable dental hx.    Pulmonary asthma , former smoker   Pulmonary exam normal breath sounds clear to auscultation       Cardiovascular negative cardio ROS Normal cardiovascular exam Rhythm:Regular Rate:Normal     Neuro/Psych  PSYCHIATRIC DISORDERS Anxiety     negative neurological ROS     GI/Hepatic negative GI ROS, Neg liver ROS,,,  Endo/Other    Morbid obesity (BMI 49)  Renal/GU negative Renal ROS  negative genitourinary   Musculoskeletal negative musculoskeletal ROS (+)    Abdominal   Peds  Hematology negative hematology ROS (+) Pt WILL accept blood products   Anesthesia Other Findings   Reproductive/Obstetrics (+) Pregnancy                             Anesthesia Physical Anesthesia Plan  ASA: 3  Anesthesia Plan: Spinal   Post-op Pain Management:    Induction:   PONV Risk Score and Plan: Treatment may vary due to age or medical condition  Airway Management Planned: Natural Airway  Additional Equipment:   Intra-op Plan:   Post-operative Plan:   Informed Consent: I have reviewed the patients History and Physical, chart, labs and discussed the procedure including the risks, benefits and alternatives for the proposed anesthesia with the patient or authorized representative who has indicated his/her understanding and acceptance.     Dental advisory given  Plan Discussed with: CRNA  Anesthesia Plan Comments:        Anesthesia Quick Evaluation

## 2023-02-21 NOTE — Interval H&P Note (Signed)
History and Physical Interval Note:  02/21/2023 7:13 AM  [name] Abt  has presented today for surgery, with the diagnosis of repeat c-section.  The various methods of treatment have been discussed with the patient and family. After consideration of risks, benefits and other options for treatment, the patient has consented to  Procedure(s): CESAREAN SECTION (Jensen/A) as a surgical intervention.  The patient's history has been reviewed, patient examined, no change in status, stable for surgery.  I have reviewed the patient's chart and labs.  Questions were answered to the patient's satisfaction.     Leighton Roach Carlinda Ohlson

## 2023-02-21 NOTE — Anesthesia Postprocedure Evaluation (Signed)
Anesthesia Post Note  Patient: [name]  Procedure(s) Performed: CESAREAN SECTION     Patient location during evaluation: PACU Anesthesia Type: Spinal Level of consciousness: oriented and awake and alert Pain management: pain level controlled Vital Signs Assessment: post-procedure vital signs reviewed and stable Respiratory status: spontaneous breathing, respiratory function stable and patient connected to nasal cannula oxygen Cardiovascular status: blood pressure returned to baseline and stable Postop Assessment: no headache, no backache and no apparent nausea or vomiting Anesthetic complications: no  No notable events documented.  Last Vitals:  Vitals:   02/21/23 1015 02/21/23 1030  BP: 107/71 108/77  Pulse: 82   Resp: 17 20  Temp:  36.8 C  SpO2: 97% 98%    Last Pain:  Vitals:   02/21/23 1030  TempSrc: Oral  PainSc: 0-No pain   Pain Goal:                Epidural/Spinal Function Cutaneous sensation: Tingles (02/21/23 1030), Patient able to flex knees: Yes (02/21/23 1030), Patient able to lift hips off bed: Yes (02/21/23 1030), Back pain beyond tenderness at insertion site: No (02/21/23 1030), Progressively worsening motor and/or sensory loss: No (02/21/23 1030), Bowel and/or bladder incontinence post epidural: No (02/21/23 1030)  Cierra Rothgeb L Dannette Kinkaid

## 2023-02-22 LAB — CBC
HCT: 28.5 % — ABNORMAL LOW (ref 36.0–46.0)
Hemoglobin: 9.3 g/dL — ABNORMAL LOW (ref 12.0–15.0)
MCH: 28.3 pg (ref 26.0–34.0)
MCHC: 32.6 g/dL (ref 30.0–36.0)
MCV: 86.6 fL (ref 80.0–100.0)
Platelets: 210 10*3/uL (ref 150–400)
RBC: 3.29 MIL/uL — ABNORMAL LOW (ref 3.87–5.11)
RDW: 15.3 % (ref 11.5–15.5)
WBC: 12 10*3/uL — ABNORMAL HIGH (ref 4.0–10.5)
nRBC: 0 % (ref 0.0–0.2)

## 2023-02-22 MED ORDER — FERROUS SULFATE 325 (65 FE) MG PO TABS
325.0000 mg | ORAL_TABLET | Freq: Every day | ORAL | Status: DC
  Administered 2023-02-23 – 2023-02-24 (×2): 325 mg via ORAL
  Filled 2023-02-22 (×2): qty 1

## 2023-02-22 NOTE — Progress Notes (Signed)
Subjective: Postpartum Day 1: Cesarean Delivery Patient reports incisional pain, tolerating PO, + flatus, and no problems voiding.  She denies HA, lightheadedness, CP or SOB. She has no complaints. Desires circumcision for baby  Objective: Vital signs in last 24 hours: Temp:  [97.6 F (36.4 C)-98.6 F (37 C)] 98.6 F (37 C) (06/14 0240) Pulse Rate:  [68-92] 77 (06/14 0240) Resp:  [16-20] 17 (06/14 0240) BP: (83-110)/(57-76) 110/57 (06/14 0240) SpO2:  [96 %-100 %] 99 % (06/14 0240)  Physical Exam:  General: alert, cooperative, and no distress Lochia: appropriate Uterine Fundus: firm Incision: no significant erythema DVT Evaluation: No evidence of DVT seen on physical exam.  Recent Labs    02/20/23 1000 02/22/23 0552  HGB 11.8* 9.3*  HCT 37.2 28.5*    Assessment/Plan: Status post Cesarean section. Doing well postoperatively.  Continue current care Pt understands that neonatal circumcision is not considered medically necessary and is elective. The risks include, but are not limited to bleeding, infection, damage to the penis, development of scar tissue, and having to have it redone at a later date. Pt understands theses risks and wishes to proceed.  Cathrine Muster, DO 02/22/2023, 11:03 AM

## 2023-02-22 NOTE — Lactation Note (Addendum)
This note was copied from a baby's chart. Lactation Consultation Note  Patient Name: [name] ZOXWR'U Date: 02/22/2023 Age:42 hours Reason for consult: Initial assessment;Early term 42-38.6wks Mom stated the baby latches for a little bit. LC assisted in getting baby latched. Baby would cue get on the breast well and suckle a few times then stop. Go to sleep. Then needed much stimulation to feed again. Baby was burping a lot and spitting up a lot as well. Sometimes out of his nose. Mom stated that has happened a few times all ready. Suggested mom hold baby up right for a little bit. Discussed mom pumping and mom stated she wanted to pump. Mom shown how to use DEBP & how to disassemble, clean, & reassemble parts. Mom encouraged to feed baby 8-12 times/24 hours and with feeding cues.  Newborn feeding habits, STS, I&O, positioning, support reviewed. Encouraged mom to call for assistance as needed.  Feeding plan: Mom wear shells in am to assist in reducing areola edema.  BF baby Pump q 3 hr for supplementation and stimulation Give baby back BM before formula.   Maternal Data Has patient been taught Hand Expression?: Yes Does the patient have breastfeeding experience prior to this delivery?: Yes How long did the patient breastfeed?: oldest 42 yrs old youngest 85 yrs old. mom BF from 6 months, 8 months, 1 yr.  Feeding Mother's Current Feeding Choice: Breast Milk and Formula Nipple Type: Nfant Slow Flow (purple)  LATCH Score Latch: Repeated attempts needed to sustain latch, nipple held in mouth throughout feeding, stimulation needed to elicit sucking reflex.  Audible Swallowing: None  Type of Nipple: Everted at rest and after stimulation  Comfort (Breast/Nipple): Filling, red/small blisters or bruises, mild/mod discomfort (slight areola edema)  Hold (Positioning): Assistance needed to correctly position infant at breast and maintain latch.  LATCH Score: 5   Lactation Tools  Discussed/Used Tools: Shells;Pump;Flanges Flange Size: 24 Breast pump type: Double-Electric Breast Pump Pump Education: Setup, frequency, and cleaning;Milk Storage Reason for Pumping: supplementation Pumping frequency: q 3hr Pumped volume: 7 mL  Interventions Interventions: Breast feeding basics reviewed;Adjust position;DEBP;Assisted with latch;Support pillows;Skin to skin;Position options;Breast massage;Pace feeding;Hand express;LC Psychologist, educational;Shells;Breast compression;Reverse pressure  Discharge    Consult Status Consult Status: Follow-up Date: 02/23/23 Follow-up type: In-patient    Charyl Dancer 02/22/2023, 9:41 PM

## 2023-02-22 NOTE — Social Work (Signed)
MOB was referred for history of depression/anxiety.  * Referral screened out by Clinical Social Worker because none of the following criteria appear to apply:  ~ History of anxiety/depression during this pregnancy, or of post-partum depression following prior delivery. Per OB records no concerns noted during this pregnancy.  ~ Diagnosis of anxiety and/or depression within last 3 years Per chart review MOB diagnosis dates back to 2016. OR * MOB's symptoms currently being treated with medication and/or therapy.  Please contact the Clinical Social Worker if needs arise, or by MOB request.   Wende Neighbors, LCSWA Clinical Social Worker [phone]

## 2023-02-23 NOTE — Lactation Note (Signed)
This note was copied from a baby's chart. Lactation Consultation Note  Patient Name: [name] Date: 02/23/2023 Age:42 hours Reason for consult: Follow-up assessment;RN request  LC into room for follow up. Parent seems extremely exhausted and fell asleep during [facility] consult. Encouraged parent and RN to contact [facility] when it is a better time for consult.   Feeding Mother's Current Feeding Choice: Breast Milk and Formula Nipple Type: Nfant Slow Flow (purple)  Consult Status Consult Status: Follow-up Date: 02/23/23 Follow-up type: In-patient    [name] [name] 02/23/2023, 10:51 AM

## 2023-02-23 NOTE — Lactation Note (Signed)
This note was copied from a baby's chart. Lactation Consultation Note  Patient Name: [name] ZOXWR'U Date: 02/23/2023 Age:42 hours Reason for consult: Follow-up assessment;Early term 37-38.6wks  Follow up with 55h old. Birthing parent requests assistance with pumping. Reviewed hand expression, breast milk is easily expressed. Used manual pump, collected ~15-mL plus 40 with DEBP. Infant was able to transferred 40mL via paced bottlefeeding with yellow Enfamil nipple. Encouraged to place skin to skin for 10-15 minutes.   Discussed normal infant behavior and patterns, signs of good milk transfer, hunger cues, tummy size and benefits of skin to skin.  Employee pump delivered to parent. Plan: 1-Breastfeeding on demand or 8-12 times in 24h period. 2-Pump with DEBP or manual as needed 3-Encouraged birthing parent rest, hydration and food intake.   Contact LC as needed for feeds/support/concerns/questions. All questions answered at this time.   Maternal Data Has patient been taught Hand Expression?: Yes Does the patient have breastfeeding experience prior to this delivery?: Yes How long did the patient breastfeed?: >6 months x3  Feeding Mother's Current Feeding Choice: Breast Milk and Formula Nipple Type: Slow - flow  LATCH Score No latch observed during this encounter.   Lactation Tools Discussed/Used Tools: Pump;Flanges Flange Size: 24 Breast pump type: Double-Electric Breast Pump;Manual Pump Education: Setup, frequency, and cleaning;Milk Storage Reason for Pumping: ETI Pumping frequency: every 3h Pumped volume: 45 mL  Interventions Interventions: Breast feeding basics reviewed;Skin to skin;Hand express;Breast massage;Expressed milk;DEBP;Education;Hand pump;Pace feeding  Discharge Discharge Education: Engorgement and breast care;Warning signs for feeding baby Pump: Personal;DEBP;Manual;Employee Pump WIC Program: No  Consult Status Consult Status: Follow-up Date:  02/24/23 Follow-up type: In-patient    [name] 02/23/2023, 3:21 PM

## 2023-02-23 NOTE — Progress Notes (Signed)
Subjective: Postpartum Day 2: Cesarean Delivery Patient reports incisional pain, tolerating PO, + flatus, and no problems voiding.  She denies HA, lightheadedness, CP or SOB. Some gas pressure, infgant with some weight loss, working on feeding  Objective: Vital signs in last 24 hours: Temp:  [98.1 F (36.7 C)-98.3 F (36.8 C)] 98.1 F (36.7 C) (06/15 0624) Pulse Rate:  [85-91] 91 (06/15 0624) Resp:  [17-18] 17 (06/15 0624) BP: (105-122)/(70-74) 122/74 (06/15 0624) SpO2:  [97 %] 97 % (06/15 1610)  Physical Exam:  General: alert, cooperative, and no distress Lochia: appropriate Uterine Fundus: firm Incision: no significant erythema DVT Evaluation: No evidence of DVT seen on physical exam.  Recent Labs    02/20/23 1000 02/22/23 0552  HGB 11.8* 9.3*  HCT 37.2 28.5*     Assessment/Plan: Status post Cesarean section. Doing well postoperatively.  Continue current care Plan for discharge tomorrow to encourage ambulation, passage of more flatus and to work on feeding/infant weight. All questions answered  Ebony Cater, MD 02/23/2023, 9:17 AM

## 2023-02-24 MED ORDER — IBUPROFEN 800 MG PO TABS: 800.0000 mg | ORAL_TABLET | Freq: Three times a day (TID) | ORAL | 1 refills | Status: DC | PRN

## 2023-02-24 MED ORDER — OXYCODONE HCL 5 MG PO TABS: 5.0000 mg | ORAL_TABLET | Freq: Four times a day (QID) | ORAL | 0 refills | Status: DC | PRN

## 2023-02-24 NOTE — Progress Notes (Signed)
Subjective: Postpartum Day 3: Cesarean Delivery Patient reports incisional pain, tolerating PO, + flatus, and no problems voiding.  She denies HA, lightheadedness, CP or SOB. Some gas pressure, infant with some weight loss, working on feeding  Objective: Vital signs in last 24 hours: Temp:  [97.8 F (36.6 C)-98.1 F (36.7 C)] 98.1 F (36.7 C) (06/16 0624) Pulse Rate:  [86-100] 86 (06/16 0624) Resp:  [18] 18 (06/16 0624) BP: (97-113)/(70-81) 97/80 (06/16 0624) SpO2:  [33 %] 33 % (06/15 1516)  Physical Exam:  General: alert, cooperative, and no distress Lochia: appropriate Uterine Fundus: firm Incision: no significant erythema DVT Evaluation: No evidence of DVT seen on physical exam.  Recent Labs    02/22/23 0552  HGB 9.3*  HCT 28.5*     Assessment/Plan: Status post Cesarean section. Doing well postoperatively.  Continue current care Plan for discharge today with 2wk incision check and routine pp  Carlisle Cater, MD 02/24/2023, 9:30 AM

## 2023-02-24 NOTE — Discharge Summary (Signed)
Postpartum Discharge Summary  Date of Service updated     Patient Name: [name] DOB: April 29, 1981 MRN: [id number]  Date of admission: 02/21/2023 Delivery date:02/21/2023  Delivering provider: Jackelyn Knife, TODD  Date of discharge: 02/24/2023  Admitting diagnosis: S/P cesarean section [Z98.891] Maternal care due to low transverse uterine scar from previous cesarean delivery [O34.211] Intrauterine pregnancy: [redacted]w[redacted]d     Secondary diagnosis:  Principal Problem:   S/P cesarean section Active Problems:   Maternal care due to low transverse uterine scar from previous cesarean delivery  Additional problems: BMI 49    Discharge diagnosis: Term Pregnancy Delivered                                              Post partum procedures: none Augmentation: N/A Complications: None  Hospital course: Sceduled C/S   42 y.o. yo [id number] at [redacted]w[redacted]d was admitted to the hospital 02/21/2023 for scheduled cesarean section with the following indication:Elective Repeat.Delivery details are as follows:  Membrane Rupture Time/Date: 8:07 AM ,02/21/2023   Delivery Method:C-Section, Low Transverse  Details of operation can be found in separate operative note.  Patient had a postpartum course complicated bynone.  She is ambulating, tolerating a regular diet, passing flatus, and urinating well. Patient is discharged home in stable condition on  02/24/23        Newborn Data: Birth date:02/21/2023  Birth time:8:08 AM  Gender:Female  Living status:Living  Apgars:9 ,9  Weight:2730 g      Physical exam  Vitals:   02/23/23 0624 02/23/23 1516 02/23/23 2205 02/24/23 0624  BP: 122/74 105/70 113/81 97/80  Pulse: 91 90 100 86  Resp: 17  18 18   Temp: 98.1 F (36.7 C) 97.9 F (36.6 C) 97.8 F (36.6 C) 98.1 F (36.7 C)  TempSrc: Oral Oral Oral Oral  SpO2: 97% (!) 33%    Weight:      Height:       General: alert, cooperative, and no distress Lochia: appropriate Uterine Fundus: firm Incision: Healing well with no  significant drainage, No significant erythema, Dressing is clean, dry, and intact DVT Evaluation: No evidence of DVT seen on physical exam. Negative Homan's sign. No cords or calf tenderness. Labs: Lab Results  Component Value Date   WBC 12.0 (H) 02/22/2023   HGB 9.3 (L) 02/22/2023   HCT 28.5 (L) 02/22/2023   MCV 86.6 02/22/2023   PLT 210 02/22/2023      Latest Ref Rng & Units 01/16/2023    1:52 AM  CMP  Glucose 70 - 99 mg/dL 83   BUN 6 - 20 mg/dL 10   Creatinine 1.47 - 1.00 mg/dL 8.29   Sodium 562 - 130 mmol/L 132   Potassium 3.5 - 5.1 mmol/L 3.4   Chloride 98 - 111 mmol/L 104   CO2 22 - 32 mmol/L 21   Calcium 8.9 - 10.3 mg/dL 8.6    Edinburgh Score:    02/21/2023   10:30 AM  Edinburgh Postnatal Depression Scale Screening Tool  I have been able to laugh and see the funny side of things. 0  I have looked forward with enjoyment to things. 0  I have blamed myself unnecessarily when things went wrong. 2  I have been anxious or worried for no good reason. 2  I have felt scared or panicky for no good reason. 1  Things have  been getting on top of me. 2  I have been so unhappy that I have had difficulty sleeping. 0  I have felt sad or miserable. 0  I have been so unhappy that I have been crying. 0  The thought of harming myself has occurred to me. 0  Edinburgh Postnatal Depression Scale Total 7      After visit meds:  Allergies as of 02/24/2023   No Known Allergies      Medication List     STOP taking these medications    aspirin EC 81 MG tablet   Norgestimate-Ethinyl Estradiol Triphasic 0.18/0.215/0.25 MG-25 MCG tab Commonly known as: Tri-Lo-Sprintec   traZODone 100 MG tablet Commonly known as: DESYREL       TAKE these medications    ibuprofen 800 MG tablet Commonly known as: ADVIL Take 1 tablet (800 mg total) by mouth every 8 (eight) hours as needed.   oxyCODONE 5 MG immediate release tablet Commonly known as: Oxy IR/ROXICODONE Take 1 tablet (5 mg  total) by mouth every 6 (six) hours as needed for severe pain.   sertraline 50 MG tablet Commonly known as: Zoloft Take 1 tablet (50 mg total) by mouth daily.         Discharge home in stable condition Infant Feeding: Bottle and Breast Infant Disposition:home with mother Discharge instruction: per After Visit Summary and Postpartum booklet. Activity: Advance as tolerated. Pelvic rest for 6 weeks.  Diet: routine diet Anticipated Birth Control: Unsure Postpartum Appointment:6 weeks Additional Postpartum F/U: Incision check 1 week Follow up Visit: [facility]    02/24/2023 Carlisle Cater, MD

## 2023-02-24 NOTE — Lactation Note (Signed)
This note was copied from a baby's chart. Lactation Consultation Note  Patient Name: [name] [id number] Date: 02/24/2023 Age:42 hours Reason for consult: Follow-up assessment;Early term 37-38.6wks  LC in to room, parent is expecting discharge. Infant just transferred ~40 mL of expressed breast milk. Birthing parent states she is still leaking. Discussed pumping until breasts are empty (no more that 30 minutes). LC provided storage bottles to milk and reviewed volume guidelines when bottlefeeding. Discussed normal behavior and patterns after 24h, voids and stools as signs good intake, pumping, clusterfeeding, skin to skin. Talked about milk coming into volume and managing engorgement.   Plan: 1-Feeding on demand or 8-12 times in 24h period. 2-Hand express/pump as needed for supplementation 3-Encouraged birthing parent rest, hydration and food intake.   Contact LC as needed for feeds/support/concerns/questions. All questions answered at this time.   Feeding Mother's Current Feeding Choice: Breast Milk and Formula Nipple Type: Slow - flow  LATCH Score No latch observed during this encounter.    Lactation Tools Discussed/Used Tools: Pump;Coconut oil Breast pump type: Double-Electric Breast Pump;Manual Reason for Pumping: ETI, stimulation and supplementation Pumping frequency: every 3h Pumped volume: 40 mL  Interventions Interventions: Breast feeding basics reviewed;Skin to skin;Expressed milk;Hand pump;DEBP;Education;Pace feeding;LC Services brochure  Discharge Discharge Education: Engorgement and breast care;Warning signs for feeding baby Pump: Employee Pump;DEBP;Manual  Consult Status Consult Status: Complete Date: 02/24/23 Follow-up type: Call as needed    Nixon Kolton [name] 02/24/2023, 10:28 AM

## 2023-02-26 LAB — BIRTH TISSUE RECOVERY COLLECTION (PLACENTA DONATION)

## 2023-03-11 ENCOUNTER — Telehealth (HOSPITAL_COMMUNITY): Payer: Self-pay | Admitting: *Deleted

## 2023-03-11 NOTE — Telephone Encounter (Signed)
03/11/2023  Name: [name] MRN: [id number] DOB: 10-07-1980  Reason for Call:  Transition of Care Hospital Discharge Call  Contact Status: Patient Contact Status: Complete  Language assistant needed:          Follow-Up Questions: Do You Have Any Concerns About Your Health As You Heal From Delivery?: No Do You Have Any Concerns About Your Infants Health?: No Patient requested RN email lactation message line and outpatient number. Email sent. Edinburgh Postnatal Depression Scale:  In the Past 7 Days:   EPDS not done at this time. Patient stated, "I've done that at the pediatrician, and at my 2week OB appointment. I also did it with the nurse that came out to the house. I'm feeling fine." PHQ2-9 Depression Scale:     Discharge Follow-up: Edinburgh score requires follow up?: N/A Patient was advised of the following resources:: Breastfeeding Support Group, Support Group  Post-discharge interventions: Reviewed Newborn Safe Sleep Practices  Signature Deforest Hoyles, RN, [phone]

## 2023-04-05 DIAGNOSIS — Z124 Encounter for screening for malignant neoplasm of cervix: Secondary | ICD-10-CM | POA: Diagnosis not present

## 2023-04-05 DIAGNOSIS — Z30011 Encounter for initial prescription of contraceptive pills: Secondary | ICD-10-CM | POA: Diagnosis not present

## 2023-04-05 DIAGNOSIS — Z1151 Encounter for screening for human papillomavirus (HPV): Secondary | ICD-10-CM | POA: Diagnosis not present

## 2023-05-02 ENCOUNTER — Telehealth: Payer: Self-pay

## 2023-05-02 NOTE — Telephone Encounter (Signed)
Copied from CRM [id number]. Topic: General - Other >> May 02, 2023 10:28 AM Clide Dales wrote: Patient would like a callback from [facility] to discuss a letter that is needed to help patient get financial assistance for medical bills. Please follow up with patient.

## 2023-05-02 NOTE — Telephone Encounter (Signed)
Please review.  You have not seen her  since 05/2022.  (She has been followed by OB/GYN)   Thanks,   -Vernona Rieger

## 2023-05-02 NOTE — Telephone Encounter (Signed)
Pt's voicemail is full.  PEC please schedule pt an appointment with Rene Kocher when she calls back.   Thanks,   -Vernona Rieger

## 2023-05-02 NOTE — Telephone Encounter (Signed)
She would need an appt.

## 2023-05-14 ENCOUNTER — Encounter: Payer: Self-pay | Admitting: Internal Medicine

## 2023-05-15 NOTE — Telephone Encounter (Signed)
Copied from CRM [id number]. Topic: Medical Record Request - Other >> May 15, 2023  9:20 AM Marlow Baars wrote: Reason for CRM: Janelle with [employer] called to make sure th e provider is good with the wording of the statement faxed over yesterday. If there are any questions or concerns please call her.

## 2023-05-27 ENCOUNTER — Other Ambulatory Visit: Payer: Self-pay

## 2023-05-27 ENCOUNTER — Other Ambulatory Visit (HOSPITAL_COMMUNITY): Payer: Self-pay

## 2023-05-27 MED ORDER — JUNEL FE 24 1-20 MG-MCG(24) PO TABS
1.0000 | ORAL_TABLET | Freq: Every day | ORAL | 3 refills | Status: DC
  Filled 2023-05-27 (×2): qty 84, 63d supply, fill #0
  Filled 2023-08-01: qty 84, 63d supply, fill #1
  Filled 2023-10-15: qty 84, 72d supply, fill #2

## 2023-05-31 ENCOUNTER — Telehealth: Payer: Self-pay

## 2023-05-31 NOTE — Telephone Encounter (Signed)
Copied from CRM [id number]. Topic: General - Inquiry >> May 31, 2023 12:14 PM Patsy Lager T wrote: Reason for CRM: Heloise Purpura Attorney from [employer] called again to f/u on the form that was recvd on 9/3. She is requesting provider complete and send back so they can settle with patient asap. Please f/u with Harriett Sine or fax to [phone]

## 2023-05-31 NOTE — Telephone Encounter (Signed)
I am not sure what to do about this Morrie Sheldon.  This while you are has composed a letter that they would like me to sign that essentially says that I believe that the pharmacist gave the patient the wrong dose of medication and the overdose caused her to have suicidal thoughts.  I have discussed with the patient that I am not comfortable filling this out.  There is no need for them continuing to call.

## 2023-06-03 NOTE — Telephone Encounter (Signed)
Called and left [name] a message to call back.

## 2023-06-13 DIAGNOSIS — Z713 Dietary counseling and surveillance: Secondary | ICD-10-CM | POA: Diagnosis not present

## 2023-06-13 DIAGNOSIS — Z6841 Body Mass Index (BMI) 40.0 and over, adult: Secondary | ICD-10-CM | POA: Diagnosis not present

## 2023-06-24 DIAGNOSIS — Z713 Dietary counseling and surveillance: Secondary | ICD-10-CM | POA: Diagnosis not present

## 2023-06-24 DIAGNOSIS — Z6841 Body Mass Index (BMI) 40.0 and over, adult: Secondary | ICD-10-CM | POA: Diagnosis not present

## 2023-06-25 ENCOUNTER — Other Ambulatory Visit (HOSPITAL_COMMUNITY): Payer: Self-pay

## 2023-06-25 ENCOUNTER — Encounter (HOSPITAL_COMMUNITY): Payer: Self-pay

## 2023-06-25 MED ORDER — ZEPBOUND 2.5 MG/0.5ML ~~LOC~~ SOAJ
2.5000 mg | SUBCUTANEOUS | 1 refills | Status: DC
  Filled 2023-06-25: qty 2, 28d supply, fill #0

## 2023-06-28 ENCOUNTER — Other Ambulatory Visit (HOSPITAL_COMMUNITY): Payer: Self-pay

## 2023-06-28 MED ORDER — WEGOVY 0.5 MG/0.5ML ~~LOC~~ SOAJ
0.5000 mg | SUBCUTANEOUS | 1 refills | Status: DC
  Filled 2023-06-28: qty 1, 28d supply, fill #0
  Filled 2023-07-02 – 2023-07-25 (×3): qty 2, 28d supply, fill #0

## 2023-07-02 ENCOUNTER — Other Ambulatory Visit (HOSPITAL_COMMUNITY): Payer: Self-pay

## 2023-07-05 DIAGNOSIS — Z713 Dietary counseling and surveillance: Secondary | ICD-10-CM | POA: Diagnosis not present

## 2023-07-05 DIAGNOSIS — Z6841 Body Mass Index (BMI) 40.0 and over, adult: Secondary | ICD-10-CM | POA: Diagnosis not present

## 2023-07-05 DIAGNOSIS — E66813 Obesity, class 3: Secondary | ICD-10-CM | POA: Diagnosis not present

## 2023-07-10 ENCOUNTER — Telehealth (HOSPITAL_COMMUNITY): Payer: Self-pay | Admitting: *Deleted

## 2023-07-10 NOTE — Telephone Encounter (Signed)
No sign statements but we can send the records to the attorney after the permission of the patient.

## 2023-07-10 NOTE — Telephone Encounter (Signed)
Writer received VM from [place] at [employer] requesting a Provider Statement regarding pt "overdose' of Wegovy. Pt was seen initially on 04/11/22 after inpt stay at [facility] for SI. F/u visits with you were on 05/02/22 and 07/04/22. Please review and advise.

## 2023-07-10 NOTE — Telephone Encounter (Signed)
We do have ROI. Will send records to attorney.

## 2023-07-12 ENCOUNTER — Other Ambulatory Visit (HOSPITAL_COMMUNITY): Payer: Self-pay

## 2023-07-25 ENCOUNTER — Encounter (HOSPITAL_COMMUNITY): Payer: Self-pay

## 2023-07-25 ENCOUNTER — Other Ambulatory Visit (HOSPITAL_COMMUNITY): Payer: Self-pay

## 2023-08-01 ENCOUNTER — Other Ambulatory Visit: Payer: Self-pay

## 2023-08-01 ENCOUNTER — Other Ambulatory Visit (HOSPITAL_COMMUNITY): Payer: Self-pay

## 2023-10-15 ENCOUNTER — Other Ambulatory Visit (HOSPITAL_COMMUNITY): Payer: Self-pay

## 2023-10-15 DIAGNOSIS — R252 Cramp and spasm: Secondary | ICD-10-CM | POA: Diagnosis not present

## 2023-10-15 MED ORDER — BACLOFEN 5 MG PO TABS
10.0000 mg | ORAL_TABLET | Freq: Three times a day (TID) | ORAL | 1 refills | Status: DC
  Filled 2023-10-15: qty 42, 7d supply, fill #0

## 2023-10-15 MED ORDER — PREDNISONE 20 MG PO TABS
ORAL_TABLET | ORAL | 0 refills | Status: AC
  Filled 2023-10-15: qty 9, 6d supply, fill #0

## 2023-11-20 ENCOUNTER — Other Ambulatory Visit (HOSPITAL_COMMUNITY): Payer: Self-pay

## 2023-11-20 ENCOUNTER — Other Ambulatory Visit: Payer: Self-pay | Admitting: Nurse Practitioner

## 2023-11-20 DIAGNOSIS — Z1159 Encounter for screening for other viral diseases: Secondary | ICD-10-CM | POA: Diagnosis not present

## 2023-11-20 DIAGNOSIS — Z136 Encounter for screening for cardiovascular disorders: Secondary | ICD-10-CM | POA: Diagnosis not present

## 2023-11-20 DIAGNOSIS — Z1231 Encounter for screening mammogram for malignant neoplasm of breast: Secondary | ICD-10-CM

## 2023-11-20 DIAGNOSIS — E669 Obesity, unspecified: Secondary | ICD-10-CM | POA: Diagnosis not present

## 2023-11-20 MED ORDER — NORGESTIMATE-ETH ESTRADIOL 0.18/0.215/0.25 MG-25 MCG PO TABS
1.0000 | ORAL_TABLET | Freq: Every day | ORAL | 4 refills | Status: AC
  Filled 2023-11-20: qty 84, 84d supply, fill #0
  Filled 2024-02-28: qty 84, 84d supply, fill #1
  Filled 2024-05-25: qty 84, 84d supply, fill #2
  Filled 2024-08-12: qty 84, 84d supply, fill #3

## 2023-11-21 ENCOUNTER — Encounter (HOSPITAL_BASED_OUTPATIENT_CLINIC_OR_DEPARTMENT_OTHER): Payer: Self-pay | Admitting: Emergency Medicine

## 2023-11-21 ENCOUNTER — Emergency Department (HOSPITAL_BASED_OUTPATIENT_CLINIC_OR_DEPARTMENT_OTHER)
Admission: EM | Admit: 2023-11-21 | Discharge: 2023-11-21 | Disposition: A | Discharge status: Home / Self Care | Attending: Emergency Medicine | Admitting: Emergency Medicine

## 2023-11-21 ENCOUNTER — Other Ambulatory Visit: Payer: Self-pay

## 2023-11-21 ENCOUNTER — Emergency Department (HOSPITAL_BASED_OUTPATIENT_CLINIC_OR_DEPARTMENT_OTHER)

## 2023-11-21 DIAGNOSIS — M25531 Pain in right wrist: Secondary | ICD-10-CM | POA: Insufficient documentation

## 2023-11-21 DIAGNOSIS — M7989 Other specified soft tissue disorders: Secondary | ICD-10-CM | POA: Diagnosis not present

## 2023-11-21 NOTE — Discharge Instructions (Signed)
 Please follow-up with your family doctor in the office.  Please return for redness or if you develop a fever.  Take 4 over the counter ibuprofen tablets 3 times a day or 2 over-the-counter naproxen tablets twice a day for pain. Also take tylenol 1000mg (2 extra strength) four times a day.

## 2023-11-21 NOTE — ED Triage Notes (Signed)
 Pt via pov from home with right wrist pian since yesterday. Denies known injury. She was stocking equipment (light boxes) but no unusual movement or falling. Pt alert & oriented, nad noted.

## 2023-11-21 NOTE — ED Provider Notes (Signed)
 [place] EMERGENCY DEPARTMENT AT [facility] Provider Note   CSN: [id number] Arrival date & time: 11/21/23  1100     History  Chief Complaint  Patient presents with   Wrist Pain    [name] is a 43 y.o. female.  43 yo F with a chief complaints of right wrist pain.  Started yesterday.  Feels like it is worse to the ulnar aspect of the wrist.  Worse with range of motion.  Feels like it swollen.  Extends down to the ulnar aspect of the left hand.  Denies trauma.  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   Wrist Pain       Home Medications Prior to Admission medications   Medication Sig Start Date End Date Taking? Authorizing Provider  Baclofen 5 MG TABS Take 2 tablets (10 mg total) by mouth 3 (three) times daily for 1 week 10/15/23     ibuprofen (ADVIL) 800 MG tablet Take 1 tablet (800 mg total) by mouth every 8 (eight) hours as needed. 02/24/23   Shivaji, Valerie Roys, MD  Norethindrone Acetate-Ethinyl Estrad-FE (JUNEL FE 24) 1-20 MG-MCG(24) tablet Take 1 tablet by mouth daily, skip placebo tablets 05/27/23     Norgestimate-Eth Estradiol (TRI-LO-MARZIA) 0.18/0.215/0.25 MG-25 MCG TABS Take 1 tablet by mouth daily. 11/20/23     oxyCODONE (OXY IR/ROXICODONE) 5 MG immediate release tablet Take 1 tablet (5 mg total) by mouth every 6 (six) hours as needed for severe pain. 02/24/23   Shivaji, Valerie Roys, MD  Semaglutide-Weight Management (WEGOVY) 0.5 MG/0.5ML SOAJ Inject 0.5 mg into the skin once a week. 06/28/23     sertraline (ZOLOFT) 50 MG tablet Take 1 tablet (50 mg total) by mouth daily. Patient not taking: Reported on 10/18/2022 07/04/22   Arfeen, Phillips Grout, MD  tirzepatide [facility]) 2.5 MG/0.5ML Pen Inject 2.5 mg into the skin once a week. 06/24/23         Allergies    Patient has no known allergies.    Review of Systems   Review of Systems  Physical Exam Updated Vital Signs BP 125/70 (BP Location: Left Arm)   Pulse 88   Temp (!) 97.5 F (36.4 C)   Resp  18   Ht 5\' 4"  (1.626 m)   Wt 128.9 kg   LMP 11/14/2023 (Exact Date)   SpO2 98%   BMI 48.78 kg/m  Physical Exam Vitals and nursing note reviewed.  Constitutional:      General: She is not in acute distress.    Appearance: She is well-developed. She is not diaphoretic.  HENT:     Head: Normocephalic and atraumatic.  Eyes:     Pupils: Pupils are equal, round, and reactive to light.  Cardiovascular:     Rate and Rhythm: Normal rate and regular rhythm.     Heart sounds: No murmur heard.    No friction rub. No gallop.  Pulmonary:     Effort: Pulmonary effort is normal.     Breath sounds: No wheezing or rales.  Abdominal:     General: There is no distension.     Palpations: Abdomen is soft.     Tenderness: There is no abdominal tenderness.  Musculoskeletal:        General: No tenderness.     Cervical back: Normal range of motion and neck supple.     Comments: There is a mild amount of edema to the right wrist.  There is no obvious erythema or warmth.  I am able  to range the wrist with minimal discomfort.  She has some pain mostly along the ulnar aspect of the wrist.  No obvious break in the skin.  Pulse motor and sensation intact.  Skin:    General: Skin is warm and dry.  Neurological:     Mental Status: She is alert and oriented to person, place, and time.  Psychiatric:        Behavior: Behavior normal.     ED Results / Procedures / Treatments   Labs (all labs ordered are listed, but only abnormal results are displayed) Labs Reviewed - No data to display  EKG None  Radiology No results found.  Procedures Procedures    Medications Ordered in ED Medications - No data to display  ED Course/ Medical Decision Making/ A&P                                 Medical Decision Making Amount and/or Complexity of Data Reviewed Radiology: ordered.   43 yo F with a chief complaints of right wrist pain.  Going on since yesterday.  No injury but had to do quite a bit of  stocking yesterday.  I suspect this is likely a sprain.  Plain film of the right wrist independently interpreted by me without fracture.  Wrist brace for comfort.  PCP follow-up.  12:37 PM:  I have discussed the diagnosis/risks/treatment options with the patient and family.  Evaluation and diagnostic testing in the emergency department does not suggest an emergent condition requiring admission or immediate intervention beyond what has been performed at this time.  They will follow up with PCP. We also discussed returning to the ED immediately if new or worsening sx occur. We discussed the sx which are most concerning (e.g., sudden worsening pain, fever, inability to tolerate by mouth) that necessitate immediate return. Medications administered to the patient during their visit and any new prescriptions provided to the patient are listed below.  Medications given during this visit Medications - No data to display   The patient appears reasonably screen and/or stabilized for discharge and I doubt any other medical condition or other [facility] requiring further screening, evaluation, or treatment in the ED at this time prior to discharge.          Final Clinical Impression(s) / ED Diagnoses Final diagnoses:  Wrist pain, acute, right    Rx / DC Orders ED Discharge Orders     None         Melene Plan, DO 11/21/23 1237

## 2023-11-29 ENCOUNTER — Encounter: Payer: Self-pay | Admitting: Radiology

## 2023-11-29 ENCOUNTER — Ambulatory Visit
Admission: RE | Admit: 2023-11-29 | Discharge: 2023-11-29 | Disposition: A | Discharge status: Home / Self Care | Source: Ambulatory Visit | Attending: Nurse Practitioner | Admitting: Nurse Practitioner

## 2023-11-29 DIAGNOSIS — Z1231 Encounter for screening mammogram for malignant neoplasm of breast: Secondary | ICD-10-CM

## 2023-12-03 ENCOUNTER — Other Ambulatory Visit (HOSPITAL_COMMUNITY): Payer: Self-pay

## 2024-01-10 ENCOUNTER — Other Ambulatory Visit (HOSPITAL_COMMUNITY): Payer: Self-pay

## 2024-01-10 ENCOUNTER — Telehealth: Admitting: Physician Assistant

## 2024-01-10 ENCOUNTER — Other Ambulatory Visit: Payer: Self-pay

## 2024-01-10 DIAGNOSIS — U071 COVID-19: Secondary | ICD-10-CM | POA: Diagnosis not present

## 2024-01-10 MED ORDER — LIDOCAINE VISCOUS HCL 2 % MT SOLN
OROMUCOSAL | 0 refills | Status: DC
  Filled 2024-01-10: qty 100, 4d supply, fill #0

## 2024-01-10 MED ORDER — IBUPROFEN 800 MG PO TABS
800.0000 mg | ORAL_TABLET | Freq: Three times a day (TID) | ORAL | 0 refills | Status: AC | PRN
Start: 2024-01-10 — End: ?
  Filled 2024-01-10: qty 30, 10d supply, fill #0

## 2024-01-10 MED ORDER — FLUTICASONE PROPIONATE 50 MCG/ACT NA SUSP
2.0000 | Freq: Every day | NASAL | 0 refills | Status: AC
Start: 2024-01-10 — End: ?
  Filled 2024-01-10: qty 16, 30d supply, fill #0

## 2024-01-10 MED ORDER — MUCINEX SINUS-MAX 5-10-200-325 MG PO TABS
1.0000 | ORAL_TABLET | Freq: Four times a day (QID) | ORAL | 0 refills | Status: DC | PRN
  Filled 2024-01-10: qty 30, fill #0

## 2024-01-10 NOTE — Patient Instructions (Signed)
 [name], thank you for joining Ebony Kelp, PA-C for today's virtual visit.  While this provider is not your primary care provider (PCP), if your PCP is located in our provider database this encounter information will be shared with them immediately following your visit.   A [facility] MyChart account gives you access to today's visit and all your visits, tests, and labs performed at [facility] " click here if you don't have a [facility] MyChart account or go to [email or web address]  Consent: (Patient) [name] provided verbal consent for this virtual visit at the beginning of the encounter.  Current Medications:  Current Outpatient Medications:    fluticasone  (FLONASE ) 50 MCG/ACT nasal spray, Place 2 sprays into both nostrils daily., Disp: 16 g, Rfl: 0   ibuprofen  (ADVIL ) 800 MG tablet, Take 1 tablet (800 mg total) by mouth every 8 (eight) hours as needed., Disp: 30 tablet, Rfl: 0   lidocaine  (XYLOCAINE ) 2 % solution, Swallow 5mL every 4-6 hours as needed for throat, Disp: 100 mL, Rfl: 0   Phenylephrine -DM-GG-APAP (MUCINEX SINUS-MAX) 5-10-200-325 MG TABS, Take 1-2 tablets by mouth every 6 (six) hours as needed., Disp: 30 tablet, Rfl: 0   Baclofen  5 MG TABS, Take 2 tablets (10 mg total) by mouth 3 (three) times daily for 1 week, Disp: 42 tablet, Rfl: 1   Norethindrone  Acetate-Ethinyl Estrad-FE (JUNEL  FE 24) 1-20 MG-MCG(24) tablet, Take 1 tablet by mouth daily, skip placebo tablets, Disp: 84 tablet, Rfl: 3   Norgestimate -Eth Estradiol  (TRI-LO-MARZIA) 0.18/0.215/0.25 MG-25 MCG TABS, Take 1 tablet by mouth daily., Disp: 84 tablet, Rfl: 4   oxyCODONE  (OXY IR/ROXICODONE ) 5 MG immediate release tablet, Take 1 tablet (5 mg total) by mouth every 6 (six) hours as needed for severe pain., Disp: 28 tablet, Rfl: 0   Semaglutide -Weight Management (WEGOVY ) 0.5 MG/0.5ML SOAJ, Inject 0.5 mg into the skin once a week., Disp: 2 mL, Rfl: 1   sertraline  (ZOLOFT ) 50 MG tablet,  Take 1 tablet (50 mg total) by mouth daily. (Patient not taking: Reported on 10/18/2022), Disp: 90 tablet, Rfl: 0   tirzepatide  (ZEPBOUND ) 2.5 MG/0.5ML Pen, Inject 2.5 mg into the skin once a week., Disp: 2 mL, Rfl: 1   Medications ordered in this encounter:  Meds ordered this encounter  Medications   fluticasone  (FLONASE ) 50 MCG/ACT nasal spray    Sig: Place 2 sprays into both nostrils daily.    Dispense:  16 g    Refill:  0    Supervising Provider:   LAMPTEY, PHILIP O [[id number]]   lidocaine  (XYLOCAINE ) 2 % solution    Sig: Swallow 5mL every 4-6 hours as needed for throat    Dispense:  100 mL    Refill:  0    Supervising Provider:   Corine Dice [[id number]]   Phenylephrine -DM-GG-APAP (MUCINEX SINUS-MAX) 5-10-200-325 MG TABS    Sig: Take 1-2 tablets by mouth every 6 (six) hours as needed.    Dispense:  30 tablet    Refill:  0    Supervising Provider:   LAMPTEY, PHILIP O [[id number]]   ibuprofen  (ADVIL ) 800 MG tablet    Sig: Take 1 tablet (800 mg total) by mouth every 8 (eight) hours as needed.    Dispense:  30 tablet    Refill:  0    Supervising Provider:   Corine Dice [phone]     *If you need refills on other medications prior to your next appointment, please contact your pharmacy*  Follow-Up: Call back  or seek an in-person evaluation if the symptoms worsen or if the condition fails to improve as anticipated.  [facility] [phone]  Care Instructions: Can take to lessen severity: Vit C 500mg  twice daily Quercertin 250-500mg  twice daily Zinc 75-100mg  daily Melatonin 3-6 mg at bedtime Vit D3 1000-2000 IU daily Aspirin 81 mg daily with food Optional: Famotidine 20mg  daily Also can add tylenol /ibuprofen  as needed for fevers and body aches May add Mucinex or Mucinex DM as needed for cough/congestion    Isolation Instructions: You are to isolate at home until you have been fever free for at least 24 hours without a fever-reducing medication, and  symptoms have been steadily improving for 24 hours. At that time,  you can end isolation but need to mask for an additional 5 days.   If you must be around other household members who do not have symptoms, you need to make sure that both you and the family members are masking consistently with a high-quality mask.  If you note any worsening of symptoms despite treatment, please seek an in-person evaluation ASAP. If you note any significant shortness of breath or any chest pain, please seek ER evaluation. Please do not delay care!   COVID-19: What to Do if You Are Sick If you test positive and are an older adult or someone who is at high risk of getting very sick from COVID-19, treatment may be available. Contact a healthcare provider right away after a positive test to determine if you are eligible, even if your symptoms are mild right now. You can also visit a Test to Treat location and, if eligible, receive a prescription from a provider. Don't delay: Treatment must be started within the first few days to be effective. If you have a fever, cough, or other symptoms, you might have COVID-19. Most people have mild illness and are able to recover at home. If you are sick: Keep track of your symptoms. If you have an emergency warning sign (including trouble breathing), call 911. Steps to help prevent the spread of COVID-19 if you are sick If you are sick with COVID-19 or think you might have COVID-19, follow the steps below to care for yourself and to help protect other people in your home and community. Stay home except to get medical care Stay home. Most people with COVID-19 have mild illness and can recover at home without medical care. Do not leave your home, except to get medical care. Do not visit public areas and do not go to places where you are unable to wear a mask. Take care of yourself. Get rest and stay hydrated. Take over-the-counter medicines, such as acetaminophen , to help you feel  better. Stay in touch with your doctor. Call before you get medical care. Be sure to get care if you have trouble breathing, or have any other emergency warning signs, or if you think it is an emergency. Avoid public transportation, ride-sharing, or taxis if possible. Get tested If you have symptoms of COVID-19, get tested. While waiting for test results, stay away from others, including staying apart from those living in your household. Get tested as soon as possible after your symptoms start. Treatments may be available for people with COVID-19 who are at risk for becoming very sick. Don't delay: Treatment must be started early to be effective--some treatments must begin within 5 days of your first symptoms. Contact your healthcare provider right away if your test result is positive to determine if you  are eligible. Self-tests are one of several options for testing for the virus that causes COVID-19 and may be more convenient than laboratory-based tests and point-of-care tests. Ask your healthcare provider or your local health department if you need help interpreting your test results. You can visit your state, tribal, local, and territorial health department's website to look for the latest local information on testing sites. Separate yourself from other people As much as possible, stay in a specific room and away from other people and pets in your home. If possible, you should use a separate bathroom. If you need to be around other people or animals in or outside of the home, wear a well-fitting mask. Tell your close contacts that they may have been exposed to COVID-19. An infected person can spread COVID-19 starting 48 hours (or 2 days) before the person has any symptoms or tests positive. By letting your close contacts know they may have been exposed to COVID-19, you are helping to protect everyone. See COVID-19 and Animals if you have questions about pets. If you are diagnosed with COVID-19,  someone from the health department may call you. Answer the call to slow the spread. Monitor your symptoms Symptoms of COVID-19 include fever, cough, or other symptoms. Follow care instructions from your healthcare provider and local health department. Your local health authorities may give instructions on checking your symptoms and reporting information. When to seek emergency medical attention Look for emergency warning signs* for COVID-19. If someone is showing any of these signs, seek emergency medical care immediately: Trouble breathing Persistent pain or pressure in the chest New confusion Inability to wake or stay awake Pale, gray, or blue-colored skin, lips, or nail beds, depending on skin tone *This list is not all possible symptoms. Please call your medical provider for any other symptoms that are severe or concerning to you. Call 911 or call ahead to your local emergency facility: Notify the operator that you are seeking care for someone who has or may have COVID-19. Call ahead before visiting your doctor Call ahead. Many medical visits for routine care are being postponed or done by phone or telemedicine. If you have a medical appointment that cannot be postponed, call your doctor's office, and tell them you have or may have COVID-19. This will help the office protect themselves and other patients. If you are sick, wear a well-fitting mask You should wear a mask if you must be around other people or animals, including pets (even at home). Wear a mask with the best fit, protection, and comfort for you. You don't need to wear the mask if you are alone. If you can't put on a mask (because of trouble breathing, for example), cover your coughs and sneezes in some other way. Try to stay at least 6 feet away from other people. This will help protect the people around you. Masks should not be placed on young children under age 19 years, anyone who has trouble breathing, or anyone who is not  able to remove the mask without help. Cover your coughs and sneezes Cover your mouth and nose with a tissue when you cough or sneeze. Throw away used tissues in a lined trash can. Immediately wash your hands with soap and water  for at least 20 seconds. If soap and water  are not available, clean your hands with an alcohol-based hand sanitizer that contains at least 60% alcohol. Clean your hands often Wash your hands often with soap and water  for at least 20 seconds. This  is especially important after blowing your nose, coughing, or sneezing; going to the bathroom; and before eating or preparing food. Use hand sanitizer if soap and water  are not available. Use an alcohol-based hand sanitizer with at least 60% alcohol, covering all surfaces of your hands and rubbing them together until they feel dry. Soap and water  are the best option, especially if hands are visibly dirty. Avoid touching your eyes, nose, and mouth with unwashed hands. Handwashing Tips Avoid sharing personal household items Do not share dishes, drinking glasses, cups, eating utensils, towels, or bedding with other people in your home. Wash these items thoroughly after using them with soap and water  or put in the dishwasher. Clean surfaces in your home regularly Clean and disinfect high-touch surfaces (for example, doorknobs, tables, handles, light switches, and countertops) in your "sick room" and bathroom. In shared spaces, you should clean and disinfect surfaces and items after each use by the person who is ill. If you are sick and cannot clean, a caregiver or other person should only clean and disinfect the area around you (such as your bedroom and bathroom) on an as needed basis. Your caregiver/other person should wait as long as possible (at least several hours) and wear a mask before entering, cleaning, and disinfecting shared spaces that you use. Clean and disinfect areas that may have blood, stool, or body fluids on them. Use  household cleaners and disinfectants. Clean visible dirty surfaces with household cleaners containing soap or detergent. Then, use a household disinfectant. Use a product from [employer] List N: Disinfectants for Coronavirus (COVID-19). Be sure to follow the instructions on the label to ensure safe and effective use of the product. Many products recommend keeping the surface wet with a disinfectant for a certain period of time (look at "contact time" on the product label). You may also need to wear personal protective equipment, such as gloves, depending on the directions on the product label. Immediately after disinfecting, wash your hands with soap and water  for 20 seconds. For completed guidance on cleaning and disinfecting your home, visit Complete Disinfection Guidance. Take steps to improve ventilation at home Improve ventilation (air flow) at home to help prevent from spreading COVID-19 to other people in your household. Clear out COVID-19 virus particles in the air by opening windows, using air filters, and turning on fans in your home. Use this interactive tool to learn how to improve air flow in your home. When you can be around others after being sick with COVID-19 Deciding when you can be around others is different for different situations. Find out when you can safely end home isolation. For any additional questions about your care, contact your healthcare provider or state or local health department. 11/29/2020 Content source: [facility] for Immunization and Respiratory Diseases ([facility]), Division of Viral Diseases This information is not intended to replace advice given to you by your health care provider. Make sure you discuss any questions you have with your health care provider. Document Revised: 01/12/2021 Document Reviewed: 01/12/2021 Elsevier Patient Education  2022 [employer].     If you have been instructed to have an in-person evaluation today at a local Urgent Care  facility, please use the link below. It will take you to a list of all of our available [facility], including address, phone number and hours of operation. Please do not delay care.  [facility]  If you or a family member do not have a primary care provider, use  the link below to schedule a visit and establish care. When you choose a [facility] primary care physician or advanced practice provider, you gain a long-term partner in health. Find a Primary Care Provider  Learn more about [facility]'s in-office and virtual care options: [facility] - Get Care Now

## 2024-01-10 NOTE — Progress Notes (Signed)
 Virtual Visit Consent   [name], you are scheduled for a virtual visit with a [facility] provider today. Just as with appointments in the office, your consent must be obtained to participate. Your consent will be active for this visit and any virtual visit you may have with one of our providers in the next 365 days. If you have a MyChart account, a copy of this consent can be sent to you electronically.  As this is a virtual visit, video technology does not allow for your provider to perform a traditional examination. This may limit your provider's ability to fully assess your condition. If your provider identifies any concerns that need to be evaluated in person or the need to arrange testing (such as labs, EKG, etc.), we will make arrangements to do so. Although advances in technology are sophisticated, we cannot ensure that it will always work on either your end or our end. If the connection with a video visit is poor, the visit may have to be switched to a telephone visit. With either a video or telephone visit, we are not always able to ensure that we have a secure connection.  By engaging in this virtual visit, you consent to the provision of healthcare and authorize for your insurance to be billed (if applicable) for the services provided during this visit. Depending on your insurance coverage, you may receive a charge related to this service.  I need to obtain your verbal consent now. Are you willing to proceed with your visit today? [name] has provided verbal consent on 01/10/2024 for a virtual visit (video or telephone). [name], PA-C  Date: 01/10/2024 9:20 AM   Virtual Visit via Video Note   I, [name], connected with  [name]  ([id number], 43-03-1981) on 01/10/24 at  9:00 AM EDT by a video-enabled telemedicine application and verified that I am speaking with the correct person using two identifiers.  Location: Patient: Virtual Visit Location  Patient: Home Provider: Virtual Visit Location Provider: Home Office   I discussed the limitations of evaluation and management by telemedicine and the availability of in person appointments. The patient expressed understanding and agreed to proceed.    History of Present Illness: [name] is a 43 y.o. who identifies as a female who was assigned female at birth, and is being seen today for Covid 15.  HPI: URI  This is a new problem. The current episode started in the past 7 days (01/07/24). The problem has been unchanged. The maximum temperature recorded prior to her arrival was 101 - 101.9 F (101.9 and 102). The fever has been present for Less than 1 day. Associated symptoms include congestion, diarrhea, headaches, a plugged ear sensation, rhinorrhea (and post nasal drainage), sinus pain and a sore throat. Pertinent negatives include no coughing, ear pain, nausea, vomiting or wheezing. Associated symptoms comments: Myalgias, fatigue. She has tried NSAIDs and acetaminophen  (mucinex, dayquil, nyquil) for the symptoms. The treatment provided mild relief.   Works at [employer] so had exposure at work.  Problems:  Patient Active Problem List   Diagnosis Date Noted   S/P cesarean section 02/21/2023   Maternal care due to low transverse uterine scar from previous cesarean delivery 02/21/2023   Prediabetes 09/25/2021   Insomnia 09/25/2021   Hyperlipidemia 08/17/2015   Generalized anxiety disorder 12/23/2014   Morbid obesity (HCC)    Asthma 03/14/2012    Allergies: No Known Allergies Medications:  Current Outpatient Medications:  fluticasone  (FLONASE ) 50 MCG/ACT nasal spray, Place 2 sprays into both nostrils daily., Disp: 16 g, Rfl: 0   ibuprofen  (ADVIL ) 800 MG tablet, Take 1 tablet (800 mg total) by mouth every 8 (eight) hours as needed., Disp: 30 tablet, Rfl: 0   lidocaine  (XYLOCAINE ) 2 % solution, Swallow 5mL every 4-6 hours as needed for throat, Disp: 100 mL, Rfl: 0    Phenylephrine -DM-GG-APAP (MUCINEX SINUS-MAX) 5-10-200-325 MG TABS, Take 1-2 tablets by mouth every 6 (six) hours as needed., Disp: 30 tablet, Rfl: 0   Baclofen  5 MG TABS, Take 2 tablets (10 mg total) by mouth 3 (three) times daily for 1 week, Disp: 42 tablet, Rfl: 1   Norethindrone  Acetate-Ethinyl Estrad-FE (JUNEL  FE 24) 1-20 MG-MCG(24) tablet, Take 1 tablet by mouth daily, skip placebo tablets, Disp: 84 tablet, Rfl: 3   Norgestimate -Eth Estradiol  (TRI-LO-MARZIA) 0.18/0.215/0.25 MG-25 MCG TABS, Take 1 tablet by mouth daily., Disp: 84 tablet, Rfl: 4   oxyCODONE  (OXY IR/ROXICODONE ) 5 MG immediate release tablet, Take 1 tablet (5 mg total) by mouth every 6 (six) hours as needed for severe pain., Disp: 28 tablet, Rfl: 0   Semaglutide -Weight Management (WEGOVY ) 0.5 MG/0.5ML SOAJ, Inject 0.5 mg into the skin once a week., Disp: 2 mL, Rfl: 1   sertraline  (ZOLOFT ) 50 MG tablet, Take 1 tablet (50 mg total) by mouth daily. (Patient not taking: Reported on 10/18/2022), Disp: 90 tablet, Rfl: 0   tirzepatide  (ZEPBOUND ) 2.5 MG/0.5ML Pen, Inject 2.5 mg into the skin once a week., Disp: 2 mL, Rfl: 1  Observations/Objective: Patient is well-developed, well-nourished in no acute distress.  Resting comfortably at home.  Head is normocephalic, atraumatic.  No labored breathing.  Speech is clear and coherent with logical content.  Patient is alert and oriented at baseline.    Assessment and Plan: 1. COVID-19 (Primary) - fluticasone  (FLONASE ) 50 MCG/ACT nasal spray; Place 2 sprays into both nostrils daily.  Dispense: 16 g; Refill: 0 - lidocaine  (XYLOCAINE ) 2 % solution; Swallow 5mL every 4-6 hours as needed for throat  Dispense: 100 mL; Refill: 0 - Phenylephrine -DM-GG-APAP (MUCINEX SINUS-MAX) 5-10-200-325 MG TABS; Take 1-2 tablets by mouth every 6 (six) hours as needed.  Dispense: 30 tablet; Refill: 0 - ibuprofen  (ADVIL ) 800 MG tablet; Take 1 tablet (800 mg total) by mouth every 8 (eight) hours as needed.  Dispense:  30 tablet; Refill: 0  - Continue OTC symptomatic management of choice - Will send OTC vitamins and supplement information through AVS - Flonase , Ibuprofen  and Viscous Lidocaine  prescribed - Mucinex Sinus max recommended - Patient enrolled in MyChart symptom monitoring - Push fluids - Rest as needed - Discussed return precautions and when to seek in-person evaluation, sent via AVS as well   Follow Up Instructions: I discussed the assessment and treatment plan with the patient. The patient was provided an opportunity to ask questions and all were answered. The patient agreed with the plan and demonstrated an understanding of the instructions.  A copy of instructions were sent to the patient via MyChart unless otherwise noted below.    The patient was advised to call back or seek an in-person evaluation if the symptoms worsen or if the condition fails to improve as anticipated.    [name], PA-C

## 2024-01-10 NOTE — Progress Notes (Signed)
   Thank you for the details you included in the comment boxes. Those details are very helpful in determining the best course of treatment for you and help [place] to provide the best care. Because you have tested positive for Covid 19, we recommend that you schedule a Virtual Urgent Care video visit in order for the provider to better assess what is going on.  The provider will be able to give you a more accurate diagnosis and treatment plan if we can more freely discuss your symptoms and with the addition of a virtual examination.   If you change your visit to a video visit, we will Finian your insurance (similar to an office visit) and you will not be charged for this e-Visit. You will be able to stay at home and speak with the first available [facility] advanced practice provider. The link to do a video visit is in the drop down Menu tab of your Welcome screen in MyChart.    I have spent 5 minutes in review of e-visit questionnaire, review and updating patient chart, medical decision making and response to patient.   Margaretann Loveless, PA-C

## 2024-02-28 ENCOUNTER — Other Ambulatory Visit (HOSPITAL_COMMUNITY): Payer: Self-pay

## 2024-05-25 ENCOUNTER — Other Ambulatory Visit (HOSPITAL_COMMUNITY): Payer: Self-pay

## 2024-10-02 ENCOUNTER — Ambulatory Visit (INDEPENDENT_AMBULATORY_CARE_PROVIDER_SITE_OTHER): Admitting: Family Medicine

## 2024-10-02 ENCOUNTER — Other Ambulatory Visit (HOSPITAL_COMMUNITY)
Admission: RE | Admit: 2024-10-02 | Discharge: 2024-10-02 | Disposition: A | Source: Ambulatory Visit | Attending: Family Medicine | Admitting: Family Medicine

## 2024-10-02 VITALS — BP 110/75 | HR 91 | Temp 98.3°F | Ht 64.0 in | Wt 252.0 lb

## 2024-10-02 DIAGNOSIS — Z131 Encounter for screening for diabetes mellitus: Secondary | ICD-10-CM

## 2024-10-02 DIAGNOSIS — Z6841 Body Mass Index (BMI) 40.0 and over, adult: Secondary | ICD-10-CM

## 2024-10-02 DIAGNOSIS — H9311 Tinnitus, right ear: Secondary | ICD-10-CM | POA: Insufficient documentation

## 2024-10-02 DIAGNOSIS — Z7689 Persons encountering health services in other specified circumstances: Secondary | ICD-10-CM | POA: Diagnosis not present

## 2024-10-02 LAB — HEMOGLOBIN A1C
Hgb A1c MFr Bld: 5.5 % (ref 4.8–5.6)
Mean Plasma Glucose: 111.15 mg/dL

## 2024-10-02 NOTE — Progress Notes (Signed)
 "  New Patient Office Visit  Subjective    Patient ID: [name], female    DOB: [birth year]  Age: 44 y.o. MRN: [id number]  CC:  Chief Complaint  Patient presents with   Establish Care    Would like to talk about weight / new birth control Dr. HORACIO - OB/GYN - [facility]     HPI [name] presents to establish care. Pt is new to me.   She is taking OCP by her OB Dr Horacio.  No concerns today and would like to establish care.  Pt would like to lose weight. She would like to be 200 lbs.  She walks at work in the hospital, tries to eat right, drink water ; every now and again she would eat fast food. She would like to try the new Wegovy  pill.   Outpatient Encounter Medications as of 10/02/2024  Medication Sig   fluticasone  (FLONASE ) 50 MCG/ACT nasal spray Place 2 sprays into both nostrils daily.   ibuprofen  (ADVIL ) 800 MG tablet Take 1 tablet (800 mg total) by mouth every 8 (eight) hours as needed.   Norgestimate -Eth Estradiol  (TRI-LO-MARZIA) 0.18/0.215/0.25 MG-25 MCG TABS Take 1 tablet by mouth daily.   [DISCONTINUED] Baclofen  5 MG TABS Take 2 tablets (10 mg total) by mouth 3 (three) times daily for 1 week   [DISCONTINUED] lidocaine  (XYLOCAINE ) 2 % solution Swallow 5mL every 4-6 hours as needed for throat.   [DISCONTINUED] Norethindrone  Acetate-Ethinyl Estrad-FE (JUNEL  FE 24) 1-20 MG-MCG(24) tablet Take 1 tablet by mouth daily, skip placebo tablets   [DISCONTINUED] oxyCODONE  (OXY IR/ROXICODONE ) 5 MG immediate release tablet Take 1 tablet (5 mg total) by mouth every 6 (six) hours as needed for severe pain.   [DISCONTINUED] Phenylephrine -DM-GG-APAP (MUCINEX  SINUS-MAX) 5-10-200-325 MG TABS Take 1-2 tablets by mouth every 6 (six) hours as needed.   [DISCONTINUED] Semaglutide -Weight Management (WEGOVY ) 0.5 MG/0.5ML SOAJ Inject 0.5 mg into the skin once a week.   [DISCONTINUED] sertraline  (ZOLOFT ) 50 MG tablet Take 1 tablet (50 mg total) by mouth daily. (Patient  not taking: Reported on 10/18/2022)   [DISCONTINUED] tirzepatide  (ZEPBOUND ) 2.5 MG/0.5ML Pen Inject 2.5 mg into the skin once a week.   No facility-administered encounter medications on file as of 10/02/2024.    Past Medical History:  Diagnosis Date   Abnormal Pap smear November 06, 2009   Asthma    out grew it no inhaler   Dysplasia of cervix, low grade (CIN 1)    Generalized anxiety disorder 12/23/2014   H/O cystitis 11/06/00   H/O pyelonephritis 11/06/00   H/O rubella    H/O varicella    History of cesarean delivery 04/07/2012   2010/11/06 for breech    History of chlamydia 12/27/2000   History of induced abortion 2012-04-07   x2    History of PID 03/29/2006   Monilial vaginitis 08/2006   Obese    Pain pelvic November 06, 2000   Unexpected sudden death of infant April 07, 2012   11/06/10, SIDS at 3mos     Vaginal bleeding in pregnancy Nov 07, 2011    Past Surgical History:  Procedure Laterality Date   CESAREAN SECTION     CESAREAN SECTION  07/09/2012   Procedure: CESAREAN SECTION;  Surgeon: Rome Rigg, MD;  Location: [facility];  Service: Obstetrics;  Laterality: N/A;   CESAREAN SECTION N/A 02/21/2023   Procedure: CESAREAN SECTION;  Surgeon: Horacio Boas, MD;  Location: [facility];  Service: Obstetrics;  Laterality: N/A;   CYSTOSCOPY  07/09/2012   Procedure: CYSTOSCOPY;  Surgeon: Rome Rigg, MD;  Location: [facility];  Service: Obstetrics;  Laterality: N/A;   headaches  09/11/2011    during pregnancy    Family History  Problem Relation Age of Onset   Cancer Maternal Grandmother        cervical   Heart disease Maternal Grandfather    Hyperlipidemia Maternal Grandfather    Hypertension Maternal Grandfather    Diabetes Paternal Grandmother    Other Neg Hx    Breast cancer Neg Hx     Social History   Socioeconomic History   Marital status: Single    Spouse name: Not on file   Number of children: Not on file   Years of education: Not on file   Highest education level: Bachelor's degree (e.g., BA, AB, BS)   Occupational History   Not on file  Tobacco Use   Smoking status: Former   Smokeless tobacco: Never  Vaping Use   Vaping status: Never Used  Substance and Sexual Activity   Alcohol use: Not Currently    Alcohol/week: 0.0 standard drinks of alcohol   Drug use: Never   Sexual activity: Not Currently  Other Topics Concern   Not on file  Social History Narrative   Not on file   Social Drivers of Health   Tobacco Use: Medium Risk (10/02/2024)   Patient History    Smoking Tobacco Use: Former    Smokeless Tobacco Use: Never    Passive Exposure: Not on file  Financial Resource Strain: Medium Risk (10/02/2024)   Overall Financial Resource Strain (CARDIA)    Difficulty of Paying Living Expenses: Somewhat hard  Food Insecurity: Food Insecurity Present (10/02/2024)   Epic    Worried About Programme Researcher, Broadcasting/film/video in the Last Year: Sometimes true    Ran Out of Food in the Last Year: Sometimes true  Transportation Needs: No Transportation Needs (10/02/2024)   Epic    Lack of Transportation (Medical): No    Lack of Transportation (Non-Medical): No  Physical Activity: Sufficiently Active (10/02/2024)   Exercise Vital Sign    Days of Exercise per Week: 7 days    Minutes of Exercise per Session: 150+ min  Stress: No Stress Concern Present (10/02/2024)   [employer] of Occupational Health - Occupational Stress Questionnaire    Feeling of Stress: Not at all  Social Connections: Socially Isolated (10/02/2024)   Social Connection and Isolation Panel    Frequency of Communication with Friends and Family: More than three times a week    Frequency of Social Gatherings with Friends and Family: More than three times a week    Attends Religious Services: Patient declined    Database Administrator or Organizations: No    Attends Engineer, Structural: Not on file    Marital Status: Never married  Intimate Partner Violence: Not At Risk (02/21/2023)   Humiliation, Afraid, Rape, and Kick  questionnaire    Fear of Current or Ex-Partner: No    Emotionally Abused: No    Physically Abused: No    Sexually Abused: No  Depression (PHQ2-9): Medium Risk (10/02/2024)   Depression (PHQ2-9)    PHQ-2 Score: 5  Alcohol Screen: Not on file  Housing: High Risk (10/02/2024)   Epic    Unable to Pay for Housing in the Last Year: Yes    Number of Times Moved in the Last Year: 0    Homeless in the Last Year: No  Utilities: Not At Risk (02/21/2023)   [facility] Utilities  Threatened with loss of utilities: No  Health Literacy: Not on file    Review of Systems  Constitutional:        Weight gain  All other systems reviewed and are negative.       Objective    BP 110/75 (BP Location: Left Arm, Patient Position: Sitting, Cuff Size: Normal)   Pulse 91   Temp 98.3 F (36.8 C) (Oral)   Ht 5' 4 (1.626 m)   Wt 252 lb (114.3 kg)   LMP 09/04/2024 (Approximate)   SpO2 98%   Breastfeeding No   BMI 43.26 kg/m   Physical Exam Vitals and nursing note reviewed.  Constitutional:      Appearance: Normal appearance. She is normal weight.  HENT:     Head: Normocephalic and atraumatic.     Right Ear: External ear normal.     Left Ear: External ear normal.     Nose: Nose normal.     Mouth/Throat:     Mouth: Mucous membranes are moist.     Pharynx: Oropharynx is clear.  Eyes:     Conjunctiva/sclera: Conjunctivae normal.     Pupils: Pupils are equal, round, and reactive to light.  Cardiovascular:     Rate and Rhythm: Normal rate.     Pulses: Normal pulses.     Heart sounds: Normal heart sounds.  Pulmonary:     Effort: Pulmonary effort is normal.     Breath sounds: Normal breath sounds.  Abdominal:     General: Abdomen is flat. Bowel sounds are normal.  Skin:    General: Skin is warm.     Capillary Refill: Capillary refill takes less than 2 seconds.  Neurological:     General: No focal deficit present.     Mental Status: She is alert and oriented to person, place, and time. Mental  status is at baseline.  Psychiatric:        Mood and Affect: Mood normal.        Behavior: Behavior normal.        Thought Content: Thought content normal.        Judgment: Judgment normal.         Assessment & Plan:   Problem List Items Addressed This Visit   None  Encounter to establish care with new doctor  Screening for diabetes mellitus -     Hemoglobin A1c  BMI 40.0-44.9, adult (HCC) -     Hemoglobin A1c  Pt here for establish care. She would like to try Wegovy  pill. To check A1c today. No follow-ups on file.   Torrence CINDERELLA Barrier, MD   "

## 2024-10-05 ENCOUNTER — Other Ambulatory Visit: Payer: Self-pay

## 2024-10-05 ENCOUNTER — Encounter: Payer: Self-pay | Admitting: Family Medicine

## 2024-10-05 ENCOUNTER — Other Ambulatory Visit (HOSPITAL_COMMUNITY): Payer: Self-pay

## 2024-10-05 DIAGNOSIS — Z6841 Body Mass Index (BMI) 40.0 and over, adult: Secondary | ICD-10-CM

## 2024-10-05 MED ORDER — WEGOVY 1.5 MG PO TABS
1.5000 mg | ORAL_TABLET | Freq: Every day | ORAL | 0 refills | Status: AC
  Filled 2024-10-05 – 2024-10-06 (×3): qty 30, 30d supply, fill #0

## 2024-10-05 MED ORDER — WEGOVY 1.5 MG PO TABS: 1.5000 mg | ORAL_TABLET | Freq: Every day | ORAL | 0 refills | Status: DC

## 2024-10-05 NOTE — Addendum Note (Signed)
 Addended by: ONEITA JOSETTE CROME on: 10/05/2024 03:25 PM   Modules accepted: Orders

## 2024-10-06 ENCOUNTER — Other Ambulatory Visit (HOSPITAL_COMMUNITY): Payer: Self-pay

## 2024-10-06 ENCOUNTER — Other Ambulatory Visit: Payer: Self-pay

## 2024-10-06 ENCOUNTER — Ambulatory Visit: Payer: Self-pay | Admitting: Family Medicine
